# Patient Record
Sex: Male | Born: 1945 | Race: Black or African American | Hispanic: No | Marital: Married | Smoking: Former smoker
Health system: Southern US, Community
[De-identification: ages and names within clinical notes are randomized; demographics above are authoritative.]

## PROBLEM LIST (undated history)

## (undated) DIAGNOSIS — K5900 Constipation, unspecified: Secondary | ICD-10-CM

## (undated) DIAGNOSIS — I10 Essential (primary) hypertension: Secondary | ICD-10-CM

## (undated) DIAGNOSIS — M869 Osteomyelitis, unspecified: Secondary | ICD-10-CM

## (undated) DIAGNOSIS — R0602 Shortness of breath: Secondary | ICD-10-CM

## (undated) DIAGNOSIS — C801 Malignant (primary) neoplasm, unspecified: Secondary | ICD-10-CM

## (undated) DIAGNOSIS — T4145XA Adverse effect of unspecified anesthetic, initial encounter: Secondary | ICD-10-CM

## (undated) DIAGNOSIS — Z9889 Other specified postprocedural states: Secondary | ICD-10-CM

## (undated) DIAGNOSIS — M1990 Unspecified osteoarthritis, unspecified site: Secondary | ICD-10-CM

## (undated) DIAGNOSIS — N400 Enlarged prostate without lower urinary tract symptoms: Secondary | ICD-10-CM

## (undated) DIAGNOSIS — R011 Cardiac murmur, unspecified: Secondary | ICD-10-CM

## (undated) DIAGNOSIS — R112 Nausea with vomiting, unspecified: Secondary | ICD-10-CM

## (undated) DIAGNOSIS — E039 Hypothyroidism, unspecified: Secondary | ICD-10-CM

## (undated) DIAGNOSIS — E119 Type 2 diabetes mellitus without complications: Secondary | ICD-10-CM

## (undated) DIAGNOSIS — T8859XA Other complications of anesthesia, initial encounter: Secondary | ICD-10-CM

## (undated) HISTORY — PX: FRACTURE SURGERY: SHX138

## (undated) HISTORY — PX: JOINT REPLACEMENT: SHX530

## (undated) HISTORY — PX: COLONOSCOPY: SHX174

---

## 2004-09-07 ENCOUNTER — Ambulatory Visit: Payer: Self-pay | Admitting: Family Medicine

## 2004-09-08 ENCOUNTER — Ambulatory Visit: Payer: Self-pay | Admitting: Family Medicine

## 2004-09-09 ENCOUNTER — Ambulatory Visit: Payer: Self-pay | Admitting: *Deleted

## 2004-10-01 ENCOUNTER — Ambulatory Visit: Payer: Self-pay | Admitting: Family Medicine

## 2005-04-19 ENCOUNTER — Emergency Department: Payer: Self-pay | Admitting: Unknown Physician Specialty

## 2005-06-05 ENCOUNTER — Emergency Department: Payer: Self-pay | Admitting: Emergency Medicine

## 2007-05-31 ENCOUNTER — Ambulatory Visit: Payer: Self-pay | Admitting: Internal Medicine

## 2007-06-20 ENCOUNTER — Ambulatory Visit: Payer: Self-pay | Admitting: Unknown Physician Specialty

## 2007-06-30 ENCOUNTER — Ambulatory Visit: Payer: Self-pay | Admitting: Internal Medicine

## 2007-07-01 ENCOUNTER — Ambulatory Visit: Payer: Self-pay | Admitting: Internal Medicine

## 2007-07-03 ENCOUNTER — Ambulatory Visit: Payer: Self-pay | Admitting: Internal Medicine

## 2007-07-31 ENCOUNTER — Ambulatory Visit: Payer: Self-pay | Admitting: Internal Medicine

## 2007-08-31 ENCOUNTER — Ambulatory Visit: Payer: Self-pay | Admitting: Internal Medicine

## 2007-08-31 HISTORY — PX: MICROLARYNGOSCOPY WITH CO2 LASER AND EXCISION OF VOCAL CORD LESION: SHX5970

## 2007-10-01 ENCOUNTER — Ambulatory Visit: Payer: Self-pay | Admitting: Internal Medicine

## 2007-10-29 ENCOUNTER — Ambulatory Visit: Payer: Self-pay | Admitting: Radiation Oncology

## 2007-11-06 ENCOUNTER — Ambulatory Visit: Payer: Self-pay | Admitting: Radiation Oncology

## 2007-11-29 ENCOUNTER — Ambulatory Visit: Payer: Self-pay | Admitting: Radiation Oncology

## 2007-12-29 ENCOUNTER — Ambulatory Visit: Payer: Self-pay | Admitting: Radiation Oncology

## 2008-02-28 ENCOUNTER — Ambulatory Visit: Payer: Self-pay | Admitting: Radiation Oncology

## 2008-06-10 ENCOUNTER — Ambulatory Visit: Payer: Self-pay | Admitting: Radiation Oncology

## 2008-06-30 ENCOUNTER — Ambulatory Visit: Payer: Self-pay | Admitting: Radiation Oncology

## 2010-01-08 ENCOUNTER — Emergency Department: Payer: Self-pay | Admitting: Emergency Medicine

## 2010-07-08 ENCOUNTER — Ambulatory Visit: Payer: Self-pay | Admitting: Unknown Physician Specialty

## 2010-07-15 ENCOUNTER — Emergency Department: Payer: Self-pay | Admitting: Emergency Medicine

## 2010-11-08 ENCOUNTER — Observation Stay: Payer: Self-pay | Admitting: Internal Medicine

## 2011-11-08 IMAGING — CR DG ABDOMEN 3V
1 series · 4 of 4 positions shown · non-contrast
Comparison: none

REASON FOR EXAM: abdominal pain
COMMENTS:

[Series 1: view not recorded · 0.17mm/px · 4 of 4 slices shown]
[im 1/4]
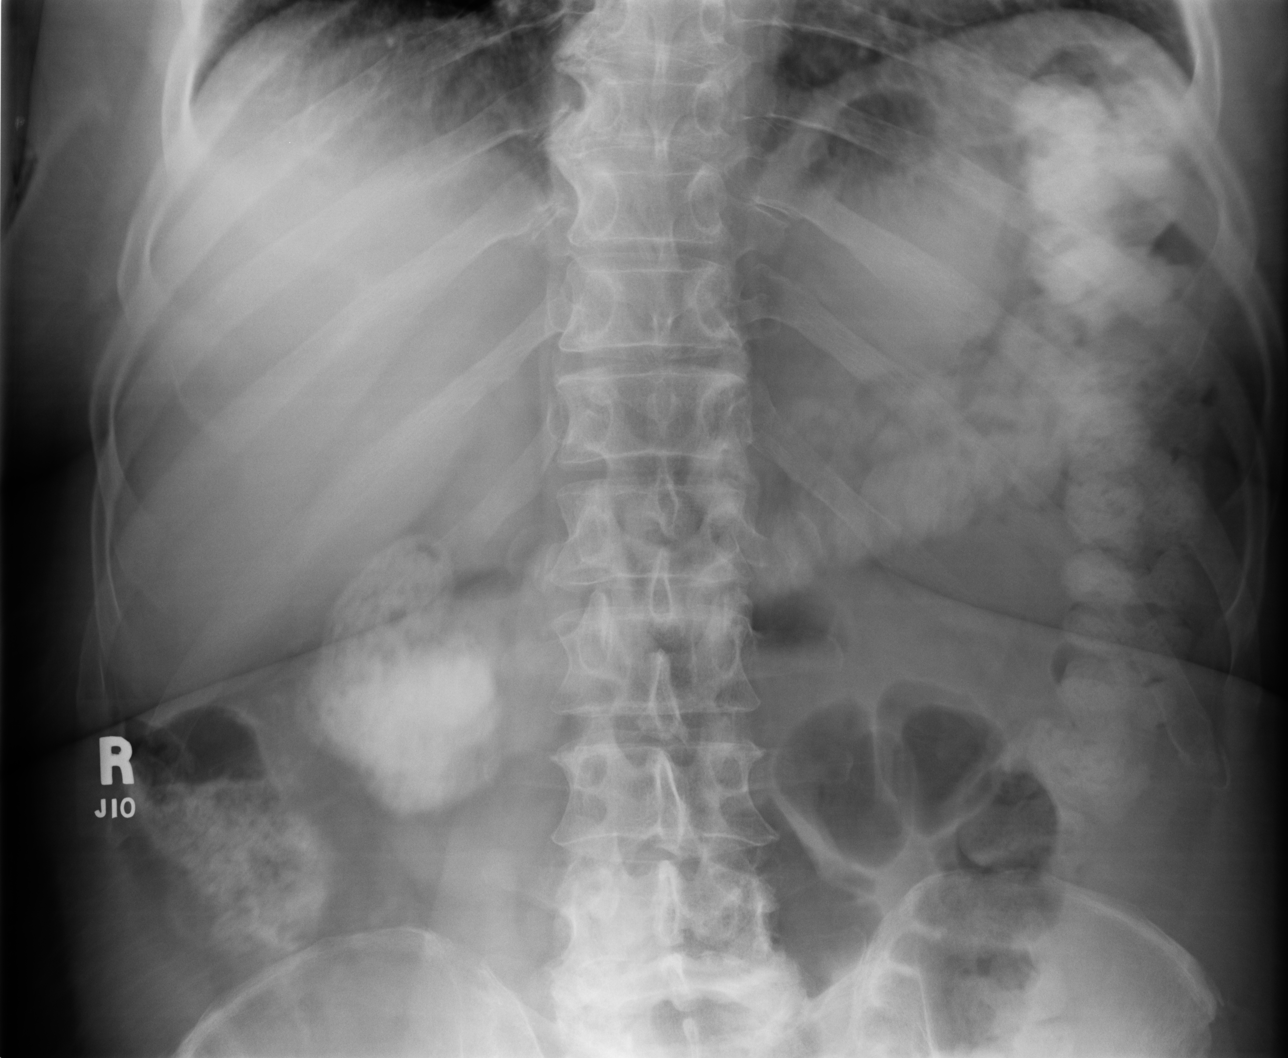
[im 2/4]
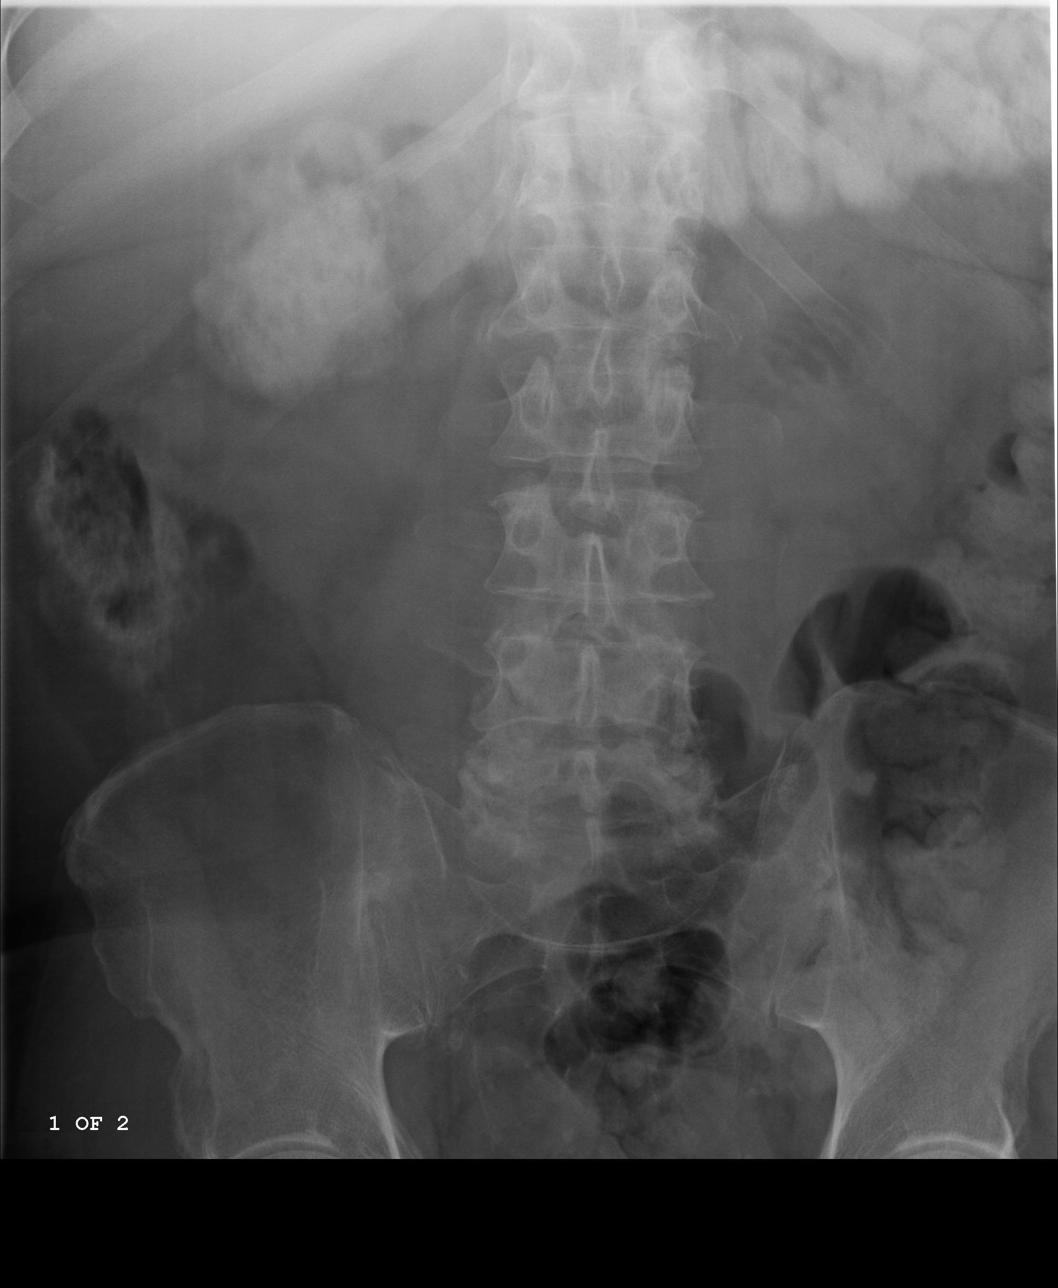
[im 3/4]
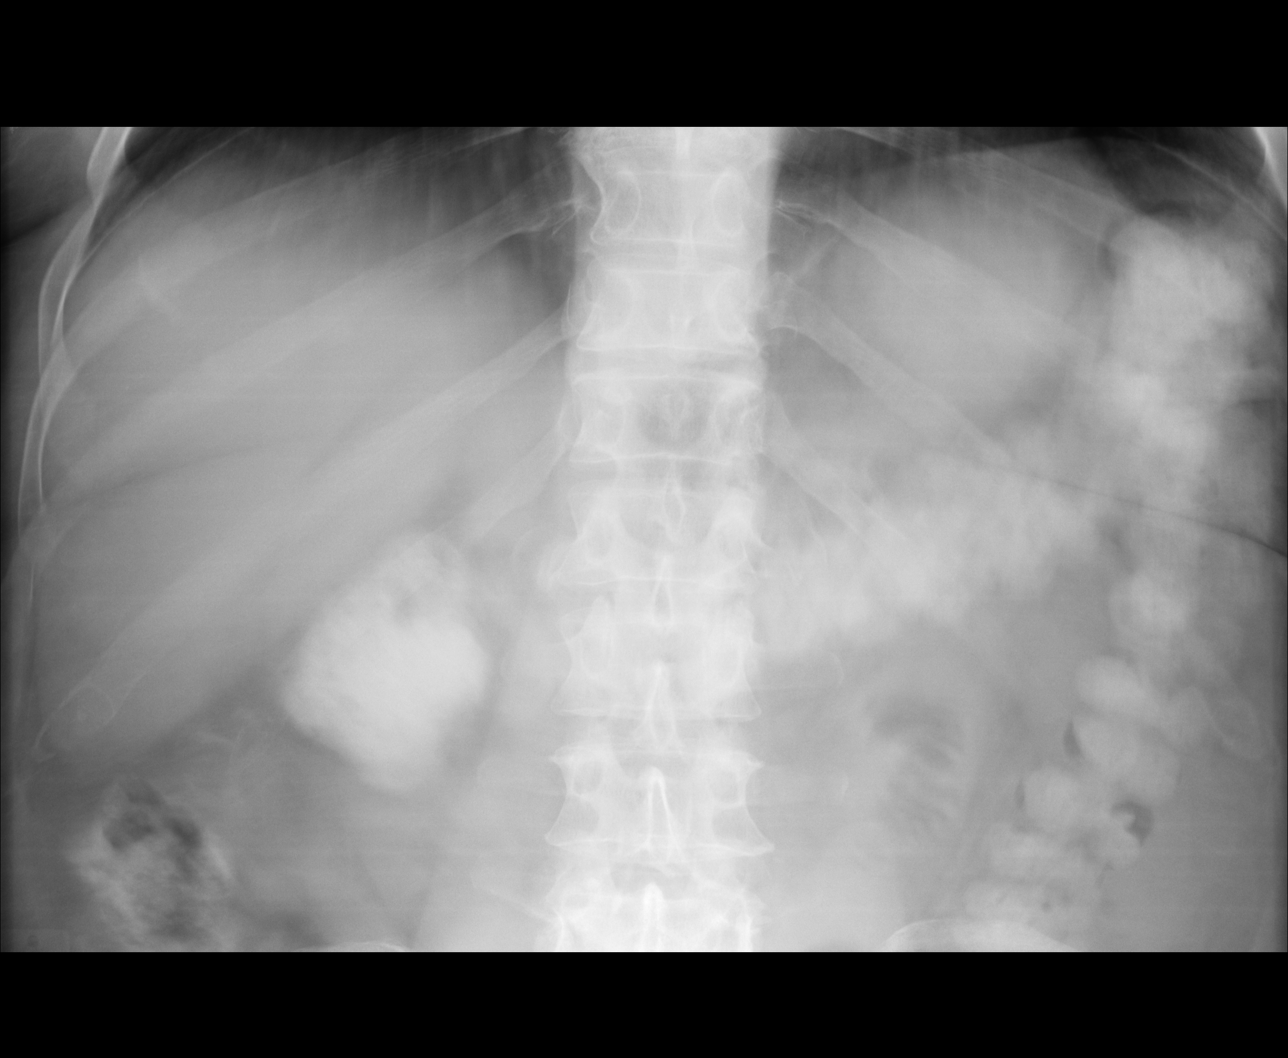
[im 4/4]
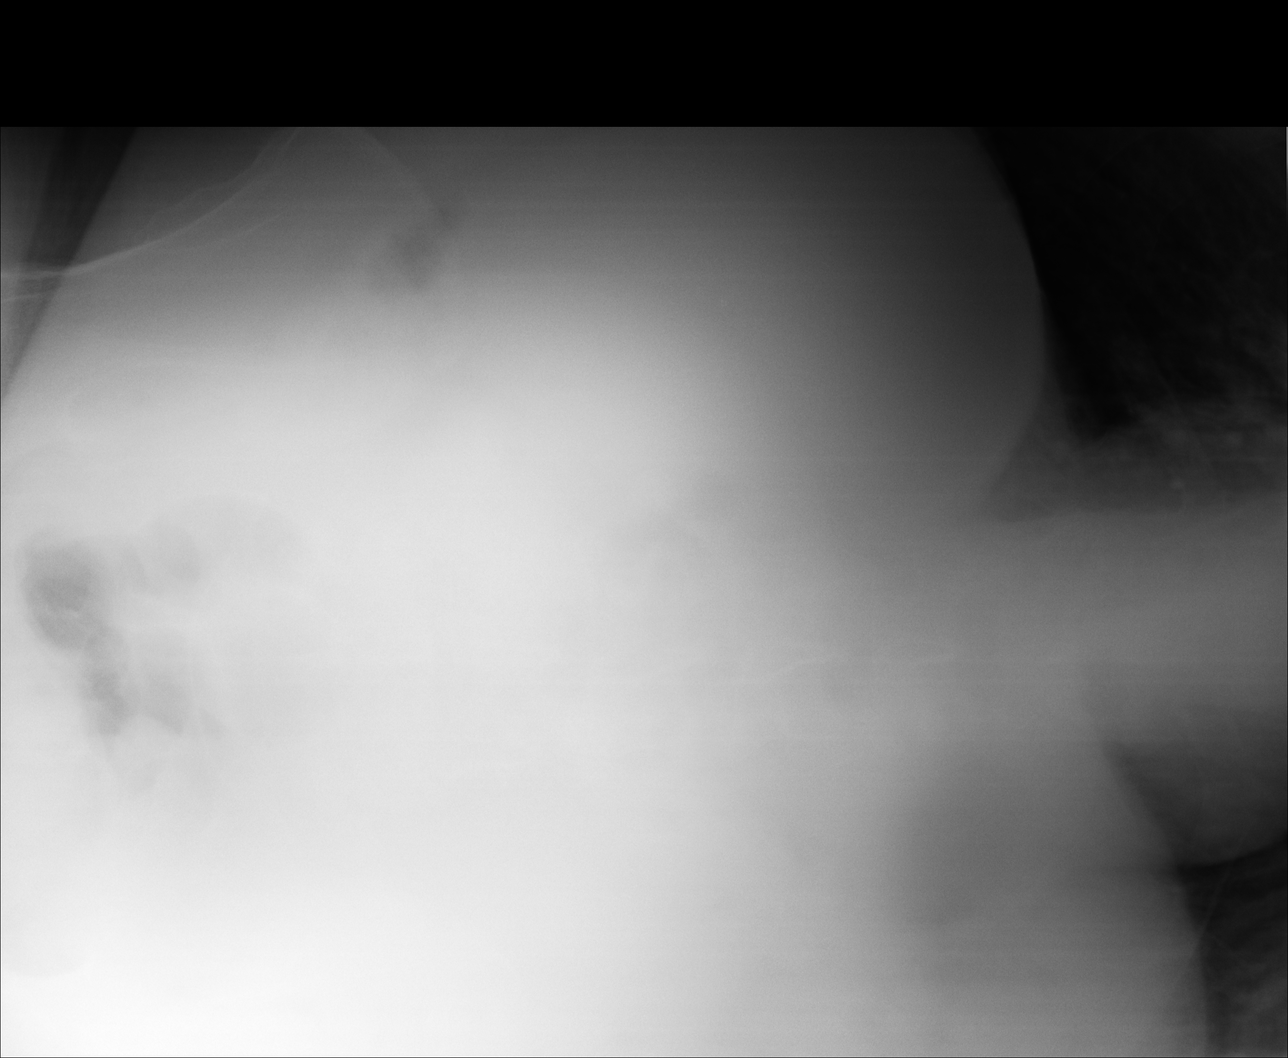

[4 of 4 positions shown; findings below may reference images not displayed]

PROCEDURE:     DXR - DXR ABDOMEN COMPLETE  - November 09, 2010  [DATE]

RESULT:     Multiple views of the abdomen were obtained. No subdiaphragmatic
free air is seen. Comparison is made to the prior exam of 11/08/2010. The
current exam shows contrast material in the colon compatible with residual
contrast from interval CT. No dilated loops of large or small bowel are
noted. The small bowel fluid levels observed previously are less prominent.
There is a moderate amount of fecal material in the descending colon. No
abnormal intraabdominal calcifications are seen.
IMPRESSION: 1. No bowel obstruction or other acute change is identified. There has been
marked improvement in the scattered small bowel fluid levels noted on the
exam of yesterday.
3. There is a small amount of contrast material in the colon compatible with
residual contrast from a interval CT.

## 2012-11-28 ENCOUNTER — Emergency Department: Payer: Self-pay | Admitting: Emergency Medicine

## 2012-11-28 LAB — URINALYSIS, COMPLETE
Bacteria: NONE SEEN
Bilirubin,UR: NEGATIVE
Nitrite: NEGATIVE
Ph: 6 (ref 4.5–8.0)
RBC,UR: 1 /HPF (ref 0–5)
Specific Gravity: 1.028 (ref 1.003–1.030)
Squamous Epithelial: <1
WBC UR: NONE SEEN /HPF (ref 0–5)

## 2012-11-28 LAB — COMPREHENSIVE METABOLIC PANEL
Albumin: 3 g/dL — ABNORMAL LOW (ref 3.4–5.0)
Anion Gap: 7 (ref 7–16)
Calcium, Total: 8.8 mg/dL (ref 8.5–10.1)
Co2: 25 mmol/L (ref 21–32)
Creatinine: 1.14 mg/dL (ref 0.60–1.30)
EGFR (Non-African Amer.): >60
Osmolality: 291 (ref 275–301)
Potassium: 4.1 mmol/L (ref 3.5–5.1)
SGOT(AST): 24 U/L (ref 15–37)
SGPT (ALT): 20 U/L (ref 12–78)
Total Protein: 7.5 g/dL (ref 6.4–8.2)

## 2012-11-28 LAB — CBC
HCT: 37.7 % — ABNORMAL LOW (ref 40.0–52.0)
MCH: 31.5 pg (ref 26.0–34.0)
MCHC: 34.2 g/dL (ref 32.0–36.0)
MCV: 92 fL (ref 80–100)
RDW: 13.3 % (ref 11.5–14.5)
WBC: 8.4 10*3/uL (ref 3.8–10.6)

## 2013-04-13 ENCOUNTER — Other Ambulatory Visit: Payer: Self-pay | Admitting: Orthopedic Surgery

## 2013-04-16 NOTE — Pre-Procedure Instructions (Signed)
DEMETRUS PAVAO  04/16/2013   Your procedure is scheduled on:  Monday, August 25th.  Report to [facility] at 10:30 AM.  Call this number if you have problems the morning of surgery: [phone]   Remember:   Do not eat food or drink liquids after midnight.   Take these medicines the morning of surgery with A SIP OF WATER: -Diltiazem, and pain med if needed   Do not wear jewelry.  Do not wear lotions, powders, or perfumes. You may wear deodorant.             Men may shave face and neck.  Do not bring valuables to the hospital.  [facility] is not responsible for any belongings or valuables.  Contacts, dentures or bridgework may not be worn into surgery.  Leave suitcase in the car. After surgery it may be brought to your room.  For patients admitted to the hospital, checkout time is 11:00 AM the day of discharge.     Special Instructions: Shower using CHG 2 nights before surgery and the night before surgery.  If you shower the day of surgery use CHG.  Use special wash - you have one bottle of CHG for all showers.  You should use approximately 1/3 of the bottle for each shower.   Please read over the following fact sheets that you were given: Pain Booklet, Coughing and Deep Breathing, Blood Transfusion Information and Surgical Site Infection Prevention

## 2013-04-17 ENCOUNTER — Ambulatory Visit (HOSPITAL_COMMUNITY)
Admission: RE | Admit: 2013-04-17 | Discharge: 2013-04-17 | Disposition: A | Discharge status: Home / Self Care | Payer: BC Managed Care – PPO | Source: Ambulatory Visit | Attending: Orthopedic Surgery | Admitting: Orthopedic Surgery

## 2013-04-17 ENCOUNTER — Encounter (HOSPITAL_COMMUNITY)
Admission: RE | Admit: 2013-04-17 | Discharge: 2013-04-17 | Disposition: A | Discharge status: Home / Self Care | Payer: BC Managed Care – PPO | Source: Ambulatory Visit | Attending: Orthopedic Surgery | Admitting: Orthopedic Surgery

## 2013-04-17 ENCOUNTER — Encounter (HOSPITAL_COMMUNITY): Payer: Self-pay

## 2013-04-17 DIAGNOSIS — Z01818 Encounter for other preprocedural examination: Secondary | ICD-10-CM | POA: Insufficient documentation

## 2013-04-17 DIAGNOSIS — Z0183 Encounter for blood typing: Secondary | ICD-10-CM | POA: Insufficient documentation

## 2013-04-17 DIAGNOSIS — M4120 Other idiopathic scoliosis, site unspecified: Secondary | ICD-10-CM | POA: Insufficient documentation

## 2013-04-17 DIAGNOSIS — E119 Type 2 diabetes mellitus without complications: Secondary | ICD-10-CM | POA: Insufficient documentation

## 2013-04-17 DIAGNOSIS — Z01811 Encounter for preprocedural respiratory examination: Secondary | ICD-10-CM | POA: Insufficient documentation

## 2013-04-17 DIAGNOSIS — Z01812 Encounter for preprocedural laboratory examination: Secondary | ICD-10-CM | POA: Insufficient documentation

## 2013-04-17 DIAGNOSIS — Z87891 Personal history of nicotine dependence: Secondary | ICD-10-CM | POA: Insufficient documentation

## 2013-04-17 DIAGNOSIS — I10 Essential (primary) hypertension: Secondary | ICD-10-CM | POA: Insufficient documentation

## 2013-04-17 HISTORY — DX: Type 2 diabetes mellitus without complications: E11.9

## 2013-04-17 HISTORY — DX: Essential (primary) hypertension: I10

## 2013-04-17 HISTORY — DX: Malignant (primary) neoplasm, unspecified: C80.1

## 2013-04-17 HISTORY — DX: Unspecified osteoarthritis, unspecified site: M19.90

## 2013-04-17 LAB — BASIC METABOLIC PANEL
BUN: 19 mg/dL (ref 6–23)
Chloride: 107 mEq/L (ref 96–112)
Creatinine, Ser: 1.05 mg/dL (ref 0.50–1.35)
GFR calc Af Amer: 84 mL/min — ABNORMAL LOW (ref >90)
GFR calc non Af Amer: 73 mL/min — ABNORMAL LOW (ref >90)
Glucose, Bld: 95 mg/dL (ref 70–99)

## 2013-04-17 LAB — ABO/RH: ABO/RH(D): B POS

## 2013-04-17 LAB — CBC WITH DIFFERENTIAL/PLATELET
Basophils Absolute: 0.1 10*3/uL (ref 0.0–0.1)
Basophils Relative: 1 % (ref 0–1)
Eosinophils Absolute: 0.2 10*3/uL (ref 0.0–0.7)
HCT: 41.7 % (ref 39.0–52.0)
Hemoglobin: 14.6 g/dL (ref 13.0–17.0)
MCH: 30.8 pg (ref 26.0–34.0)
MCHC: 35 g/dL (ref 30.0–36.0)
Monocytes Absolute: 0.8 10*3/uL (ref 0.1–1.0)
Monocytes Relative: 13 % — ABNORMAL HIGH (ref 3–12)
Neutro Abs: 2.2 10*3/uL (ref 1.7–7.7)
RDW: 13.3 % (ref 11.5–15.5)

## 2013-04-17 LAB — SURGICAL PCR SCREEN
MRSA, PCR: NEGATIVE
Staphylococcus aureus: NEGATIVE

## 2013-04-17 LAB — TYPE AND SCREEN: ABO/RH(D): B POS

## 2013-04-17 LAB — URINE MICROSCOPIC-ADD ON

## 2013-04-17 LAB — URINALYSIS, ROUTINE W REFLEX MICROSCOPIC
Glucose, UA: >1000 mg/dL — AB
Hgb urine dipstick: NEGATIVE
Leukocytes, UA: NEGATIVE
Specific Gravity, Urine: 1.025 (ref 1.005–1.030)
pH: 6 (ref 5.0–8.0)

## 2013-04-17 MED ORDER — CHLORHEXIDINE GLUCONATE 4 % EX LIQD: 60.0000 mL | Freq: Once | CUTANEOUS | Status: DC

## 2013-04-17 NOTE — Progress Notes (Signed)
Faxed the Stop-Bang Tool for obstructive sleep apnea to Dr. Sunday Corn  office. Also patient EKG is abnormal, requested from Dr Madaline Guthrie a comparison EKG, or any cardiac studies.

## 2013-04-18 NOTE — Progress Notes (Signed)
Anesthesia Chart Review:  Patient is a 67 year old male scheduled for left TKA on 04/23/13 by Dr. Turner Daniels.  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  OSA screening score was 4.  PCP is Dr. Toy Cookey.  EKG on 04/17/13 showed NSR, possible inferior infarct (age undetermined).  No prior cardiac studies at his PCP office, but I did receive a comparison EKG from [facility] done on 11/28/12 that showed NSR, but with low r waves in inferior leads III and aVF.  I think this EKG appears similar to his EKG from 04/17/13.  Also the notes from [facility] indicate that this EKG was not significantly changed from a prior EKG on 11/08/10.  CXR on 04/17/13 showed mild bronchitic changes.   Preoperative labs noted.  Based on noted from [facility], his EKG has been stable sine 2012.  No CV symptoms were documented at his PAT visit.  If no new symptoms then anticipate that he can proceed as planned.  Anesthesiologist Dr.Joslin agrees with this plan.  Velna Ochs [facility]/Anesthesiology Phone [phone] 04/18/2013 6:28 PM

## 2013-04-20 NOTE — Progress Notes (Signed)
Message left on answering machine concerning time change, pt instructed to be here at 0800 Monday.  Pt. Ask to call back and let us know that he received message.
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
## 2013-04-22 MED ORDER — DEXTROSE 5 % IV SOLN
3.0000 g | INTRAVENOUS | Status: AC
  Administered 2013-04-23: 3 g via INTRAVENOUS
  Filled 2013-04-22: qty 3000

## 2013-04-22 MED ORDER — DEXTROSE-NACL 5-0.45 % IV SOLN: INTRAVENOUS | Status: DC

## 2013-04-23 ENCOUNTER — Encounter (HOSPITAL_COMMUNITY)
Admission: RE | Disposition: A | Discharge status: Skilled Nursing Facility | Payer: Self-pay | Source: Ambulatory Visit | Attending: Orthopedic Surgery

## 2013-04-23 ENCOUNTER — Inpatient Hospital Stay (HOSPITAL_COMMUNITY): Payer: BC Managed Care – PPO | Admitting: Registered Nurse

## 2013-04-23 ENCOUNTER — Encounter (HOSPITAL_COMMUNITY): Payer: Self-pay | Admitting: Vascular Surgery

## 2013-04-23 ENCOUNTER — Encounter (HOSPITAL_COMMUNITY): Payer: Self-pay | Admitting: Anesthesiology

## 2013-04-23 ENCOUNTER — Inpatient Hospital Stay (HOSPITAL_COMMUNITY)
Admission: RE | Admit: 2013-04-23 | Discharge: 2013-04-25 | DRG: 209 | Disposition: A | Discharge status: Skilled Nursing Facility | Payer: BC Managed Care – PPO | Source: Ambulatory Visit | Attending: Orthopedic Surgery | Admitting: Orthopedic Surgery

## 2013-04-23 DIAGNOSIS — N400 Enlarged prostate without lower urinary tract symptoms: Secondary | ICD-10-CM | POA: Diagnosis present

## 2013-04-23 DIAGNOSIS — Z7982 Long term (current) use of aspirin: Secondary | ICD-10-CM

## 2013-04-23 DIAGNOSIS — Z87891 Personal history of nicotine dependence: Secondary | ICD-10-CM

## 2013-04-23 DIAGNOSIS — Z79899 Other long term (current) drug therapy: Secondary | ICD-10-CM

## 2013-04-23 DIAGNOSIS — M1710 Unilateral primary osteoarthritis, unspecified knee: Principal | ICD-10-CM | POA: Diagnosis present

## 2013-04-23 DIAGNOSIS — E119 Type 2 diabetes mellitus without complications: Secondary | ICD-10-CM | POA: Diagnosis present

## 2013-04-23 DIAGNOSIS — M1712 Unilateral primary osteoarthritis, left knee: Secondary | ICD-10-CM | POA: Diagnosis present

## 2013-04-23 DIAGNOSIS — I10 Essential (primary) hypertension: Secondary | ICD-10-CM | POA: Diagnosis present

## 2013-04-23 DIAGNOSIS — Z794 Long term (current) use of insulin: Secondary | ICD-10-CM

## 2013-04-23 HISTORY — PX: TOTAL KNEE ARTHROPLASTY: SHX125

## 2013-04-23 LAB — GLUCOSE, CAPILLARY
Glucose-Capillary: 116 mg/dL — ABNORMAL HIGH (ref 70–99)
Glucose-Capillary: 106 mg/dL — ABNORMAL HIGH (ref 70–99)

## 2013-04-23 SURGERY — ARTHROPLASTY, KNEE, TOTAL
Anesthesia: Choice | Site: Knee | Laterality: Left | Wound class: Clean

## 2013-04-23 MED ORDER — DIPHENHYDRAMINE HCL 12.5 MG/5ML PO ELIX: 12.5000 mg | ORAL_SOLUTION | ORAL | Status: DC | PRN

## 2013-04-23 MED ORDER — ACETAMINOPHEN 325 MG PO TABS
650.0000 mg | ORAL_TABLET | Freq: Four times a day (QID) | ORAL | Status: DC | PRN
  Administered 2013-04-24 – 2013-04-25 (×2): 650 mg via ORAL
  Filled 2013-04-23 (×2): qty 2

## 2013-04-23 MED ORDER — ARTIFICIAL TEARS OP OINT
TOPICAL_OINTMENT | OPHTHALMIC | Status: DC | PRN
  Administered 2013-04-23: 1 via OPHTHALMIC

## 2013-04-23 MED ORDER — FENTANYL CITRATE 0.05 MG/ML IJ SOLN
INTRAMUSCULAR | Status: AC
  Administered 2013-04-23: 50 ug via INTRAVENOUS
  Filled 2013-04-23: qty 2

## 2013-04-23 MED ORDER — MIDAZOLAM HCL 2 MG/2ML IJ SOLN
INTRAMUSCULAR | Status: AC
  Administered 2013-04-23: 2 mg
  Filled 2013-04-23: qty 2

## 2013-04-23 MED ORDER — BISACODYL 5 MG PO TBEC: 5.0000 mg | DELAYED_RELEASE_TABLET | Freq: Every day | ORAL | Status: DC | PRN

## 2013-04-23 MED ORDER — TAMSULOSIN HCL 0.4 MG PO CAPS
0.4000 mg | ORAL_CAPSULE | Freq: Every day | ORAL | Status: DC
  Administered 2013-04-23 – 2013-04-25 (×3): 0.4 mg via ORAL
  Filled 2013-04-23 (×3): qty 1

## 2013-04-23 MED ORDER — BUPIVACAINE-EPINEPHRINE PF 0.5-1:200000 % IJ SOLN
INTRAMUSCULAR | Status: DC | PRN
  Administered 2013-04-23: 20 mL

## 2013-04-23 MED ORDER — PROPOFOL 10 MG/ML IV BOLUS
INTRAVENOUS | Status: DC | PRN
  Administered 2013-04-23: 50 mg via INTRAVENOUS

## 2013-04-23 MED ORDER — LACTATED RINGERS IV SOLN
INTRAVENOUS | Status: DC
  Administered 2013-04-23: 11:00:00 via INTRAVENOUS

## 2013-04-23 MED ORDER — SENNOSIDES-DOCUSATE SODIUM 8.6-50 MG PO TABS: 1.0000 | ORAL_TABLET | Freq: Every evening | ORAL | Status: DC | PRN

## 2013-04-23 MED ORDER — FENTANYL CITRATE 0.05 MG/ML IJ SOLN
INTRAMUSCULAR | Status: DC | PRN
  Administered 2013-04-23: 100 ug via INTRAVENOUS
  Administered 2013-04-23 (×3): 50 ug via INTRAVENOUS

## 2013-04-23 MED ORDER — HYDROMORPHONE HCL PF 1 MG/ML IJ SOLN
0.2500 mg | INTRAMUSCULAR | Status: DC | PRN
  Administered 2013-04-23: 0.5 mg via INTRAVENOUS

## 2013-04-23 MED ORDER — HYDROMORPHONE HCL PF 1 MG/ML IJ SOLN
1.0000 mg | INTRAMUSCULAR | Status: DC | PRN
  Administered 2013-04-23 – 2013-04-24 (×2): 1 mg via INTRAVENOUS
  Filled 2013-04-23 (×2): qty 1

## 2013-04-23 MED ORDER — KCL IN DEXTROSE-NACL 20-5-0.45 MEQ/L-%-% IV SOLN
INTRAVENOUS | Status: DC
  Administered 2013-04-23 – 2013-04-25 (×2): via INTRAVENOUS
  Filled 2013-04-23 (×9): qty 1000

## 2013-04-23 MED ORDER — ONDANSETRON HCL 4 MG PO TABS: 4.0000 mg | ORAL_TABLET | Freq: Four times a day (QID) | ORAL | Status: DC | PRN

## 2013-04-23 MED ORDER — INSULIN ASPART 100 UNIT/ML ~~LOC~~ SOLN
0.0000 [IU] | Freq: Three times a day (TID) | SUBCUTANEOUS | Status: DC
  Administered 2013-04-24: 5 [IU] via SUBCUTANEOUS
  Administered 2013-04-24 – 2013-04-25 (×3): 3 [IU] via SUBCUTANEOUS

## 2013-04-23 MED ORDER — 0.9 % SODIUM CHLORIDE (POUR BTL) OPTIME
TOPICAL | Status: DC | PRN
  Administered 2013-04-23: 1000 mL

## 2013-04-23 MED ORDER — METHOCARBAMOL 500 MG PO TABS
500.0000 mg | ORAL_TABLET | Freq: Four times a day (QID) | ORAL | Status: DC | PRN
  Administered 2013-04-23 – 2013-04-25 (×4): 500 mg via ORAL
  Filled 2013-04-23 (×4): qty 1

## 2013-04-23 MED ORDER — ONDANSETRON HCL 4 MG/2ML IJ SOLN
INTRAMUSCULAR | Status: DC | PRN
  Administered 2013-04-23: 4 mg via INTRAVENOUS

## 2013-04-23 MED ORDER — SODIUM CHLORIDE 0.9 % IJ SOLN
INTRAMUSCULAR | Status: DC | PRN
  Administered 2013-04-23: 13:00:00

## 2013-04-23 MED ORDER — PHENOL 1.4 % MT LIQD: 1.0000 | OROMUCOSAL | Status: DC | PRN

## 2013-04-23 MED ORDER — ONDANSETRON HCL 4 MG/2ML IJ SOLN
4.0000 mg | Freq: Four times a day (QID) | INTRAMUSCULAR | Status: DC | PRN
  Administered 2013-04-24: 4 mg via INTRAVENOUS
  Filled 2013-04-23: qty 2

## 2013-04-23 MED ORDER — ONDANSETRON HCL 4 MG/2ML IJ SOLN: 4.0000 mg | Freq: Once | INTRAMUSCULAR | Status: DC | PRN

## 2013-04-23 MED ORDER — MENTHOL 3 MG MT LOZG: 1.0000 | LOZENGE | OROMUCOSAL | Status: DC | PRN

## 2013-04-23 MED ORDER — METOCLOPRAMIDE HCL 10 MG PO TABS: 5.0000 mg | ORAL_TABLET | Freq: Three times a day (TID) | ORAL | Status: DC | PRN

## 2013-04-23 MED ORDER — ROCURONIUM BROMIDE 100 MG/10ML IV SOLN
INTRAVENOUS | Status: DC | PRN
  Administered 2013-04-23: 50 mg via INTRAVENOUS

## 2013-04-23 MED ORDER — DILTIAZEM HCL ER BEADS 300 MG PO CP24
300.0000 mg | ORAL_CAPSULE | Freq: Every day | ORAL | Status: DC
  Administered 2013-04-23 – 2013-04-25 (×3): 300 mg via ORAL
  Filled 2013-04-23 (×3): qty 1

## 2013-04-23 MED ORDER — ASPIRIN EC 325 MG PO TBEC
325.0000 mg | DELAYED_RELEASE_TABLET | Freq: Every day | ORAL | Status: DC
  Administered 2013-04-24 – 2013-04-25 (×2): 325 mg via ORAL
  Filled 2013-04-23 (×3): qty 1

## 2013-04-23 MED ORDER — MIDAZOLAM HCL 5 MG/5ML IJ SOLN
INTRAMUSCULAR | Status: DC | PRN
  Administered 2013-04-23: 1 mg via INTRAVENOUS

## 2013-04-23 MED ORDER — METOCLOPRAMIDE HCL 5 MG/ML IJ SOLN: 5.0000 mg | Freq: Three times a day (TID) | INTRAMUSCULAR | Status: DC | PRN

## 2013-04-23 MED ORDER — ALUM & MAG HYDROXIDE-SIMETH 200-200-20 MG/5ML PO SUSP
30.0000 mL | ORAL | Status: DC | PRN
  Administered 2013-04-24 (×2): 30 mL via ORAL
  Filled 2013-04-23 (×2): qty 30

## 2013-04-23 MED ORDER — LISINOPRIL 20 MG PO TABS
20.0000 mg | ORAL_TABLET | Freq: Every day | ORAL | Status: DC
  Administered 2013-04-23 – 2013-04-25 (×3): 20 mg via ORAL
  Filled 2013-04-23 (×3): qty 1

## 2013-04-23 MED ORDER — MIDAZOLAM HCL 5 MG/ML IJ SOLN: 2.0000 mg | Freq: Once | INTRAMUSCULAR | Status: DC

## 2013-04-23 MED ORDER — OXYCODONE HCL 5 MG PO TABS: 5.0000 mg | ORAL_TABLET | Freq: Once | ORAL | Status: DC | PRN

## 2013-04-23 MED ORDER — MEPERIDINE HCL 25 MG/ML IJ SOLN: 6.2500 mg | INTRAMUSCULAR | Status: DC | PRN

## 2013-04-23 MED ORDER — HYDROMORPHONE HCL PF 1 MG/ML IJ SOLN
INTRAMUSCULAR | Status: AC
  Administered 2013-04-23: 0.5 mg via INTRAVENOUS
  Filled 2013-04-23: qty 1

## 2013-04-23 MED ORDER — OXYCODONE HCL 5 MG/5ML PO SOLN: 5.0000 mg | Freq: Once | ORAL | Status: DC | PRN

## 2013-04-23 MED ORDER — CEFUROXIME SODIUM 1.5 G IJ SOLR
INTRAMUSCULAR | Status: DC | PRN
  Administered 2013-04-23: 1.5 g

## 2013-04-23 MED ORDER — KCL IN DEXTROSE-NACL 20-5-0.45 MEQ/L-%-% IV SOLN
INTRAVENOUS | Status: AC
  Filled 2013-04-23: qty 1000

## 2013-04-23 MED ORDER — ACETAMINOPHEN 650 MG RE SUPP: 650.0000 mg | Freq: Four times a day (QID) | RECTAL | Status: DC | PRN

## 2013-04-23 MED ORDER — BUPIVACAINE LIPOSOME 1.3 % IJ SUSP
20.0000 mL | INTRAMUSCULAR | Status: DC
  Filled 2013-04-23: qty 20

## 2013-04-23 MED ORDER — METHOCARBAMOL 100 MG/ML IJ SOLN
500.0000 mg | Freq: Four times a day (QID) | INTRAMUSCULAR | Status: DC | PRN
  Filled 2013-04-23: qty 5

## 2013-04-23 MED ORDER — SODIUM CHLORIDE 0.9 % IR SOLN
Status: DC | PRN
  Administered 2013-04-23: 3000 mL

## 2013-04-23 MED ORDER — DAPAGLIFLOZIN PROPANEDIOL 5 MG PO TABS: 5.0000 mg | ORAL_TABLET | Freq: Every day | ORAL | Status: DC

## 2013-04-23 MED ORDER — MAGNESIUM CITRATE PO SOLN: 1.0000 | Freq: Once | ORAL | Status: AC | PRN

## 2013-04-23 MED ORDER — TRANEXAMIC ACID 100 MG/ML IV SOLN
1000.0000 mg | INTRAVENOUS | Status: AC
  Administered 2013-04-23: 1000 mg via INTRAVENOUS
  Filled 2013-04-23: qty 10

## 2013-04-23 MED ORDER — OXYCODONE HCL 5 MG PO TABS
5.0000 mg | ORAL_TABLET | ORAL | Status: DC | PRN
  Administered 2013-04-23 – 2013-04-25 (×6): 10 mg via ORAL
  Filled 2013-04-23 (×4): qty 2
  Filled 2013-04-23: qty 1
  Filled 2013-04-23 (×2): qty 2

## 2013-04-23 MED ORDER — CEFUROXIME SODIUM 1.5 G IJ SOLR
INTRAMUSCULAR | Status: AC
  Filled 2013-04-23: qty 1.5

## 2013-04-23 MED ORDER — DOCUSATE SODIUM 100 MG PO CAPS
100.0000 mg | ORAL_CAPSULE | Freq: Two times a day (BID) | ORAL | Status: DC
  Administered 2013-04-23 – 2013-04-25 (×4): 100 mg via ORAL
  Filled 2013-04-23 (×4): qty 1

## 2013-04-23 MED ORDER — LACTATED RINGERS IV SOLN
INTRAVENOUS | Status: DC | PRN
  Administered 2013-04-23 (×3): via INTRAVENOUS

## 2013-04-23 MED ORDER — FENTANYL CITRATE 0.05 MG/ML IJ SOLN
50.0000 ug | Freq: Once | INTRAMUSCULAR | Status: AC
Start: 2013-04-23 — End: 2013-04-23

## 2013-04-23 MED ORDER — LIDOCAINE HCL (CARDIAC) 20 MG/ML IV SOLN
INTRAVENOUS | Status: DC | PRN
  Administered 2013-04-23: 100 mg via INTRAVENOUS

## 2013-04-23 SURGICAL SUPPLY — 61 items
BANDAGE ELASTIC 6 VELCRO ST LF (GAUZE/BANDAGES/DRESSINGS) ×1 IMPLANT
BANDAGE ESMARK 6X9 LF (GAUZE/BANDAGES/DRESSINGS) ×1 IMPLANT
BLADE SAG 18X100X1.27 (BLADE) ×2 IMPLANT
BLADE SAW SGTL 13X75X1.27 (BLADE) ×2 IMPLANT
BLADE SURG ROTATE 9660 (MISCELLANEOUS) IMPLANT
BNDG CMPR 9X6 STRL LF SNTH (GAUZE/BANDAGES/DRESSINGS) ×1
BNDG CMPR MED 10X6 ELC LF (GAUZE/BANDAGES/DRESSINGS) ×1
BNDG ELASTIC 6X10 VLCR STRL LF (GAUZE/BANDAGES/DRESSINGS) ×2 IMPLANT
BNDG ESMARK 6X9 LF (GAUZE/BANDAGES/DRESSINGS) ×2
BOWL SMART MIX CTS (DISPOSABLE) ×2 IMPLANT
CAPT RP KNEE ×1 IMPLANT
CEMENT HV SMART SET (Cement) ×4 IMPLANT
CLOTH BEACON ORANGE TIMEOUT ST (SAFETY) ×2 IMPLANT
COVER SURGICAL LIGHT HANDLE (MISCELLANEOUS) ×2 IMPLANT
CUFF TOURNIQUET SINGLE 34IN LL (TOURNIQUET CUFF) IMPLANT
CUFF TOURNIQUET SINGLE 44IN (TOURNIQUET CUFF) IMPLANT
DRAPE EXTREMITY T 121X128X90 (DRAPE) ×2 IMPLANT
DRAPE U-SHAPE 47X51 STRL (DRAPES) ×2 IMPLANT
DURAPREP 26ML APPLICATOR (WOUND CARE) ×2 IMPLANT
ELECT REM PT RETURN 9FT ADLT (ELECTROSURGICAL) ×2
ELECTRODE REM PT RTRN 9FT ADLT (ELECTROSURGICAL) ×1 IMPLANT
EVACUATOR 1/8 PVC DRAIN (DRAIN) ×2 IMPLANT
GAUZE XEROFORM 1X8 LF (GAUZE/BANDAGES/DRESSINGS) ×2 IMPLANT
GLOVE BIO SURGEON STRL SZ 6.5 (GLOVE) ×2 IMPLANT
GLOVE BIO SURGEON STRL SZ7.5 (GLOVE) ×2 IMPLANT
GLOVE BIO SURGEON STRL SZ8.5 (GLOVE) ×4 IMPLANT
GLOVE BIOGEL M 6.5 STRL (GLOVE) ×2 IMPLANT
GLOVE BIOGEL PI IND STRL 8 (GLOVE) ×2 IMPLANT
GLOVE BIOGEL PI IND STRL 9 (GLOVE) ×1 IMPLANT
GLOVE BIOGEL PI INDICATOR 8 (GLOVE) ×2
GLOVE BIOGEL PI INDICATOR 9 (GLOVE) ×1
GOWN PREVENTION PLUS XLARGE (GOWN DISPOSABLE) ×2 IMPLANT
GOWN STRL NON-REIN LRG LVL3 (GOWN DISPOSABLE) ×2 IMPLANT
GOWN STRL REIN XL XLG (GOWN DISPOSABLE) ×4 IMPLANT
HANDPIECE INTERPULSE COAX TIP (DISPOSABLE) ×2
HOOD PEEL AWAY FACE SHEILD DIS (HOOD) ×6 IMPLANT
IMMOBILIZER KNEE 24 THIGH 36 (MISCELLANEOUS) IMPLANT
IMMOBILIZER KNEE 24 UNIV (MISCELLANEOUS) ×2
KIT BASIN OR (CUSTOM PROCEDURE TRAY) ×2 IMPLANT
KIT ROOM TURNOVER OR (KITS) ×2 IMPLANT
MANIFOLD NEPTUNE II (INSTRUMENTS) ×2 IMPLANT
NS IRRIG 1000ML POUR BTL (IV SOLUTION) ×2 IMPLANT
PACK TOTAL JOINT (CUSTOM PROCEDURE TRAY) ×2 IMPLANT
PAD ARMBOARD 7.5X6 YLW CONV (MISCELLANEOUS) ×4 IMPLANT
PADDING CAST COTTON 6X4 STRL (CAST SUPPLIES) ×2 IMPLANT
SET HNDPC FAN SPRY TIP SCT (DISPOSABLE) ×1 IMPLANT
SPONGE GAUZE 4X4 12PLY (GAUZE/BANDAGES/DRESSINGS) ×3 IMPLANT
STAPLER VISISTAT 35W (STAPLE) ×2 IMPLANT
SUCTION FRAZIER TIP 10 FR DISP (SUCTIONS) ×2 IMPLANT
SUT VIC AB 0 CTX 36 (SUTURE) ×2
SUT VIC AB 0 CTX36XBRD ANTBCTR (SUTURE) ×1 IMPLANT
SUT VIC AB 1 CTX 36 (SUTURE) ×2
SUT VIC AB 1 CTX36XBRD ANBCTR (SUTURE) ×1 IMPLANT
SUT VIC AB 2-0 CT1 27 (SUTURE) ×4
SUT VIC AB 2-0 CT1 TAPERPNT 27 (SUTURE) ×1 IMPLANT
SWAB CULTURE LIQUID MINI MALE (MISCELLANEOUS) ×1 IMPLANT
TOWEL OR 17X24 6PK STRL BLUE (TOWEL DISPOSABLE) ×2 IMPLANT
TOWEL OR 17X26 10 PK STRL BLUE (TOWEL DISPOSABLE) ×2 IMPLANT
TRAY FOLEY CATH 16FRSI W/METER (SET/KITS/TRAYS/PACK) IMPLANT
TUBE ANAEROBIC SPECIMEN COL (MISCELLANEOUS) ×1 IMPLANT
WATER STERILE IRR 1000ML POUR (IV SOLUTION) ×4 IMPLANT

## 2013-04-23 NOTE — Transfer of Care (Signed)
Immediate Anesthesia Transfer of Care Note  Patient: Edwin Shaffer  Procedure(s) Performed: Procedure(s): TOTAL KNEE ARTHROPLASTY (Left)  Patient Location: PACU  Anesthesia Type:General  Level of Consciousness: sedated, patient cooperative and responds to stimulation  Airway & Oxygen Therapy: Patient Spontanous Breathing and Patient connected to face mask oxygen  Post-op Assessment: Report given to PACU RN and Post -op Vital signs reviewed and stable  Post vital signs: Reviewed and stable  Complications: No apparent anesthesia complications

## 2013-04-23 NOTE — Anesthesia Preprocedure Evaluation (Addendum)
Anesthesia Evaluation  Patient identified by MRN, date of birth, ID band Patient awake    Reviewed: Allergy & Precautions, H&P , Patient's Chart, lab work & pertinent test results  Airway Mallampati: I TM Distance: >3 FB Neck ROM: Full    Dental   Pulmonary          Cardiovascular hypertension, Pt. on medications     Neuro/Psych    GI/Hepatic   Endo/Other  diabetes, Well Controlled, Type 2, Insulin Dependent  Renal/GU      Musculoskeletal   Abdominal   Peds  Hematology   Anesthesia Other Findings   Reproductive/Obstetrics                           Anesthesia Physical Anesthesia Plan  ASA: II  Anesthesia Plan: General   Post-op Pain Management:    Induction: Intravenous  Airway Management Planned: LMA  Additional Equipment:   Intra-op Plan:   Post-operative Plan: Extubation in OR  Informed Consent: I have reviewed the patients History and Physical, chart, labs and discussed the procedure including the risks, benefits and alternatives for the proposed anesthesia with the patient or authorized representative who has indicated his/her understanding and acceptance.   Dental advisory given  Plan Discussed with: CRNA and Surgeon  Anesthesia Plan Comments:        Anesthesia Quick Evaluation

## 2013-04-23 NOTE — Anesthesia Procedure Notes (Addendum)
Performed by: Cathie Olden B   Procedure Name: Intubation Date/Time: 04/23/2013 11:56 AM Performed by: Sherie Don Pre-anesthesia Checklist: Patient identified, Emergency Drugs available, Suction available, Patient being monitored and Timeout performed Patient Re-evaluated:Patient Re-evaluated prior to inductionOxygen Delivery Method: Circle system utilized Preoxygenation: Pre-oxygenation with 100% oxygen Intubation Type: IV induction Ventilation: Mask ventilation without difficulty Laryngoscope Size: Mac and 4 Grade View: Grade I Tube type: Oral Tube size: 7.5 mm Number of attempts: 1 Airway Equipment and Method: Stylet Placement Confirmation: ETT inserted through vocal cords under direct vision,  positive ETCO2 and breath sounds checked- equal and bilateral Secured at: 23 cm Tube secured with: Tape Dental Injury: Teeth and Oropharynx as per pre-operative assessment     Left Adductor Canal Block (1100am-1110am)  After informed consent obtained and timeout performed, inner thigh prepped. Ultrasound imaging of the Femoral Artery was performed and nerve in the Adductor Canal was identified. 20cc of local anesthetic was injected and observed bathing the nerve. No problems encountered. Image placed on the chart.  Arta Bruce MD

## 2013-04-23 NOTE — Interval H&P Note (Signed)
History and Physical Interval Note:  04/23/2013 11:29 AM  Edwin Shaffer  has presented today for surgery, with the diagnosis of LEFT KNEE SEVERE DEGENERATIVE JOINT DISEASE  The various methods of treatment have been discussed with the patient and family. After consideration of risks, benefits and other options for treatment, the patient has consented to  Procedure(s): TOTAL KNEE ARTHROPLASTY (Left) as a surgical intervention .  The patient's history has been reviewed, patient examined, no change in status, stable for surgery.  I have reviewed the patient's chart and labs.  Questions were answered to the patient's satisfaction.     [name]

## 2013-04-23 NOTE — Op Note (Signed)
PATIENT ID:      JAIDAN PREVETTE  MRN:     [id number] DOB/AGE:    Jun 05, 1946 / 67 y.o.       OPERATIVE REPORT    DATE OF PROCEDURE:  04/23/2013       PREOPERATIVE DIAGNOSIS:   LEFT KNEE SEVERE DEGENERATIVE JOINT DISEASE      There is no weight on file to calculate BMI.                                                        POSTOPERATIVE DIAGNOSIS:   LEFT KNEE SEVERE DEGENERATIVE JOINT                                                                       PROCEDURE:  Procedure(s): TOTAL KNEE ARTHROPLASTY Using Depuy Sigma RP implants #5L Femur, #5Tibia, 10mm sigma RP bearing, 41 Patella     SURGEON: Shoni Quijas J    ASSISTANT:   Eric K. [employer]   (Present and scrubbed throughout the case, critical for assistance with exposure, retraction, instrumentation, and closure.)         ANESTHESIA: GET Exparel  DRAINS: foley, 2 medium hemovac in knee   TOURNIQUET TIME:   COMPLICATIONS:  None     SPECIMENS: None   INDICATIONS FOR PROCEDURE: The patient has  LEFT KNEE SEVERE DEGENERATIVE JOINT DISEASE, varus deformities, XR shows bone on bone arthritis. Patient has failed all conservative measures including anti-inflammatory medicines, narcotics, attempts at  exercise and weight loss, cortisone injections and viscosupplementation.  Risks and benefits of surgery have been discussed, questions answered.   DESCRIPTION OF PROCEDURE: The patient identified by armband, received  IV antibiotics, in the holding area at [facility]. Patient taken to the operating room, appropriate anesthetic  monitors were attached, and general endotracheal anesthesia induced with  the patient in supine position, Foley catheter was inserted. Tourniquet  applied high to the operative thigh. Lateral post and foot positioner  applied to the table, the lower extremity was then prepped and draped  in usual sterile fashion from the ankle to the tourniquet. Time-out procedure was performed. The limb was  wrapped with an Esmarch bandage and the tourniquet inflated to 350 mmHg. We began the operation by making the anterior midline incision starting at handbreadth above the patella going over the patella 1 cm medial to and  4 cm distal to the tibial tubercle. Small bleeders in the skin and the  subcutaneous tissue identified and cauterized. Transverse retinaculum was incised and reflected medially and a medial parapatellar arthrotomy was accomplished. the patella was everted and theprepatellar fat pad resected. The superficial medial collateral  ligament was then elevated from anterior to posterior along the proximal  flare of the tibia and anterior half of the menisci resected. The knee was hyperflexed exposing bone on bone arthritis. Peripheral and notch osteophytes as well as the cruciate ligaments were then resected. We continued to  work our way around posteriorly along the proximal tibia, and externally  rotated the tibia subluxing it out from underneath the  femur. A McHale  retractor was placed through the notch and a lateral Hohmann retractor  placed, and we then drilled through the proximal tibia in line with the  axis of the tibia followed by an intramedullary guide rod and 2-degree  posterior slope cutting guide. The tibial cutting guide was pinned into place  allowing resection of 2 mm of bone medially and about 11 mm of bone  laterally because of her varus deformity. Satisfied with the tibial resection, we then  entered the distal femur 2 mm anterior to the PCL origin with the  intramedullary guide rod and applied the distal femoral cutting guide  set at 11mm, with 5 degrees of valgus. This was pinned along the  epicondylar axis. At this point, the distal femoral cut was accomplished without difficulty. We then sized for a #5L femoral component and pinned the guide in 3 degrees of external rotation.The chamfer cutting guide was pinned into place. The anterior, posterior, and chamfer cuts  were accomplished without difficulty followed by  the Sigma RP box cutting guide and the box cut. We also removed posterior osteophytes from the posterior femoral condyles. At this  time, the knee was brought into full extension. We checked our  extension and flexion gaps and found them symmetric at 10mm.  The patella thickness measured at 28 mm. We set the cutting guide at 18 and removed the posterior 10 mm  of the patella, sized for a 41 button and drilled the lollipop. The knee  was then once again hyperflexed exposing the proximal tibia. We sized for a #5 tibial base plate, applied the smokestack and the conical reamer followed by the the Delta fin keel punch. We then hammered into place the Sigma RP trial femoral component, inserted a 10-mm trial bearing, trial patellar button, and took the knee through range of motion from 0-130 degrees. No thumb pressure was required for patellar  tracking. At this point, all trial components were removed, a double batch of DePuy HV cement with 1500 mg of Zinacef was mixed and applied to all bony metallic mating surfaces except for the posterior condyles of the femur itself. In order, we  hammered into place the tibial tray and removed excess cement, the femoral component and removed excess cement, a 10-mm Sigma RP bearing  was inserted, and the knee brought to full extension with compression.  The patellar button was clamped into place, and excess cement  removed. While the cement cured the wound was irrigated out with normal saline solution pulse lavage, and medium Hemovac drains were placed from an anterolateral  approach. Ligament stability and patellar tracking were checked and found to be excellent. The parapatellar arthrotomy was closed with  running #1 Vicryl suture. The subcutaneous tissue with 0 and 2-0 undyed  Vicryl suture, and the skin with skin staples. A dressing of Xeroform,  4 x 4, dressing sponges, Webril, and Ace wrap applied. The patient   awakened, extubated, and taken to recovery room without difficulty.   Kyri Shader J 04/23/2013, 1:35 PM

## 2013-04-23 NOTE — Preoperative (Signed)
Beta Blockers   Reason not to administer Beta Blockers:Not Applicable 

## 2013-04-23 NOTE — Progress Notes (Signed)
Orthopedic Tech Progress Note Patient Details:  Edwin Shaffer 21-Jan-1946 [id number] Applied CPM to LLE.  Applied OHF with trapeze to pt.'s bed. CPM Left Knee CPM Left Knee: On Left Knee Flexion (Degrees): 60 Left Knee Extension (Degrees): 0   Lesle Chris 04/23/2013, 4:20 PM

## 2013-04-24 ENCOUNTER — Encounter (HOSPITAL_COMMUNITY): Payer: Self-pay | Admitting: Orthopedic Surgery

## 2013-04-24 LAB — GLUCOSE, CAPILLARY
Glucose-Capillary: 213 mg/dL — ABNORMAL HIGH (ref 70–99)
Glucose-Capillary: 221 mg/dL — ABNORMAL HIGH (ref 70–99)

## 2013-04-24 LAB — CBC
MCH: 31.1 pg (ref 26.0–34.0)
MCHC: 35.5 g/dL (ref 30.0–36.0)
MCV: 87.6 fL (ref 78.0–100.0)
Platelets: 249 10*3/uL (ref 150–400)
RDW: 13.2 % (ref 11.5–15.5)
WBC: 10.3 10*3/uL (ref 4.0–10.5)

## 2013-04-24 LAB — HEMOGLOBIN A1C
Hgb A1c MFr Bld: 7.2 % — ABNORMAL HIGH (ref <5.7)
Mean Plasma Glucose: 160 mg/dL — ABNORMAL HIGH (ref <117)

## 2013-04-24 NOTE — Clinical Social Work Note (Signed)
Clinical Social Work Department BRIEF PSYCHOSOCIAL ASSESSMENT 04/24/2013  Patient:  JEREMY, DITULLIO     Account Number:  [id number]     Admit date:  04/23/2013  Clinical Social Worker:  Hulan Fray  Date/Time:  04/24/2013 11:03 AM  Referred by:  Physician  Date Referred:  04/24/2013 Referred for  SNF Placement   Other Referral:   Interview type:  Patient Other interview type:   Spouse at bedside    PSYCHOSOCIAL DATA Living Status:  WIFE Admitted from facility:   Level of care:   Primary support name:  [facility] Primary support relationship to patient:  SPOUSE Degree of support available:   supportive    CURRENT CONCERNS Current Concerns  Post-Acute Placement   Other Concerns:    SOCIAL WORK ASSESSMENT / PLAN Clinical Social Worker received referral for SNF placement and in particular [facility]. CSW introduced self and explained reason for visit. Wife was at bedside. CSW provided SNF packet to wife. Per patient and wife, the plan is for [facility]. CSW completed FL2 for MD's signature and will send clinicals to [facility] for review.   Assessment/plan status:  Psychosocial Support/Ongoing Assessment of Needs Other assessment/ plan:   Information/referral to community resources:   SNF packet    PATIENT'S/FAMILY'S RESPONSE TO PLAN OF CARE: Per patient and spouse, plan is for [facility] SNF at discharge. Both were appreciative of CSW's visit and assistance.

## 2013-04-24 NOTE — Clinical Social Work Placement (Addendum)
Clinical Social Work Department CLINICAL SOCIAL WORK PLACEMENT NOTE 04/24/2013  Patient:  Edwin Shaffer, Edwin Shaffer  Account Number:  [id number] Admit date:  04/23/2013  Clinical Social Worker:  Hulan Fray  Date/time:  04/24/2013 11:16 AM  Clinical Social Work is seeking post-discharge placement for this patient at the following level of care:   SKILLED NURSING   (*CSW will update this form in Epic as items are completed)   04/24/2013  Patient/family provided with [facility] Department of Clinical Social Work's list of facilities offering this level of care within the geographic area requested by the patient (or if unable, by the patient's family).  04/24/2013  Patient/family informed of their freedom to choose among providers that offer the needed level of care, that participate in Medicare, Medicaid or managed care program needed by the patient, have an available bed and are willing to accept the patient.  04/24/2013  Patient/family informed of [facility]' ownership interest in [facility], as well as of the fact that they are under no obligation to receive care at this facility.  PASARR submitted to EDS on 04/24/2013 PASARR number received from EDS on 04/24/2013  FL2 transmitted to all facilities in geographic area requested by pt/family on  04/24/2013 FL2 transmitted to all facilities within larger geographic area on   Patient informed that his/her managed care company has contracts with or will negotiate with  certain facilities, including the following:     Patient/family informed of bed offers received:   Patient chooses bed at [facility] Physician recommends and patient chooses bed at    Patient to be transferred to  on  04/25/2013 Patient to be transferred to facility by EMS/PTAR  The following physician request were entered in Epic:   Additional Comments:  (coverage for Amy Stuckey)

## 2013-04-24 NOTE — Progress Notes (Signed)
Patient ID: Edwin Shaffer, male   DOB: May 17, 1946, 67 y.o.   MRN: [id number] PATIENT ID: Edwin Shaffer  MRN: [id number]  DOB/AGE:  Dec 21, 1945 / 67 y.o.  1 Day Post-Op Procedure(s) (LRB): TOTAL KNEE ARTHROPLASTY (Left)    PROGRESS NOTE Subjective: Patient is alert, oriented, no Nausea, no Vomiting, yes passing gas, no Bowel Movement. Taking PO well. Denies SOB, Chest or Calf Pain. Using Incentive Spirometer, PAS in place. Ambulate WBAT, CPM 0-60 Patient reports pain as 3 on 0-10 scale  .    Objective: Vital signs in last 24 hours: Filed Vitals:   04/23/13 1806 04/23/13 2051 04/24/13 0202 04/24/13 0603  BP: 156/95 189/99 160/90 158/96  Pulse: 88 99 88   Temp: 97.8 F (36.6 C) 99.1 F (37.3 C)    TempSrc:  Oral    Resp: 14 18    SpO2: 96% 97%        Intake/Output from previous day: I/O last 3 completed shifts: In: 2620 [P.O.:120; I.V.:2500] Out: 2635 [Urine:2400; Drains:235]   Intake/Output this shift:     LABORATORY DATA:  Recent Labs  04/23/13 1819 04/23/13 2117 04/24/13 0605 04/24/13 0650  WBC  --   --  10.3  --   HGB  --   --  12.5*  --   HCT  --   --  35.2*  --   PLT  --   --  249  --   GLUCAP 136* 189*  --  189*    Examination: Neurologically intact ABD soft Neurovascular intact Sensation intact distally Intact pulses distally Dorsiflexion/Plantar flexion intact Incision: no drainage No cellulitis present Compartment soft} Blood and plasma separated in drain indicating minimal recent drainage, drain pulled without difficulty.  Assessment:   1 Day Post-Op Procedure(s) (LRB): TOTAL KNEE ARTHROPLASTY (Left) ADDITIONAL DIAGNOSIS:  Diabetes and Hypertension  Plan: PT/OT WBAT, CPM 5/hrs day until ROM 0-90 degrees, then D/C CPM DVT Prophylaxis:  SCDx72hrs, ASA 325 mg BID x 2 weeks DISCHARGE PLAN: Skilled Nursing Facility/Rehab, plan is for [name]  place either tomorrow or the next day DISCHARGE NEEDS: HHPT, HHRN, CPM, Walker and 3-in-1 comode  seat     Damein Gaunce J 04/24/2013, 7:34 AM

## 2013-04-24 NOTE — Progress Notes (Addendum)
Patient voided 100cc this morning at 1000. Patient voided only 100cc at 1700 and another 100cc again at 1715. Patient was bladder scanned and 400cc was still left in bladder. Patient was unable to empty bladder and was in&out cathed and 600cc was recorded at 1800.

## 2013-04-24 NOTE — Progress Notes (Signed)
Orthopedic Tech Progress Note Patient Details:  Edwin Shaffer 1945-11-02 [id number]  Patient ID: [name], male   DOB: 1945-11-11, 67 y.o.   MRN: [id number] Placed pt's lle in cpm @ 0- 30 degrees as tolerated by pt; rn notified  Lillyn Wieczorek 04/24/2013, 2:52 PM

## 2013-04-24 NOTE — Evaluation (Signed)
Physical Therapy Evaluation Patient Details Name: RYSHAWN SANZONE MRN: [id number] DOB: Mar 23, 1946 Today's Date: 04/24/2013 Time: 4098-1191 PT Time Calculation (min): 33 min  PT Assessment / Plan / Recommendation History of Present Illness  Pt is a 67 y/o male admitted s/p L TKA on 04/23/13  Clinical Impression  Pt currently living with wife; she was present in room and assisted with some history details. At the time of PT evaluation, pt was able to perform functional mobility with min guard and RW. Tolerance for functional activity notably  decreased. Pt required elevated surface for sit<>stand due to 6'2 height, but still negotiated well. This patient is appropriate for skilled PT interventions to address functional limitations, improve safety and independence with functional mobility, and return to PLOF.    PT Assessment  Patient needs continued PT services    Follow Up Recommendations  SNF    Does the patient have the potential to tolerate intense rehabilitation      Barriers to Discharge        Equipment Recommendations       Recommendations for Other Services     Frequency 7X/week    Precautions / Restrictions Precautions Precautions: Knee;Fall Required Braces or Orthoses: Knee Immobilizer - Left Knee Immobilizer - Left: Other (comment) (On in bed when not in CPM) Restrictions Weight Bearing Restrictions: Yes LLE Weight Bearing: Weight bearing as tolerated   Pertinent Vitals/Pain Pt reports severe nausea after ambulation, and states he was not able to eat much of his breakfast due to nausea as well.       Mobility  Bed Mobility Bed Mobility: Supine to Sit;Sitting - Scoot to Edge of Bed Supine to Sit: 4: Min guard;With rails Sitting - Scoot to Edge of Bed: 5: Supervision Details for Bed Mobility Assistance: Pt was able to perform transfer to EOB with min guard for supine>sit. Pt demonstrated ability to use bed rails for appropriate support. Transfers Transfers:  Sit to Stand;Stand to Sit Sit to Stand: 4: Min guard;From bed (pt is tall - required elevated surface) Stand to Sit: 4: Min guard;With armrests;To chair/3-in-1;To elevated surface (as pt is tall) Details for Transfer Assistance: Pt requires min guard and slight min assist for stand to sit for controlled descent. Pt is 6'2 and requires pillows to sit on in chair. Ambulation/Gait Ambulation/Gait Assistance: 4: Min guard Ambulation Distance (Feet): 40 Feet Assistive device: Rolling walker Ambulation/Gait Assistance Details: Pt cued for sequencing and safety awareness with the RW. Pt also cued to keep heel contact on floor during swing through of opposite leg, as pt was WB through ball of foot. Gait Pattern: Step-to pattern;Decreased stride length Gait velocity: decreased    Exercises Total Joint Exercises Quad Sets: 10 reps;Left Heel Slides: 10 reps;Left;Seated Long Arc Quad: 10 reps;Left;Seated Goniometric ROM: 5-78 degrees   PT Diagnosis: Difficulty walking;Acute pain  PT Problem List: Decreased strength;Decreased range of motion;Decreased activity tolerance;Decreased balance;Decreased mobility;Decreased knowledge of use of DME;Decreased safety awareness;Pain PT Treatment Interventions: DME instruction;Gait training;Functional mobility training;Therapeutic activities;Therapeutic exercise;Neuromuscular re-education;Patient/family education     PT Goals(Current goals can be found in the care plan section) Acute Rehab PT Goals Patient Stated Goal: to return to PLOF PT Goal Formulation: With patient/family Time For Goal Achievement: 05/01/13 Potential to Achieve Goals: Good  Visit Information  Last PT Received On: 04/24/13 Assistance Needed: +1 History of Present Illness: Pt is a 67 y/o male admitted s/p L TKA on 04/23/13       Prior Functioning  Home Living Family/patient expects  to be discharged to:: Skilled nursing facility Living Arrangements: Spouse/significant other Prior  Function Level of Independence: Independent with assistive device(s) Communication Communication: No difficulties Dominant Hand: Left    Cognition  Cognition Arousal/Alertness: Awake/alert Behavior During Therapy: WFL for tasks assessed/performed Overall Cognitive Status: Within Functional Limits for tasks assessed    Extremity/Trunk Assessment Upper Extremity Assessment Upper Extremity Assessment: Defer to OT evaluation Lower Extremity Assessment Lower Extremity Assessment: LLE deficits/detail LLE Deficits / Details: Decreased strength and AROM consistent with L TKA Cervical / Trunk Assessment Cervical / Trunk Assessment: Normal   Balance Balance Balance Assessed: Yes Static Sitting Balance Static Sitting - Balance Support: No upper extremity supported;Feet supported Static Sitting - Level of Assistance: 5: Stand by assistance Static Sitting - Comment/# of Minutes: 3 Static Standing Balance Static Standing - Balance Support: Bilateral upper extremity supported Static Standing - Level of Assistance: 5: Stand by assistance Static Standing - Comment/# of Minutes: 1  End of Session PT - End of Session Equipment Utilized During Treatment: Gait belt Activity Tolerance: Patient tolerated treatment well Patient left: in chair;with call bell/phone within reach;with family/visitor present CPM Left Knee CPM Left Knee: Off Left Knee Flexion (Degrees): 60 Left Knee Extension (Degrees): 0  GP     Ruthann Cancer 04/24/2013, 11:07 AM  Ruthann Cancer, PT Acute Rehabilitation Services

## 2013-04-24 NOTE — Progress Notes (Signed)
UR COMPLETED  

## 2013-04-24 NOTE — Anesthesia Postprocedure Evaluation (Signed)
  Anesthesia Post-op Note  Patient: Edwin Shaffer  Procedure(s) Performed: Procedure(s): TOTAL KNEE ARTHROPLASTY (Left)  Patient Location: PACU  Anesthesia Type:General  Level of Consciousness: awake  Airway and Oxygen Therapy: Patient Spontanous Breathing  Post-op Pain: mild  Post-op Assessment: Post-op Vital signs reviewed  Post-op Vital Signs: stable  Complications: No apparent anesthesia complications

## 2013-04-24 NOTE — Progress Notes (Signed)
Physical Therapy Treatment Patient Details Name: Edwin Shaffer MRN: [id number] DOB: August 20, 1946 Today's Date: 04/24/2013 Time: 4098-1191 PT Time Calculation (min): 36 min  PT Assessment / Plan / Recommendation  History of Present Illness Pt is a 67 y/o male admitted s/p L TKA on 04/23/13   PT Comments   Pt in CPM when PT enters. He is progressing towards physical therapy goals. Pt was limited in afternoon session by nausea. A good amount of time was spend with pt sitting EOB trying to get nausea to subside after ambulation. He was able to increase distance ambulated, however continues to require cueing to keep feet inside the walker. A bariatric walker may be more comfortable due to pt's size. Exercise deferred this session due to pt's tolerance for activity with his nausea, however therapist went over HEP handout and discussed performing some exercise on his own later this evening if he was feeling better.   Follow Up Recommendations  SNF     Does the patient have the potential to tolerate intense rehabilitation     Barriers to Discharge        Equipment Recommendations       Recommendations for Other Services    Frequency 7X/week   Progress towards PT Goals Progress towards PT goals: Progressing toward goals  Plan Current plan remains appropriate    Precautions / Restrictions Precautions Precautions: Knee;Fall Required Braces or Orthoses: Knee Immobilizer - Left Knee Immobilizer - Left: Other (comment) (On in bed when not in CPM) Restrictions Weight Bearing Restrictions: Yes LLE Weight Bearing: Weight bearing as tolerated   Pertinent Vitals/Pain Pt reports pain only with transfers, and does not rate this pain on 0-10 scale.    Mobility  Bed Mobility Bed Mobility: Supine to Sit;Sitting - Scoot to Edge of Bed Supine to Sit: 4: Min assist;With rails;HOB flat Sitting - Scoot to [place] of Bed: 4: Min guard Details for Bed Mobility Assistance: Pt required increased assist to  perform transfer to EOB. Pt was cued for sequencing and tactile cues were given for hand placement on seated surface and rails for support Transfers Transfers: Sit to Stand;Stand to Sit Sit to Stand: 4: Min guard;From bed Stand to Sit: 4: Min guard;With armrests;To chair/3-in-1;To elevated surface Details for Transfer Assistance: Pt was able to perform sit>stand from bed in lowest position, and was able to perform transfers with only min guard. Ambulation/Gait Ambulation/Gait Assistance: 4: Min guard Ambulation Distance (Feet): 75 Feet Assistive device: Rolling walker Ambulation/Gait Assistance Details: Pt cued for sequencing and safety awareness with walker. Pt often stepped outside of walker and required cueing for proper walker positioning, especially during turns. Gait Pattern: Step-to pattern;Decreased stride length Gait velocity: decreased    Exercises   Exercises deferred this session because of decreased activity tolerance after ambulation due to nausea.   PT Diagnosis:    PT Problem List:   PT Treatment Interventions:     PT Goals (current goals can now be found in the care plan section) Acute Rehab PT Goals Patient Stated Goal: to return to PLOF PT Goal Formulation: With patient/family Time For Goal Achievement: 05/01/13 Potential to Achieve Goals: Good  Visit Information  Last PT Received On: 04/24/13 Assistance Needed: +1 History of Present Illness: Pt is a 67 y/o male admitted s/p L TKA on 04/23/13    Subjective Data  Subjective: "I'm hurting a little bit." Patient Stated Goal: to return to PLOF   Cognition  Cognition Arousal/Alertness: Awake/alert Behavior During Therapy: [facility] for tasks  assessed/performed Overall Cognitive Status: Within Functional Limits for tasks assessed    Balance  Static Sitting Balance Static Sitting - Balance Support: No upper extremity supported;Feet supported Static Sitting - Level of Assistance: 6: Modified independent  (Device/Increase time) Static Sitting - Comment/# of Minutes: 5  End of Session PT - End of Session Equipment Utilized During Treatment: Gait belt Activity Tolerance: Patient tolerated treatment well Patient left: in chair;with call bell/phone within reach;with family/visitor present   GP     Ruthann Cancer 04/24/2013, 4:26 PM  Ruthann Cancer, PT Acute Rehabilitation Services

## 2013-04-25 LAB — CBC
MCHC: 34.5 g/dL (ref 30.0–36.0)
RDW: 13.4 % (ref 11.5–15.5)

## 2013-04-25 LAB — GLUCOSE, CAPILLARY: Glucose-Capillary: 181 mg/dL — ABNORMAL HIGH (ref 70–99)

## 2013-04-25 MED ORDER — OXYCODONE-ACETAMINOPHEN 5-325 MG PO TABS: 1.0000 | ORAL_TABLET | ORAL | Status: DC | PRN

## 2013-04-25 MED ORDER — METHOCARBAMOL 500 MG PO TABS: 500.0000 mg | ORAL_TABLET | Freq: Two times a day (BID) | ORAL | Status: DC

## 2013-04-25 MED ORDER — ASPIRIN EC 325 MG PO TBEC: 325.0000 mg | DELAYED_RELEASE_TABLET | Freq: Two times a day (BID) | ORAL | Status: DC

## 2013-04-25 NOTE — Progress Notes (Signed)
Inpatient Diabetes Program Recommendations  AACE/ADA: New Consensus Statement on Inpatient Glycemic Control (2013)  Target Ranges:  Prepandial:   less than 140 mg/dL      Peak postprandial:   less than 180 mg/dL (1-2 hours)      Critically ill patients:  140 - 180 mg/dL   Results for WILMONT, OLUND (MRN [id number]) as of 04/25/2013 13:18  Ref. Range 04/24/2013 06:50 04/24/2013 11:27 04/24/2013 16:44 04/24/2013 21:41 04/25/2013 06:33 04/25/2013 11:12  Glucose-Capillary Latest Range: 70-99 mg/dL 409 (H) 811 (H) 914 (H) 221 (H) 172 (H) 181 (H)    Inpatient Diabetes Program Recommendations  IV fluids:  Noted patient is ordered D5 0.45% NS with 20 mEq KCL @ 125 ml/hr.  Please re-evaluate need for dextrose in IVF. Correction (SSI): Please consider adding Novolog bedtime correction scale.   Thanks, Orlando Penner, RN, MSN, CCRN Diabetes Coordinator Inpatient Diabetes Program [phone]

## 2013-04-25 NOTE — Evaluation (Signed)
Occupational Therapy Evaluation Patient Details Name: Edwin Shaffer MRN: [id number] DOB: 11-14-1945 Today's Date: 04/25/2013 Time: 4098-1191 OT Time Calculation (min): 28 min  OT Assessment / Plan / Recommendation History of present illness Pt is a 67 y/o male admitted s/p L TKA on 04/23/13   Clinical Impression   Pt demos decline in function with ADLs and ADL mobility safety following L TKA. Pt would benefit from acute OT services to address impairments to increase level of function and safety    OT Assessment  Patient needs continued OT Services    Follow Up Recommendations  SNF    Barriers to Discharge Decreased caregiver support Pt's wife works and would nt be able to provde adequate assist and sup, planning to d/c to SNF for short term rehab  Equipment Recommendations  None recommended by OT;Other (comment) (TBD at SNF level of care)    Recommendations for Other Services    Frequency  Min 2X/week    Precautions / Restrictions Precautions Precautions: Knee;Fall Required Braces or Orthoses: Knee Immobilizer - Left Restrictions Weight Bearing Restrictions: Yes LLE Weight Bearing: Weight bearing as tolerated   Pertinent Vitals/Pain 7/10    ADL  Grooming: Performed;Wash/dry hands;Wash/dry face;Min guard Where Assessed - Grooming: Supported standing Upper Body Bathing: Simulated;Set up;Supervision/safety Lower Body Bathing: Simulated;Moderate assistance Upper Body Dressing: Performed;Set up;Supervision/safety Lower Body Dressing: Performed;Maximal assistance;Moderate assistance Toilet Transfer: Performed;Minimal assistance Toilet Transfer Method: Sit to stand Toilet Transfer Equipment: Raised toilet seat with arms (or 3-in-1 over toilet);Grab bars Toileting - Clothing Manipulation and Hygiene: Performed;Minimal assistance Where Assessed - Glass blower/designer Manipulation and Hygiene: Standing Tub/Shower Transfer Method: Not assessed Equipment Used: Rolling walker;Sock  aid;Long-handled shoe horn;Long-handled sponge;Reacher Transfers/Ambulation Related to ADLs: cues for safety, correct hand placement ADL Comments: Pt and wife provided with education and demo of ADL A/E    OT Diagnosis: Generalized weakness;Acute pain  OT Problem List: Decreased activity tolerance;Decreased safety awareness;Impaired balance (sitting and/or standing);Decreased knowledge of use of DME or AE;Pain OT Treatment Interventions: Self-care/ADL training;Therapeutic activities;Therapeutic exercise;Neuromuscular education;Patient/family education;DME and/or AE instruction;Balance training   OT Goals(Current goals can be found in the care plan section) Acute Rehab OT Goals Patient Stated Goal: to return to PLOF OT Goal Formulation: With patient/family Time For Goal Achievement: 05/02/13 Potential to Achieve Goals: Good ADL Goals Pt Will Perform Grooming: with set-up;with supervision;standing Pt Will Perform Lower Body Bathing: with min assist Pt Will Perform Lower Body Dressing: with min assist Pt Will Transfer to Toilet: with min guard assist;with supervision Pt Will Perform Toileting - Clothing Manipulation and hygiene: with min guard assist;with supervision;sit to/from stand  Visit Information  Last OT Received On: 04/25/13 Assistance Needed: +1 History of Present Illness: Pt is a 67 y/o male admitted s/p L TKA on 04/23/13       Prior Functioning     Home Living Family/patient expects to be discharged to:: Skilled nursing facility Living Arrangements: Spouse/significant other Prior Function Level of Independence: Independent with assistive device(s) Communication Communication: No difficulties Dominant Hand: Left         Vision/Perception Vision - History Baseline Vision: Wears glasses only for reading Patient Visual Report: No change from baseline Perception Perception: Within Functional Limits   Cognition  Cognition Arousal/Alertness: Awake/alert Behavior  During Therapy: WFL for tasks assessed/performed Overall Cognitive Status: Within Functional Limits for tasks assessed    Extremity/Trunk Assessment Upper Extremity Assessment Upper Extremity Assessment: Overall WFL for tasks assessed     Mobility Bed Mobility Bed Mobility: Not assessed Details for  Bed Mobility Assistance: Pt up in recliner upon entering room Transfers Sit to Stand: 4: Min assist;From chair/3-in-1;With armrests;With upper extremity assist Stand to Sit: 4: Min assist;With armrests;With upper extremity assist;To chair/3-in-1 Details for Transfer Assistance: A to stand from lower surface and cues for correct technique        Balance Balance Balance Assessed: Yes Dynamic Standing Balance Dynamic Standing - Balance Support: During functional activity Dynamic Standing - Level of Assistance: 4: Min assist   End of Session OT - End of Session Equipment Utilized During Treatment: Rolling walker Activity Tolerance: Patient tolerated treatment well Patient left: in chair;with call bell/phone within reach;with nursing/sitter in room;with family/visitor present CPM Left Knee CPM Left Knee: Off  GO     Margaretmary Eddy [facility] 04/25/2013, 12:55 PM

## 2013-04-25 NOTE — Discharge Summary (Signed)
Patient ID: Edwin Shaffer MRN: [id number] DOB/AGE: 03/03/1946 67 y.o.  Admit date: 04/23/2013 Discharge date: 04/25/2013  Admission Diagnoses:  Principal Problem:   Osteoarthritis of left knee   Discharge Diagnoses:  Same  Past Medical History  Diagnosis Date  . Hypertension   . Diabetes mellitus without complication   . Cancer     vocal cord  . Arthritis     Surgeries: Procedure(s): TOTAL KNEE ARTHROPLASTY on 04/23/2013   Consultants:    Discharged Condition: Improved  Hospital Course: Edwin Shaffer is an 67 y.o. male who was admitted 04/23/2013 for operative treatment ofOsteoarthritis of left knee. Patient has severe unremitting pain that affects sleep, daily activities, and work/hobbies. After pre-op clearance the patient was taken to the operating room on 04/23/2013 and underwent  Procedure(s): TOTAL KNEE ARTHROPLASTY.    Patient was given perioperative antibiotics: Anti-infectives   Start     Dose/Rate Route Frequency Ordered Stop   04/23/13 1227  cefUROXime (ZINACEF) injection  Status:  Discontinued       As needed 04/23/13 1227 04/23/13 1414   04/23/13 0600  ceFAZolin (ANCEF) 3 g in dextrose 5 % 50 mL IVPB     3 g 160 mL/hr over 30 Minutes Intravenous On call to O.R. 04/22/13 1432 04/23/13 1151       Patient was given sequential compression devices, early ambulation, and chemoprophylaxis to prevent DVT.  Patient benefited maximally from hospital stay and there were no complications.    Recent vital signs: Patient Vitals for the past 24 hrs:  BP Temp Temp src Pulse Resp SpO2 Height Weight  04/25/13 0642 149/80 mmHg 100 F (37.8 C) Oral 101 18 98 % - -  04/24/13 2100 - 98.5 F (36.9 C) Oral - - - - -  04/24/13 1400 113/56 mmHg 100.7 F (38.2 C) - 100 18 97 % - -  04/24/13 1300 - - - - - - 6' 2.5" (1.892 m) 126.2 kg (278 lb 3.5 oz)     Recent laboratory studies:  Recent Labs  04/24/13 0605 04/25/13 0540  WBC 10.3 13.9*  HGB 12.5* 10.5*  HCT  35.2* 30.4*  PLT 249 209     Discharge Medications:     Medication List         aspirin EC 325 MG tablet  Take 1 tablet (325 mg total) by mouth 2 (two) times daily.     diltiazem 300 MG 24 hr capsule  Commonly known as:  TIAZAC  Take 300 mg by mouth daily.     FARXIGA 5 MG Tabs  Generic drug:  Dapagliflozin Propanediol  Take 5 mg by mouth daily.     HYDROcodone-acetaminophen 5-325 MG per tablet  Commonly known as:  NORCO/VICODIN  Take 1 tablet by mouth 2 (two) times daily as needed for pain.     insulin glargine 100 UNIT/ML injection  Commonly known as:  LANTUS  Inject 20 Units into the skin at bedtime.     insulin lispro protamine-lispro (75-25) 100 UNIT/ML Susp injection  Commonly known as:  HUMALOG 75/25  Inject 45 Units into the skin daily with breakfast.     lisinopril 20 MG tablet  Commonly known as:  PRINIVIL,ZESTRIL  Take 20 mg by mouth daily.     methocarbamol 500 MG tablet  Commonly known as:  ROBAXIN  Take 1 tablet (500 mg total) by mouth 2 (two) times daily with a meal.     oxyCODONE-acetaminophen 5-325 MG per tablet  Commonly known as:  ROXICET  Take 1 tablet by mouth every 4 (four) hours as needed for pain.     tamsulosin 0.4 MG Caps capsule  Commonly known as:  FLOMAX  Take by mouth daily.        Diagnostic Studies: Dg Chest 2 View  04/17/2013   *RADIOLOGY REPORT*  Clinical Data: Preoperative evaluation for left knee replacement, history hypertension, diabetes, former smoker  CHEST - 2 VIEW  Comparison: None  Findings: Normal heart size, mediastinal contours, and pulmonary vascularity. Minimal peribronchial thickening. No acute infiltrate, pleural effusion or pneumothorax. Broad-based dextroconvex thoracic scoliosis with scattered endplate spur formation thoracic spine. No acute osseous findings.  IMPRESSION: Mild bronchitic changes.   Original Report Authenticated By: Ulyses Southward, M.D.    Disposition: Final discharge disposition not confirmed       Discharge Orders   Future Orders Complete By Expires   Call MD / Call 911  As directed    Comments:     If you experience chest pain or shortness of breath, CALL 911 and be transported to the hospital emergency room.  If you develope a fever above 101 F, pus (white drainage) or increased drainage or redness at the wound, or calf pain, call your surgeon's office.   Change dressing  As directed    Comments:     Change dressing on 5, then change the dressing daily with sterile 4 x 4 inch gauze dressing and apply TED hose.  You may clean the incision with alcohol prior to redressing.   Constipation Prevention  As directed    Comments:     Drink plenty of fluids.  Prune juice may be helpful.  You may use a stool softener, such as Colace (over the counter) 100 mg twice a day.  Use MiraLax (over the counter) for constipation as needed.   CPM  As directed    Comments:     Continuous passive motion machine (CPM):      Use the CPM from 0 to 60  for 5 hours per day.      You may increase by 10 degrees per day.  You may break it up into 2 or 3 sessions per day.      Use CPM for 2 weeks or until you are told to stop.   Diet - low sodium heart healthy  As directed    Discharge instructions  As directed    Comments:     Follow up with Dr. Turner Daniels in office in 2 weeks.   Driving restrictions  As directed    Comments:     No driving for 2 weeks   Increase activity slowly as tolerated  As directed    Patient may shower  As directed    Comments:     You may shower without a dressing once there is no drainage.  Do not wash over the wound.  If drainage remains, cover wound with plastic wrap and then shower.      Follow-up Information   Follow up with [name], MD In 2 weeks.   Specialty:  Orthopedic Surgery   Contact information:   [address] [place] [address] [phone]        Signed: Henry Russel 04/25/2013, 7:44 AM

## 2013-04-25 NOTE — Progress Notes (Signed)
Physical Therapy Treatment Patient Details Name: Edwin Shaffer MRN: [id number] DOB: 12-17-1945 Today's Date: 04/25/2013 Time: 0752-0816 PT Time Calculation (min): 24 min  PT Assessment / Plan / Recommendation  History of Present Illness Pt is a 67 y/o male admitted s/p L TKA on 04/23/13   PT Comments   Patient progressing with ambulation. Due to height patient struggling some to stand from lower surface. Continue with current POC  Follow Up Recommendations  SNF     Does the patient have the potential to tolerate intense rehabilitation     Barriers to Discharge        Equipment Recommendations       Recommendations for Other Services    Frequency 7X/week   Progress towards PT Goals Progress towards PT goals: Progressing toward goals  Plan Current plan remains appropriate    Precautions / Restrictions Precautions Precautions: Knee;Fall Restrictions LLE Weight Bearing: Weight bearing as tolerated   Pertinent Vitals/Pain 8/10 L knee pain. patient repositioned for comfort     Mobility  Bed Mobility Bed Mobility: Not assessed Transfers Sit to Stand: 4: Min assist;From chair/3-in-1;With armrests;With upper extremity assist Stand to Sit: 4: Min assist;With armrests;With upper extremity assist;To chair/3-in-1 Details for Transfer Assistance: A to stand from lower surface and cues for correct technique Ambulation/Gait Ambulation/Gait Assistance: 4: Min guard Ambulation Distance (Feet): 80 Feet Assistive device: Rolling walker Ambulation/Gait Assistance Details: Cues for upright posture and safety/positioning of RW Gait Pattern: Step-to pattern;Wide base of support Gait velocity: decreased    Exercises Total Joint Exercises Quad Sets: 10 reps;Left Heel Slides: AAROM;Right;10 reps Hip ABduction/ADduction: AAROM;Right;10 reps Straight Leg Raises: AAROM;Right;10 reps Long Arc Quad: AAROM;Right;10 reps   PT Diagnosis:    PT Problem List:   PT Treatment Interventions:      PT Goals (current goals can now be found in the care plan section)    Visit Information  Last PT Received On: 04/25/13 Assistance Needed: +1 History of Present Illness: Pt is a 67 y/o male admitted s/p L TKA on 04/23/13    Subjective Data      Cognition  Cognition Arousal/Alertness: Awake/alert Behavior During Therapy: WFL for tasks assessed/performed Overall Cognitive Status: Within Functional Limits for tasks assessed    Balance     End of Session PT - End of Session Equipment Utilized During Treatment: Gait belt Activity Tolerance: Patient tolerated treatment well Patient left: in chair;with call bell/phone within reach;with family/visitor present   GP     Fredrich Birks 04/25/2013, 9:36 AM 04/25/2013 Fredrich Birks PTA [phone] pager [phone] office

## 2013-04-25 NOTE — Progress Notes (Signed)
Pt voided 75cc at 0130 after in and out cathed done at 1800. Bladder scanned recorded 430 left in bladder. Pt was in and out cathed and 475cc was recorded at 0210.

## 2013-04-25 NOTE — Plan of Care (Signed)
Problem: Phase II Progression Outcomes Goal: Discharge plan established Pt/wife  planning for him to d/c to SNF for short term rehab after acute care stay

## 2013-04-25 NOTE — Progress Notes (Signed)
PATIENT ID: LAVARR PRESIDENT  MRN: [id number]  DOB/AGE:  [birth year] / 67 y.o.  2 Days Post-Op Procedure(s) (LRB): TOTAL KNEE ARTHROPLASTY (Left)    PROGRESS NOTE Subjective: Patient is alert, oriented, no Nausea, no Vomiting, yes passing gas, no Bowel Movement. Taking PO well. Denies SOB, Chest or Calf Pain. Using Incentive Spirometer, PAS in place. Ambulate wbat, CPM 0-30 Patient reports pain as mild  .    Objective: Vital signs in last 24 hours: Filed Vitals:   04/24/13 1300 04/24/13 1400 04/24/13 2100 04/25/13 0642  BP:  113/56  149/80  Pulse:  100  101  Temp:  100.7 F (38.2 C) 98.5 F (36.9 C) 100 F (37.8 C)  TempSrc:   Oral Oral  Resp:  18  18  Height: 6' 2.5" (1.892 m)     Weight: 126.2 kg (278 lb 3.5 oz)     SpO2:  97%  98%      Intake/Output from previous day: I/O last 3 completed shifts: In: 1320 [P.O.:1320] Out: 2940 [Urine:2880; Drains:60]   Intake/Output this shift:    Pt voided without difficulty during visit.  LABORATORY DATA:  Recent Labs  04/24/13 0605  04/24/13 1644 04/24/13 2141 04/25/13 0540 04/25/13 0633  WBC 10.3  --   --   --  13.9*  --   HGB 12.5*  --   --   --  10.5*  --   HCT 35.2*  --   --   --  30.4*  --   PLT 249  --   --   --  209  --   GLUCAP  --   < > 213* 221*  --  172*  < > = values in this interval not displayed.  Examination: Neurologically intact Neurovascular intact Sensation intact distally Intact pulses distally Dorsiflexion/Plantar flexion intact Incision: no drainage Compartment soft}  Assessment:   2 Days Post-Op Procedure(s) (LRB): TOTAL KNEE ARTHROPLASTY (Left) ADDITIONAL DIAGNOSIS:  Diabetes and Hypertension  Plan: PT/OT WBAT, CPM 5/hrs day until ROM 0-90 degrees, then D/C CPM DVT Prophylaxis:  SCDx72hrs, ASA 325 mg BID x 2 weeks DISCHARGE PLAN: Skilled Nursing Facility/Rehab to [facility] when pt passes PT. DISCHARGE NEEDS: HHPT, HHRN, CPM, Walker and 3-in-1 comode seat     Cataleia Gade  R 04/25/2013, 7:36 AM

## 2013-04-26 ENCOUNTER — Non-Acute Institutional Stay (SKILLED_NURSING_FACILITY): Payer: Medicare Other | Admitting: Adult Health

## 2013-04-26 ENCOUNTER — Other Ambulatory Visit: Payer: Self-pay | Admitting: *Deleted

## 2013-04-26 DIAGNOSIS — E669 Obesity, unspecified: Secondary | ICD-10-CM

## 2013-04-26 DIAGNOSIS — E1165 Type 2 diabetes mellitus with hyperglycemia: Secondary | ICD-10-CM

## 2013-04-26 DIAGNOSIS — IMO0002 Reserved for concepts with insufficient information to code with codable children: Secondary | ICD-10-CM

## 2013-04-26 DIAGNOSIS — M1712 Unilateral primary osteoarthritis, left knee: Secondary | ICD-10-CM

## 2013-04-26 DIAGNOSIS — I10 Essential (primary) hypertension: Secondary | ICD-10-CM

## 2013-04-26 DIAGNOSIS — M1710 Unilateral primary osteoarthritis, unspecified knee: Secondary | ICD-10-CM

## 2013-04-27 LAB — BODY FLUID CULTURE

## 2013-04-28 LAB — ANAEROBIC CULTURE

## 2013-04-30 HISTORY — PX: EXCISIONAL TOTAL KNEE ARTHROPLASTY WITH ANTIBIOTIC SPACERS: SHX5827

## 2013-05-01 ENCOUNTER — Other Ambulatory Visit: Payer: Self-pay | Admitting: *Deleted

## 2013-05-01 MED ORDER — OXYCODONE-ACETAMINOPHEN 5-325 MG PO TABS: 1.0000 | ORAL_TABLET | ORAL | Status: DC | PRN

## 2013-05-03 ENCOUNTER — Encounter: Payer: Self-pay | Admitting: Internal Medicine

## 2013-05-03 ENCOUNTER — Non-Acute Institutional Stay (SKILLED_NURSING_FACILITY): Payer: BC Managed Care – PPO | Admitting: Internal Medicine

## 2013-05-03 DIAGNOSIS — I10 Essential (primary) hypertension: Secondary | ICD-10-CM | POA: Insufficient documentation

## 2013-05-03 DIAGNOSIS — M1710 Unilateral primary osteoarthritis, unspecified knee: Secondary | ICD-10-CM

## 2013-05-03 DIAGNOSIS — M1712 Unilateral primary osteoarthritis, left knee: Secondary | ICD-10-CM

## 2013-05-03 DIAGNOSIS — E119 Type 2 diabetes mellitus without complications: Secondary | ICD-10-CM | POA: Insufficient documentation

## 2013-05-03 DIAGNOSIS — N400 Enlarged prostate without lower urinary tract symptoms: Secondary | ICD-10-CM | POA: Insufficient documentation
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
## 2013-05-10 ENCOUNTER — Non-Acute Institutional Stay (SKILLED_NURSING_FACILITY): Payer: Medicare Other | Admitting: Nurse Practitioner

## 2013-05-10 ENCOUNTER — Encounter (HOSPITAL_COMMUNITY): Payer: Self-pay | Admitting: Orthopedic Surgery

## 2013-05-10 DIAGNOSIS — E119 Type 2 diabetes mellitus without complications: Secondary | ICD-10-CM | POA: Diagnosis present

## 2013-05-10 DIAGNOSIS — Z8521 Personal history of malignant neoplasm of larynx: Secondary | ICD-10-CM

## 2013-05-10 DIAGNOSIS — I10 Essential (primary) hypertension: Secondary | ICD-10-CM | POA: Diagnosis present

## 2013-05-10 DIAGNOSIS — Z923 Personal history of irradiation: Secondary | ICD-10-CM

## 2013-05-10 DIAGNOSIS — Z87891 Personal history of nicotine dependence: Secondary | ICD-10-CM

## 2013-05-10 DIAGNOSIS — R011 Cardiac murmur, unspecified: Secondary | ICD-10-CM | POA: Diagnosis present

## 2013-05-10 DIAGNOSIS — D62 Acute posthemorrhagic anemia: Secondary | ICD-10-CM | POA: Diagnosis not present

## 2013-05-10 DIAGNOSIS — Y831 Surgical operation with implant of artificial internal device as the cause of abnormal reaction of the patient, or of later complication, without mention of misadventure at the time of the procedure: Secondary | ICD-10-CM | POA: Diagnosis present

## 2013-05-10 DIAGNOSIS — E871 Hypo-osmolality and hyponatremia: Secondary | ICD-10-CM | POA: Diagnosis not present

## 2013-05-10 DIAGNOSIS — T8454XA Infection and inflammatory reaction due to internal left knee prosthesis, initial encounter: Secondary | ICD-10-CM

## 2013-05-10 DIAGNOSIS — K5900 Constipation, unspecified: Secondary | ICD-10-CM | POA: Diagnosis present

## 2013-05-10 DIAGNOSIS — S8000XA Contusion of unspecified knee, initial encounter: Secondary | ICD-10-CM | POA: Diagnosis present

## 2013-05-10 DIAGNOSIS — M129 Arthropathy, unspecified: Secondary | ICD-10-CM | POA: Diagnosis present

## 2013-05-10 DIAGNOSIS — E669 Obesity, unspecified: Secondary | ICD-10-CM

## 2013-05-10 DIAGNOSIS — M1710 Unilateral primary osteoarthritis, unspecified knee: Secondary | ICD-10-CM

## 2013-05-10 DIAGNOSIS — M1712 Unilateral primary osteoarthritis, left knee: Secondary | ICD-10-CM

## 2013-05-10 DIAGNOSIS — Z23 Encounter for immunization: Secondary | ICD-10-CM

## 2013-05-10 DIAGNOSIS — T8454XD Infection and inflammatory reaction due to internal left knee prosthesis, subsequent encounter: Secondary | ICD-10-CM

## 2013-05-10 DIAGNOSIS — T8450XA Infection and inflammatory reaction due to unspecified internal joint prosthesis, initial encounter: Principal | ICD-10-CM | POA: Diagnosis present

## 2013-05-10 LAB — BASIC METABOLIC PANEL
Calcium: 9.4 mg/dL (ref 8.4–10.5)
Chloride: 100 mEq/L (ref 96–112)
Creatinine, Ser: 1.13 mg/dL (ref 0.50–1.35)
GFR calc Af Amer: 77 mL/min — ABNORMAL LOW (ref >90)
GFR calc non Af Amer: 66 mL/min — ABNORMAL LOW (ref >90)

## 2013-05-10 LAB — URINALYSIS, ROUTINE W REFLEX MICROSCOPIC
Glucose, UA: >1000 mg/dL — AB
Leukocytes, UA: NEGATIVE
Nitrite: NEGATIVE
pH: 5.5 (ref 5.0–8.0)

## 2013-05-10 LAB — CBC WITH DIFFERENTIAL/PLATELET
Eosinophils Absolute: 0.1 10*3/uL (ref 0.0–0.7)
Eosinophils Relative: 1 % (ref 0–5)
HCT: 27.3 % — ABNORMAL LOW (ref 39.0–52.0)
Lymphs Abs: 2.6 10*3/uL (ref 0.7–4.0)
MCH: 29.8 pg (ref 26.0–34.0)
MCV: 87.5 fL (ref 78.0–100.0)
Monocytes Absolute: 1.4 10*3/uL — ABNORMAL HIGH (ref 0.1–1.0)
Platelets: 544 10*3/uL — ABNORMAL HIGH (ref 150–400)
RBC: 3.12 MIL/uL — ABNORMAL LOW (ref 4.22–5.81)
RDW: 13.4 % (ref 11.5–15.5)

## 2013-05-10 LAB — URINE MICROSCOPIC-ADD ON

## 2013-05-10 LAB — APTT: aPTT: 34 seconds (ref 24–37)

## 2013-05-10 MED ORDER — DEXTROSE-NACL 5-0.45 % IV SOLN
INTRAVENOUS | Status: DC
  Administered 2013-05-10: 22:00:00 via INTRAVENOUS

## 2013-05-10 MED ORDER — INSULIN ASPART 100 UNIT/ML ~~LOC~~ SOLN: 0.0000 [IU] | SUBCUTANEOUS | Status: DC

## 2013-05-10 MED ORDER — VANCOMYCIN HCL 10 G IV SOLR
2500.0000 mg | Freq: Once | INTRAVENOUS | Status: AC
  Administered 2013-05-10: 2500 mg via INTRAVENOUS
  Filled 2013-05-10: qty 2500

## 2013-05-10 MED ORDER — METHOCARBAMOL 500 MG PO TABS
500.0000 mg | ORAL_TABLET | Freq: Three times a day (TID) | ORAL | Status: DC
  Administered 2013-05-10 – 2013-05-11 (×2): 500 mg via ORAL
  Filled 2013-05-10 (×3): qty 1

## 2013-05-10 MED ORDER — HYDROCODONE-ACETAMINOPHEN 5-325 MG PO TABS: 1.0000 | ORAL_TABLET | ORAL | Status: DC | PRN

## 2013-05-10 MED ORDER — INSULIN ASPART 100 UNIT/ML ~~LOC~~ SOLN: 0.0000 [IU] | Freq: Three times a day (TID) | SUBCUTANEOUS | Status: DC

## 2013-05-10 MED ORDER — VANCOMYCIN HCL 10 G IV SOLR
1250.0000 mg | Freq: Two times a day (BID) | INTRAVENOUS | Status: DC
  Administered 2013-05-11 – 2013-05-15 (×9): 1250 mg via INTRAVENOUS
  Filled 2013-05-10 (×10): qty 1250

## 2013-05-10 MED ORDER — HYDROCODONE-ACETAMINOPHEN 5-325 MG PO TABS
1.0000 | ORAL_TABLET | ORAL | Status: DC | PRN
  Administered 2013-05-10 – 2013-05-11 (×3): 2 via ORAL
  Filled 2013-05-10 (×3): qty 2
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
## 2013-05-10 NOTE — Progress Notes (Addendum)
ANTIBIOTIC CONSULT NOTE - INITIAL  Pharmacy Consult for Vancomycin Indication: Knee wound infection  No Known Allergies  Patient Measurements:  Weight: 126kg  Vital Signs:   Intake/Output from previous day:   Intake/Output from this shift:    Labs: No results found for this basename: WBC, HGB, PLT, LABCREA, CREATININE,  in the last 72 hours The CrCl is unknown because both a height and weight (above a minimum accepted value) are required for this calculation. No results found for this basename: VANCOTROUGH, Leodis Binet, VANCORANDOM, GENTTROUGH, GENTPEAK, GENTRANDOM, TOBRATROUGH, TOBRAPEAK, TOBRARND, AMIKACINPEAK, AMIKACINTROU, AMIKACIN,  in the last 72 hours   Microbiology: Recent Results (from the past 720 hour(s))  SURGICAL PCR SCREEN     Status: None   Collection Time    04/17/13 10:16 AM      Result Value Range Status   MRSA, PCR NEGATIVE  NEGATIVE Final   Staphylococcus aureus NEGATIVE  NEGATIVE Final   Comment:            The Xpert SA Assay (FDA     approved for NASAL specimens     in patients over 39 years of age),     is one component of     a comprehensive surveillance     program.  Test performance has     been validated by [employer] for patients greater     than or equal to 61 year old.     It is not intended     to diagnose infection nor to     guide or monitor treatment.  ANAEROBIC CULTURE     Status: None   Collection Time    04/23/13 12:33 PM      Result Value Range Status   Specimen Description SYNOVIAL FLUID LEFT   Final   Special Requests FLUID ON SWAB   Final   Gram Stain     Final   Value: FEW WBC PRESENT,BOTH PMN AND MONONUCLEAR     NO ORGANISMS SEEN     Performed at [facility]   Culture     Final   Value: NO ANAEROBES ISOLATED     Performed at [facility]   Report Status 04/28/2013 FINAL   Final  BODY FLUID CULTURE     Status: None   Collection Time    04/23/13 12:33 PM      Result Value Range Status   Specimen  Description SYNOVIAL FLUID LEFT   Final   Special Requests FLUID ON SWAB   Final   Gram Stain     Final   Value: FEW WBC PRESENT,BOTH PMN AND MONONUCLEAR     NO ORGANISMS SEEN     Performed at [facility]   Culture     Final   Value: NO GROWTH 4 DAYS     Performed at [facility]   Report Status 04/27/2013 FINAL   Final    Medical History: Past Medical History  Diagnosis Date  . Hypertension   . Diabetes mellitus without complication   . Cancer     vocal cord  . Arthritis     Medications:  Prescriptions prior to admission  Medication Sig Dispense Refill  . aspirin EC 325 MG tablet Take 1 tablet (325 mg total) by mouth 2 (two) times daily.  30 tablet  0  . Dapagliflozin Propanediol (FARXIGA) 5 MG TABS Take 5 mg by mouth daily.      Marland Kitchen diltiazem (TIAZAC) 300  MG 24 hr capsule Take 300 mg by mouth daily.      . insulin glargine (LANTUS) 100 UNIT/ML injection Inject 20 Units into the skin at bedtime.      . insulin lispro protamine-lispro (HUMALOG 75/25) (75-25) 100 UNIT/ML SUSP injection Inject 45 Units into the skin daily with breakfast.      . lisinopril (PRINIVIL,ZESTRIL) 20 MG tablet Take 20 mg by mouth daily.      . methocarbamol (ROBAXIN) 500 MG tablet Take 1 tablet (500 mg total) by mouth 2 (two) times daily with a meal.  60 tablet  0  . oxyCODONE-acetaminophen (ROXICET) 5-325 MG per tablet Take 1 tablet by mouth every 4 (four) hours as needed for pain.  60 tablet  0  . tamsulosin (FLOMAX) 0.4 MG CAPS capsule Take by mouth daily.       Assessment: 67yom s/p TKA on 8/25 to start Vancomycin for possible knee wound infection. No labs yet for this admission - will give Vancomycin x 1 dose then order regimen once SCr known.  - Weight: 126kg - Recent admission SCr 1.05 (CrCl ~100, normalized CrCl ~70)  Goal of Therapy:  Vancomycin trough level 15-20 mcg/ml  Plan:  1. Vancomycin 2.5g IV x 1 now - will order follow-up doses once SCr known 2. BMET  now   Cleon Dew [phone] 05/10/2013,6:43 PM   Addendum: - SCr 1.13 - CrCl ~92 ml/min (normalized CrCl ~67 ml/min)  Plan: 1. After loading dose, start Vancomycin 1.25g IV q12h - first dose tomorrow AM 2. Monitor renal function, cultures, OR plans and order level at [facility], PharmD Clinical Pharmacist [phone] 05/10/2013, 9:50 PM

## 2013-05-11 ENCOUNTER — Encounter (HOSPITAL_COMMUNITY): Payer: Self-pay | Admitting: *Deleted

## 2013-05-11 ENCOUNTER — Inpatient Hospital Stay (HOSPITAL_COMMUNITY): Payer: BC Managed Care – PPO | Admitting: Anesthesiology

## 2013-05-11 ENCOUNTER — Encounter (HOSPITAL_COMMUNITY): Payer: Self-pay | Admitting: Anesthesiology

## 2013-05-11 ENCOUNTER — Encounter (HOSPITAL_COMMUNITY)
Admission: AD | Disposition: A | Discharge status: Home / Self Care | Payer: Self-pay | Source: Ambulatory Visit | Attending: Orthopedic Surgery

## 2013-05-11 ENCOUNTER — Inpatient Hospital Stay: Admit: 2013-05-11 | Payer: Self-pay | Admitting: Orthopedic Surgery

## 2013-05-11 DIAGNOSIS — T8454XA Infection and inflammatory reaction due to internal left knee prosthesis, initial encounter: Secondary | ICD-10-CM

## 2013-05-11 HISTORY — PX: I & D KNEE WITH POLY EXCHANGE: SHX5024

## 2013-05-11 LAB — GLUCOSE, CAPILLARY: Glucose-Capillary: 114 mg/dL — ABNORMAL HIGH (ref 70–99)

## 2013-05-11 LAB — HEMOGLOBIN A1C
Hgb A1c MFr Bld: 7.1 % — ABNORMAL HIGH (ref <5.7)
Mean Plasma Glucose: 157 mg/dL — ABNORMAL HIGH (ref <117)

## 2013-05-11 LAB — C-REACTIVE PROTEIN: CRP: 19.1 mg/dL — ABNORMAL HIGH (ref <0.60)

## 2013-05-11 LAB — SEDIMENTATION RATE: Sed Rate: 125 mm/hr — ABNORMAL HIGH (ref 0–16)

## 2013-05-11 SURGERY — IRRIGATION AND DEBRIDEMENT KNEE WITH POLY EXCHANGE
Anesthesia: Regional | Site: Knee | Laterality: Left | Wound class: Dirty or Infected

## 2013-05-11 MED ORDER — PHENYLEPHRINE HCL 10 MG/ML IJ SOLN
INTRAMUSCULAR | Status: DC | PRN
  Administered 2013-05-11 (×2): 80 ug via INTRAVENOUS
  Administered 2013-05-11: 120 ug via INTRAVENOUS

## 2013-05-11 MED ORDER — LACTATED RINGERS IV SOLN
INTRAVENOUS | Status: DC
  Administered 2013-05-11 (×2): via INTRAVENOUS

## 2013-05-11 MED ORDER — MIDAZOLAM HCL 2 MG/2ML IJ SOLN
INTRAMUSCULAR | Status: AC
  Administered 2013-05-11: 1 mg via INTRAVENOUS
  Filled 2013-05-11: qty 2

## 2013-05-11 MED ORDER — METOCLOPRAMIDE HCL 10 MG PO TABS: 5.0000 mg | ORAL_TABLET | Freq: Three times a day (TID) | ORAL | Status: DC | PRN

## 2013-05-11 MED ORDER — MENTHOL 3 MG MT LOZG: 1.0000 | LOZENGE | OROMUCOSAL | Status: DC | PRN

## 2013-05-11 MED ORDER — INSULIN ASPART 100 UNIT/ML ~~LOC~~ SOLN
0.0000 [IU] | Freq: Three times a day (TID) | SUBCUTANEOUS | Status: DC
  Administered 2013-05-12: 3 [IU] via SUBCUTANEOUS
  Administered 2013-05-12: 2 [IU] via SUBCUTANEOUS
  Administered 2013-05-12 – 2013-05-13 (×2): 3 [IU] via SUBCUTANEOUS
  Administered 2013-05-13 – 2013-05-16 (×8): 2 [IU] via SUBCUTANEOUS
  Administered 2013-05-16: 3 [IU] via SUBCUTANEOUS

## 2013-05-11 MED ORDER — ARTIFICIAL TEARS OP OINT
TOPICAL_OINTMENT | OPHTHALMIC | Status: DC | PRN
  Administered 2013-05-11: 1 via OPHTHALMIC

## 2013-05-11 MED ORDER — LISINOPRIL 20 MG PO TABS
20.0000 mg | ORAL_TABLET | Freq: Every day | ORAL | Status: DC
  Administered 2013-05-11 – 2013-05-16 (×6): 20 mg via ORAL
  Filled 2013-05-11 (×7): qty 1

## 2013-05-11 MED ORDER — ACETAMINOPHEN 325 MG PO TABS
650.0000 mg | ORAL_TABLET | Freq: Four times a day (QID) | ORAL | Status: DC | PRN
  Administered 2013-05-12: 650 mg via ORAL
  Filled 2013-05-11: qty 2

## 2013-05-11 MED ORDER — FENTANYL CITRATE 0.05 MG/ML IJ SOLN
INTRAMUSCULAR | Status: DC | PRN
  Administered 2013-05-11 (×3): 50 ug via INTRAVENOUS

## 2013-05-11 MED ORDER — ONDANSETRON HCL 4 MG PO TABS: 4.0000 mg | ORAL_TABLET | Freq: Four times a day (QID) | ORAL | Status: DC | PRN

## 2013-05-11 MED ORDER — HYDROGEN PEROXIDE 3 % EX SOLN
CUTANEOUS | Status: DC | PRN
  Administered 2013-05-11: 1

## 2013-05-11 MED ORDER — ACETAMINOPHEN 650 MG RE SUPP: 650.0000 mg | Freq: Four times a day (QID) | RECTAL | Status: DC | PRN

## 2013-05-11 MED ORDER — HYDROMORPHONE HCL PF 1 MG/ML IJ SOLN
INTRAMUSCULAR | Status: DC | PRN
  Administered 2013-05-11: 1 mg via INTRAVENOUS

## 2013-05-11 MED ORDER — SUCCINYLCHOLINE CHLORIDE 20 MG/ML IJ SOLN
INTRAMUSCULAR | Status: DC | PRN
  Administered 2013-05-11: 140 mg via INTRAVENOUS

## 2013-05-11 MED ORDER — DOCUSATE SODIUM 100 MG PO CAPS
100.0000 mg | ORAL_CAPSULE | Freq: Two times a day (BID) | ORAL | Status: DC
  Administered 2013-05-11 – 2013-05-16 (×8): 100 mg via ORAL
  Filled 2013-05-11 (×12): qty 1

## 2013-05-11 MED ORDER — 0.9 % SODIUM CHLORIDE (POUR BTL) OPTIME
TOPICAL | Status: DC | PRN
  Administered 2013-05-11: 1000 mL

## 2013-05-11 MED ORDER — STERILE WATER FOR IRRIGATION IR SOLN
Status: DC | PRN
  Administered 2013-05-11: 1000 mL

## 2013-05-11 MED ORDER — OXYCODONE HCL 5 MG PO TABS
5.0000 mg | ORAL_TABLET | ORAL | Status: DC | PRN
  Administered 2013-05-11 – 2013-05-16 (×16): 10 mg via ORAL
  Filled 2013-05-11 (×17): qty 2

## 2013-05-11 MED ORDER — OXYCHLOROSENE SODIUM POWD
Status: DC
  Filled 2013-05-11: qty 2

## 2013-05-11 MED ORDER — DILTIAZEM HCL ER BEADS 300 MG PO CP24
300.0000 mg | ORAL_CAPSULE | Freq: Every day | ORAL | Status: DC
Start: 2013-05-11 — End: 2013-05-16
  Administered 2013-05-11 – 2013-05-16 (×6): 300 mg via ORAL
  Filled 2013-05-11 (×6): qty 1

## 2013-05-11 MED ORDER — ONDANSETRON HCL 4 MG/2ML IJ SOLN: 4.0000 mg | Freq: Four times a day (QID) | INTRAMUSCULAR | Status: DC | PRN

## 2013-05-11 MED ORDER — OXYCODONE HCL 5 MG/5ML PO SOLN: 5.0000 mg | Freq: Once | ORAL | Status: AC | PRN

## 2013-05-11 MED ORDER — OXYCODONE HCL 5 MG PO TABS: 5.0000 mg | ORAL_TABLET | Freq: Once | ORAL | Status: AC | PRN

## 2013-05-11 MED ORDER — KCL IN DEXTROSE-NACL 20-5-0.45 MEQ/L-%-% IV SOLN
INTRAVENOUS | Status: DC
  Administered 2013-05-11 – 2013-05-13 (×4): via INTRAVENOUS
  Filled 2013-05-11 (×9): qty 1000

## 2013-05-11 MED ORDER — HYDROMORPHONE HCL PF 1 MG/ML IJ SOLN: 0.5000 mg | INTRAMUSCULAR | Status: DC | PRN

## 2013-05-11 MED ORDER — TAMSULOSIN HCL 0.4 MG PO CAPS
0.4000 mg | ORAL_CAPSULE | Freq: Every day | ORAL | Status: DC
  Administered 2013-05-11 – 2013-05-16 (×6): 0.4 mg via ORAL
  Filled 2013-05-11 (×6): qty 1

## 2013-05-11 MED ORDER — PROPOFOL 10 MG/ML IV BOLUS
INTRAVENOUS | Status: DC | PRN
  Administered 2013-05-11: 300 mg via INTRAVENOUS

## 2013-05-11 MED ORDER — SODIUM CHLORIDE 0.9 % IR SOLN
Status: DC | PRN
  Administered 2013-05-11: 3000 mL

## 2013-05-11 MED ORDER — FENTANYL CITRATE 0.05 MG/ML IJ SOLN
INTRAMUSCULAR | Status: AC
  Administered 2013-05-11: 50 ug via INTRAVENOUS
  Filled 2013-05-11: qty 2

## 2013-05-11 MED ORDER — MAGNESIUM CITRATE PO SOLN
1.0000 | Freq: Once | ORAL | Status: AC | PRN
  Filled 2013-05-11: qty 296

## 2013-05-11 MED ORDER — METHOCARBAMOL 500 MG PO TABS
500.0000 mg | ORAL_TABLET | Freq: Four times a day (QID) | ORAL | Status: DC | PRN
  Administered 2013-05-11 – 2013-05-16 (×8): 500 mg via ORAL
  Filled 2013-05-11 (×6): qty 1

## 2013-05-11 MED ORDER — BUPIVACAINE-EPINEPHRINE PF 0.5-1:200000 % IJ SOLN
INTRAMUSCULAR | Status: DC | PRN
  Administered 2013-05-11: 25 mL

## 2013-05-11 MED ORDER — DAPAGLIFLOZIN PROPANEDIOL 5 MG PO TABS
5.0000 mg | ORAL_TABLET | Freq: Every day | ORAL | Status: DC
  Administered 2013-05-13 – 2013-05-16 (×4): 5 mg via ORAL

## 2013-05-11 MED ORDER — LIDOCAINE HCL (CARDIAC) 20 MG/ML IV SOLN
INTRAVENOUS | Status: DC | PRN
  Administered 2013-05-11: 100 mg via INTRAVENOUS

## 2013-05-11 MED ORDER — DIPHENHYDRAMINE HCL 12.5 MG/5ML PO ELIX: 12.5000 mg | ORAL_SOLUTION | ORAL | Status: DC | PRN

## 2013-05-11 MED ORDER — METOCLOPRAMIDE HCL 5 MG/ML IJ SOLN: 5.0000 mg | Freq: Three times a day (TID) | INTRAMUSCULAR | Status: DC | PRN

## 2013-05-11 MED ORDER — BISACODYL 5 MG PO TBEC
5.0000 mg | DELAYED_RELEASE_TABLET | Freq: Every day | ORAL | Status: DC | PRN
  Administered 2013-05-11 – 2013-05-15 (×2): 5 mg via ORAL
  Filled 2013-05-11 (×2): qty 1

## 2013-05-11 MED ORDER — ALUM & MAG HYDROXIDE-SIMETH 200-200-20 MG/5ML PO SUSP: 30.0000 mL | ORAL | Status: DC | PRN

## 2013-05-11 MED ORDER — PHENOL 1.4 % MT LIQD: 1.0000 | OROMUCOSAL | Status: DC | PRN

## 2013-05-11 MED ORDER — ASPIRIN EC 325 MG PO TBEC
325.0000 mg | DELAYED_RELEASE_TABLET | Freq: Every day | ORAL | Status: DC
  Administered 2013-05-12 – 2013-05-16 (×5): 325 mg via ORAL
  Filled 2013-05-11 (×6): qty 1

## 2013-05-11 MED ORDER — SENNOSIDES-DOCUSATE SODIUM 8.6-50 MG PO TABS
1.0000 | ORAL_TABLET | Freq: Every evening | ORAL | Status: DC | PRN
  Filled 2013-05-11: qty 1

## 2013-05-11 MED ORDER — HYDROMORPHONE HCL PF 1 MG/ML IJ SOLN: 0.2500 mg | INTRAMUSCULAR | Status: DC | PRN

## 2013-05-11 MED ORDER — OXYCHLOROSENE SODIUM POWD
Status: DC | PRN
  Administered 2013-05-11: 1

## 2013-05-11 MED ORDER — METHOCARBAMOL 100 MG/ML IJ SOLN
500.0000 mg | Freq: Four times a day (QID) | INTRAVENOUS | Status: DC | PRN
  Filled 2013-05-11: qty 5

## 2013-05-11 SURGICAL SUPPLY — 63 items
BANDAGE ELASTIC 4 VELCRO ST LF (GAUZE/BANDAGES/DRESSINGS) ×2 IMPLANT
BANDAGE ELASTIC 6 VELCRO ST LF (GAUZE/BANDAGES/DRESSINGS) ×2 IMPLANT
BANDAGE GAUZE ELAST BULKY 4 IN (GAUZE/BANDAGES/DRESSINGS) ×2 IMPLANT
BLADE SURG 10 STRL SS (BLADE) ×2 IMPLANT
BNDG COHESIVE 4X5 TAN STRL (GAUZE/BANDAGES/DRESSINGS) ×2 IMPLANT
CLOTH BEACON ORANGE TIMEOUT ST (SAFETY) ×2 IMPLANT
CONT SPEC 4OZ CLIKSEAL STRL BL (MISCELLANEOUS) ×1 IMPLANT
COVER SURGICAL LIGHT HANDLE (MISCELLANEOUS) ×2 IMPLANT
CUFF TOURNIQUET SINGLE 18IN (TOURNIQUET CUFF) ×1 IMPLANT
CUFF TOURNIQUET SINGLE 24IN (TOURNIQUET CUFF) IMPLANT
CUFF TOURNIQUET SINGLE 34IN LL (TOURNIQUET CUFF) ×1 IMPLANT
CUFF TOURNIQUET SINGLE 44IN (TOURNIQUET CUFF) IMPLANT
DRAPE EXTREMITY T 121X128X90 (DRAPE) ×1 IMPLANT
DRSG PAD ABDOMINAL 8X10 ST (GAUZE/BANDAGES/DRESSINGS) ×1 IMPLANT
DURAPREP 26ML APPLICATOR (WOUND CARE) ×2 IMPLANT
ELECT REM PT RETURN 9FT ADLT (ELECTROSURGICAL) ×2
ELECTRODE REM PT RTRN 9FT ADLT (ELECTROSURGICAL) IMPLANT
EVACUATOR 1/8 PVC DRAIN (DRAIN) ×1 IMPLANT
EVACUATOR 3/16  PVC DRAIN (DRAIN) ×1
EVACUATOR 3/16 PVC DRAIN (DRAIN) IMPLANT
FACESHIELD LNG OPTICON STERILE (SAFETY) ×4 IMPLANT
GAUZE XEROFORM 1X8 LF (GAUZE/BANDAGES/DRESSINGS) ×2 IMPLANT
GLOVE BIO SURGEON STRL SZ7.5 (GLOVE) ×2 IMPLANT
GLOVE BIO SURGEON STRL SZ8.5 (GLOVE) ×2 IMPLANT
GLOVE BIOGEL PI IND STRL 8 (GLOVE) ×1 IMPLANT
GLOVE BIOGEL PI IND STRL 9 (GLOVE) ×1 IMPLANT
GLOVE BIOGEL PI INDICATOR 8 (GLOVE) ×1
GLOVE BIOGEL PI INDICATOR 9 (GLOVE) ×1
GOWN STRL NON-REIN LRG LVL3 (GOWN DISPOSABLE) ×6 IMPLANT
GOWN STRL REIN XL XLG (GOWN DISPOSABLE) ×2 IMPLANT
HANDPIECE INTERPULSE COAX TIP (DISPOSABLE) ×2
HYDROGEN PEROXIDE 16OZ (MISCELLANEOUS) ×1 IMPLANT
KIT BASIN OR (CUSTOM PROCEDURE TRAY) ×2 IMPLANT
KIT ROOM TURNOVER OR (KITS) ×2 IMPLANT
MANIFOLD NEPTUNE II (INSTRUMENTS) ×2 IMPLANT
NS IRRIG 1000ML POUR BTL (IV SOLUTION) ×2 IMPLANT
PACK ORTHO EXTREMITY (CUSTOM PROCEDURE TRAY) ×1 IMPLANT
PACK TOTAL JOINT (CUSTOM PROCEDURE TRAY) ×1 IMPLANT
PAD ARMBOARD 7.5X6 YLW CONV (MISCELLANEOUS) ×4 IMPLANT
PAD CAST 4YDX4 CTTN HI CHSV (CAST SUPPLIES) IMPLANT
PADDING CAST COTTON 4X4 STRL (CAST SUPPLIES) ×2
PADDING CAST COTTON 6X4 STRL (CAST SUPPLIES) ×1 IMPLANT
PENCIL BUTTON HOLSTER BLD 10FT (ELECTRODE) IMPLANT
PLATE ROT INSERT 10MM SIZE 5 (Plate) ×1 IMPLANT
SET HNDPC FAN SPRY TIP SCT (DISPOSABLE) IMPLANT
SPONGE GAUZE 4X4 12PLY (GAUZE/BANDAGES/DRESSINGS) ×2 IMPLANT
SPONGE LAP 18X18 X RAY DECT (DISPOSABLE) ×2 IMPLANT
STAPLER VISISTAT 35W (STAPLE) ×1 IMPLANT
STOCKINETTE IMPERVIOUS 9X36 MD (GAUZE/BANDAGES/DRESSINGS) ×2 IMPLANT
SUT ETHILON 3 0 PS 1 (SUTURE) IMPLANT
SUT VIC AB 0 CT1 27 (SUTURE) ×2
SUT VIC AB 0 CT1 27XBRD ANBCTR (SUTURE) IMPLANT
SUT VIC AB 2-0 CT1 27 (SUTURE) ×4
SUT VIC AB 2-0 CT1 TAPERPNT 27 (SUTURE) IMPLANT
SWAB COLLECTION DEVICE MRSA (MISCELLANEOUS) ×1 IMPLANT
SYR BULB IRRIGATION 50ML (SYRINGE) ×1 IMPLANT
TOWEL OR 17X24 6PK STRL BLUE (TOWEL DISPOSABLE) ×2 IMPLANT
TOWEL OR 17X26 10 PK STRL BLUE (TOWEL DISPOSABLE) ×2 IMPLANT
TUBE ANAEROBIC SPECIMEN COL (MISCELLANEOUS) ×1 IMPLANT
TUBE CONNECTING 12X1/4 (SUCTIONS) ×2 IMPLANT
UNDERPAD 30X30 INCONTINENT (UNDERPADS AND DIAPERS) ×2 IMPLANT
WATER STERILE IRR 1000ML POUR (IV SOLUTION) ×2 IMPLANT
YANKAUER SUCT BULB TIP NO VENT (SUCTIONS) ×2 IMPLANT

## 2013-05-11 NOTE — Anesthesia Procedure Notes (Addendum)
Anesthesia Regional Block:  Adductor canal block  Pre-Anesthetic Checklist: ,, timeout performed, Correct Patient, Correct Site, Correct Laterality, Correct Procedure, Correct Position, site marked, Risks and benefits discussed,  Surgical consent,  Pre-op evaluation,  At surgeon's request and post-op pain management  Laterality: Left  Prep: chloraprep       Needles:  Injection technique: Single-shot  Needle Type: Echogenic Needle      Needle Gauge: 21 and 21 G    Additional Needles:  Procedures: ultrasound guided (picture in chart) Adductor canal block Narrative:  Start time: 05/11/2013 12:30 PM End time: 05/11/2013 12:39 PM Injection made incrementally with aspirations every 5 mL.  Performed by: Personally  Anesthesiologist: Dr Chaney Malling  Additional Notes: Pt tolerated the procedure well.  Adductor canal block Procedure Name: LMA Insertion Date/Time: 05/11/2013 1:44 PM Performed by: Carmela Rima Pre-anesthesia Checklist: Patient identified, Timeout performed, Emergency Drugs available, Patient being monitored and Suction available Patient Re-evaluated:Patient Re-evaluated prior to inductionOxygen Delivery Method: Circle system utilized Preoxygenation: Pre-oxygenation with 100% oxygen Intubation Type: IV induction Ventilation: Mask ventilation without difficulty LMA: LMA inserted LMA Size: 5.0 Number of attempts: 3 (poor seating) Tube secured with: Tape Dental Injury: Teeth and Oropharynx as per pre-operative assessment     Procedure Name: Intubation Date/Time: 05/11/2013 1:47 PM Performed by: Carmela Rima Pre-anesthesia Checklist: Patient identified, Timeout performed, Emergency Drugs available, Suction available and Patient being monitored Patient Re-evaluated:Patient Re-evaluated prior to inductionOxygen Delivery Method: Circle system utilized Preoxygenation: Pre-oxygenation with 100% oxygen Intubation Type: IV induction Ventilation: Mask ventilation  without difficulty Laryngoscope Size: Mac and 4 Grade View: Grade I Tube type: Oral Tube size: 7.5 mm Number of attempts: 1 Placement Confirmation: ETT inserted through vocal cords under direct vision,  positive ETCO2 and breath sounds checked- equal and bilateral Secured at: 23 cm Tube secured with: Tape Dental Injury: Teeth and Oropharynx as per pre-operative assessment

## 2013-05-11 NOTE — Progress Notes (Signed)
Patient has been transferred to the OR for surgery. Lorretta Harp RN

## 2013-05-11 NOTE — Interval H&P Note (Signed)
History and Physical Interval Note:  05/11/2013 12:47 PM  Edwin Shaffer  has presented today for surgery, with the diagnosis of Infected Left Total Knee  The various methods of treatment have been discussed with the patient and family. After consideration of risks, benefits and other options for treatment, the patient has consented to  Procedure(s): IRRIGATION AND DEBRIDEMENT KNEE WITH POLY EXCHANGE (Left) as a surgical intervention .  The patient's history has been reviewed, patient examined, no change in status, stable for surgery.  I have reviewed the patient's chart and labs.  Questions were answered to the patient's satisfaction.     [name]

## 2013-05-11 NOTE — Progress Notes (Signed)
Nutrition Brief Note  Patient identified on the Malnutrition Screening Tool (MST) Report  Pt in OR. Per record review pt has not had any weight loss x 1 month.   Wt Readings from Last 15 Encounters:  05/10/13 278 lb (126.1 kg)  05/10/13 278 lb (126.1 kg)  05/10/13 276 lb (125.193 kg)  04/24/13 278 lb 3.5 oz (126.2 kg)  04/24/13 278 lb 3.5 oz (126.2 kg)  04/17/13 278 lb 4.8 oz (126.236 kg)    Body mass index is 35.23 kg/(m^2). Patient meets criteria for Obesity Class II based on current BMI.   Current diet order is NPO. Labs and medications reviewed.   No nutrition interventions warranted at this time. If nutrition issues arise, please consult RD.   Kendell Bane RD, LDN, CNSC [phone] Pager [phone] After Hours Pager

## 2013-05-11 NOTE — Anesthesia Postprocedure Evaluation (Signed)
  Anesthesia Post-op Note  Patient: Edwin Shaffer  Procedure(s) Performed: Procedure(s): IRRIGATION AND DEBRIDEMENT KNEE WITH POLY EXCHANGE (Left)  Patient Location: PACU  Anesthesia Type:GA combined with regional for post-op pain  Level of Consciousness: awake, alert , oriented and patient cooperative  Airway and Oxygen Therapy: Patient Spontanous Breathing and Patient connected to nasal cannula oxygen  Post-op Pain: mild  Post-op Assessment: Post-op Vital signs reviewed, Patient's Cardiovascular Status Stable, Respiratory Function Stable, Patent Airway, No signs of Nausea or vomiting and Pain level controlled  Post-op Vital Signs: Reviewed and stable  Complications: No apparent anesthesia complications

## 2013-05-11 NOTE — Op Note (Signed)
PATIENT ID:      Edwin Shaffer  MRN:     [id number] DOB/AGE:    1945-10-18 / 67 y.o.       OPERATIVE REPORT    DATE OF PROCEDURE:  05/11/2013       PREOPERATIVE DIAGNOSIS:   Infected Left Total Knee      Estimated body mass index is 35.23 kg/(m^2) as calculated from the following:   Height as of this encounter: 6' 2.5" (1.892 m).   Weight as of this encounter: 126.1 kg (278 lb).                                                        POSTOPERATIVE DIAGNOSIS:   Infected Left Total Knee                                                                      PROCEDURE: Radical irrigation and debridement of infected total knee arthroplasty with revision of DePuy tibial component rotating platform #5, 10 mm bearing     SURGEON: Timika Muench J    ASSISTANT:   Eric K. [employer]  (present throughout entire procedure and necessary for timely completion of the procedure)  ANESTHESIA: GET   DRAINS: foley, 2 medium hemovac in knee joint, one small Hemovac in the subcutaneous tissue   TOURNIQUET TIME: 75 min   COMPLICATIONS:  None     SPECIMENS: Infected Synovial fluid and tissue for Gram stain and culture.   INDICATIONS FOR PROCEDURE: Patient is status post total knee arthroplasty 04/23/2013, had staple removal in our office 3 days ago and looks good, call yesterday with fever chills and some drainage from the inferior aspect of the wound. I aspirated chart bloody material from the prepatellar bursa that was sent off for Gram stain and culture came back with many polys no growth at 24 hours. The patient was admitted last night receive IV vancomycin, his peripheral white count was 12.6, his sedimentation rate 125, he is taken for radical irrigation and debridement with revision of the polyethylene bearing   DESCRIPTION OF PROCEDURE: The patient identified by armband, and taken to the operating room, appropriate anesthetic  monitors were attached General endotracheal anesthesia induced with  the  patient in supine position, Foley catheter was inserted. Tourniquet  applied high to the operative thigh. Lateral post and foot positioner  applied to the table, the lower extremity was then prepped and draped  in usual sterile fashion from the ankle to the tourniquet. Time-out procedure was performed. The limb was kept elevated during the prep to drain blood without compressing the infected region.  The tourniquet was then inflated to 350 mmHg. We began the operation by making the anterior midline incision starting at handbreadth above the patella going over the patella 1 cm medial to and  6 cm distal to the tibial tubercle, reproducing the old incision. Immediately encountered large amounts of subcutaneous hematoma were evacuated and there was obvious communication distally between the prepatellar space and the joint itself. Small bleeders in the skin and the  subcutaneous tissue identified  and cauterized. A medial parapatellar arthrotomy was accomplished. We immediately encountered dark bloody material material and inflamed synovium and specimens were sent for Gram stain and culture. The patella was subluxed laterally and the superficial medial collateral ligament was then elevated from anterior to posterior along the proximal  flare of the tibia. We then set about sharply excising all inflamed synovium that was visible with the patella everted and the knee flexed.  Polyethylene bearing was then removed sharply with an osteotome.  With the knee hyperflexed.  We sharply removed also inflamed synovium medially laterally and posteriorly, and then irrigated alternating with pulse lavage, Clorpactin for 1 minute intervals, and hydrogen peroxide again for 1 minute intervals. This was continued for 2 full cycles. After thorough irrigation and debridement 2 large bore Hemovac drains were placed in the wound. We then closed in layers with running 0 Vicryl in the capsule, placed a superficial small Hemovac, 2-0  undyed Vicryl in the subcutaneous tissue and skin staples. A dressing of Xeroform, 4 x 4 dressings, sponges, web roll, Ace wrap and a knee immobilizer were then placed. The patient was placed in a knee immobilizer. The patient was then awakened, extubated, and taken to recovery room without difficulty.   [name] J 05/11/2013, 2:57 PM

## 2013-05-11 NOTE — Progress Notes (Signed)
Patient has returned to unit, vital signs are stable and patient's pain level is 7/10. Two Vicodin given; will continue to monitor patient. Lorretta Harp RN

## 2013-05-11 NOTE — Preoperative (Addendum)
Beta Blockers   Reason not to administer Beta Blockers:Not Applicable 

## 2013-05-11 NOTE — Progress Notes (Signed)
Patient states that he would not like to have enema before surgery and would like to wait; will continue to monitor patient. Lorretta Harp RN

## 2013-05-11 NOTE — Anesthesia Preprocedure Evaluation (Signed)
Anesthesia Evaluation  Patient identified by MRN, date of birth, ID band Patient awake    Reviewed: Allergy & Precautions, H&P , NPO status , Patient's Chart, lab work & pertinent test results  Airway Mallampati: II  Neck ROM: full    Dental   Pulmonary former smoker,          Cardiovascular hypertension,     Neuro/Psych    GI/Hepatic   Endo/Other  diabetes, Type 2obese  Renal/GU      Musculoskeletal  (+) Arthritis -,   Abdominal   Peds  Hematology   Anesthesia Other Findings   Reproductive/Obstetrics                           Anesthesia Physical Anesthesia Plan  ASA: III  Anesthesia Plan: General and Regional   Post-op Pain Management: MAC Combined w/ Regional for Post-op pain   Induction: Intravenous  Airway Management Planned: Oral ETT  Additional Equipment:   Intra-op Plan:   Post-operative Plan: Extubation in OR  Informed Consent: I have reviewed the patients History and Physical, chart, labs and discussed the procedure including the risks, benefits and alternatives for the proposed anesthesia with the patient or authorized representative who has indicated his/her understanding and acceptance.     Plan Discussed with: CRNA, Anesthesiologist and Surgeon  Anesthesia Plan Comments:         Anesthesia Quick Evaluation
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
## 2013-05-11 NOTE — Progress Notes (Signed)
PATIENT ID: Edwin Shaffer  MRN: [id number]  DOB/AGE:  [birth year] / 67 y.o.        PROGRESS NOTE Subjective: Patient is alert, oriented, no Nausea, no Vomiting, yes passing gas, no Bowel Movement. Taking PO NPO. Denies SOB, Chest or Calf Pain. Patient was able to sleep last night and got his IV vancomycin before 9:00 in the evening. Gram stain and cultures on the office are pending. Ambulate WBAT, CPM N/A Patient reports pain as 2 on 0-10 scale  .    Objective: Vital signs in last 24 hours: Filed Vitals:   05/10/13 2211 05/10/13 2300 05/10/13 2357 05/11/13 0616  BP: 133/75   129/77  Pulse: 95   89  Temp: 101.2 F (38.4 C)  99.1 F (37.3 C) 99.6 F (37.6 C)  TempSrc: Oral  Oral Oral  Resp: 18   18  Height:  6' 2.5" (1.892 m)    Weight:  126.1 kg (278 lb)    SpO2: 99%   100%      Intake/Output from previous day: I/O last 3 completed shifts: In: -  Out: 400 [Urine:400]   Intake/Output this shift:     LABORATORY DATA:  Recent Labs  05/10/13 2031 05/10/13 2200 05/10/13 2300 05/11/13 0135 05/11/13 0408  WBC  --   --  12.2*  --   --   HGB  --   --  9.3*  --   --   HCT  --   --  27.3*  --   --   PLT  --   --  544*  --   --   NA 134*  --   --   --   --   K 4.3  --   --   --   --   CL 100  --   --   --   --   CO2 23  --   --   --   --   BUN 20  --   --   --   --   CREATININE 1.13  --   --   --   --   GLUCOSE 100*  --   --   --   --   GLUCAP  --  129*  --  108* 114*  INR 1.16  --   --   --   --   CALCIUM 9.4  --   --   --   --     Examination: Neurologically intact ABD soft Neurovascular intact Sensation intact distally Intact pulses distally Dorsiflexion/Plantar flexion intact Incision: moderate drainage No cellulitis present Compartment soft}  Assessment:     based on the patient's clinical or examination, fever, elevated white count, sedimentation rate of 125 and fluid found in the prepatellar bursa in the office yesterday with the patient at a minimum  has a superficial wound infection and probably a deep wound infection. Of interest he is able walk without much discomfort and his range of motion remains at 5-95 without too much pain. If this is a deep wound infection it is relatively early in may do very well with a radical irrigation and debridement and polyethylene exchange. That is the plan going forward today. Gram stain and cultures in the office at 4 PM yesterday are still pending but we should have them when he goes to surgery around 1:00 this afternoon. ADDITIONAL DIAGNOSIS:  Diabetes  Plan: PT/OT WBAT, CPM 5/hrs day until ROM 0-90 degrees, then D/C CPM  DVT Prophylaxis:  SCDx72hrs, ASA 325 mg BID x 2 weeks DISCHARGE PLAN: Home DISCHARGE NEEDS: HHPT, HHRN, CPM, Walker, 3-in-1 comode seat and IV Antibiotics     Johnrobert Foti J 05/11/2013, 7:10 AM

## 2013-05-11 NOTE — Transfer of Care (Signed)
Immediate Anesthesia Transfer of Care Note  Patient: Edwin Shaffer  Procedure(s) Performed: Procedure(s): IRRIGATION AND DEBRIDEMENT KNEE WITH POLY EXCHANGE (Left)  Patient Location: PACU  Anesthesia Type:General  Level of Consciousness: awake and oriented  Airway & Oxygen Therapy: Patient Spontanous Breathing and Patient connected to nasal cannula oxygen  Post-op Assessment: Report given to PACU RN, Post -op Vital signs reviewed and stable and Patient moving all extremities X 4  Post vital signs: Reviewed and stable  Complications: No apparent anesthesia complications

## 2013-05-12 LAB — CBC
HCT: 25.9 % — ABNORMAL LOW (ref 39.0–52.0)
RBC: 2.93 MIL/uL — ABNORMAL LOW (ref 4.22–5.81)
RDW: 13.3 % (ref 11.5–15.5)
WBC: 9.7 10*3/uL (ref 4.0–10.5)

## 2013-05-12 LAB — GLUCOSE, CAPILLARY
Glucose-Capillary: 140 mg/dL — ABNORMAL HIGH (ref 70–99)
Glucose-Capillary: 169 mg/dL — ABNORMAL HIGH (ref 70–99)

## 2013-05-12 MED ORDER — WHITE PETROLATUM GEL
Status: AC
  Administered 2013-05-12: 07:00:00
  Filled 2013-05-12: qty 5

## 2013-05-12 NOTE — Progress Notes (Signed)
Subjective: POD 1 I&D L TKA infection with revision of tibial component C/o back pain, wants to move to sit in chair, uncomfortable being in same position Tol PO.   Objective: Vital signs in last 24 hours: Temp:  [98.2 F (36.8 C)-100.5 F (38.1 C)] 100.5 F (38.1 C) (09/13 0536) Pulse Rate:  [84-107] 107 (09/13 0536) Resp:  [16-25] 18 (09/13 0536) BP: (123-182)/(67-97) 147/67 mmHg (09/13 0536) SpO2:  [94 %-100 %] 100 % (09/13 0536) FiO2 (%):  [44 %] 44 % (09/12 1632)  Intake/Output from previous day: 09/12 0701 - 09/13 0700 In: 1660 [P.O.:360; I.V.:1200] Out: 530 [Urine:500; Drains:30] Intake/Output this shift:     Recent Labs  05/10/13 2300 05/12/13 0408  HGB 9.3* 8.5*    Recent Labs  05/10/13 2300 05/12/13 0408  WBC 12.2* 9.7  RBC 3.12* 2.93*  HCT 27.3* 25.9*  PLT 544* 451*    Recent Labs  05/10/13 2031  NA 134*  K 4.3  CL 100  CO2 23  BUN 20  CREATININE 1.13  GLUCOSE 100*  CALCIUM 9.4    Recent Labs  05/10/13 2031  INR 1.16    Sensation intact distally Intact pulses distally Dorsiflexion/Plantar flexion intact dressing clean/dry externally  Assessment/Plan: Up with PT today Plan to d/c small drain tomorrow, large drain waiting until Monday    Suleman Gunning A. 05/12/2013, 7:43 AM

## 2013-05-12 NOTE — Evaluation (Signed)
Occupational Therapy Evaluation Patient Details Name: Edwin Shaffer MRN: [id number] DOB: 27-Feb-1946 Today's Date: 05/12/2013 Time: 1411-1440 OT Time Calculation (min): 29 min  OT Assessment / Plan / Recommendation History of present illness 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; 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    Clinical Impression   Patient is s/p I&D of Lt infected knee replacement surgery resulting in functional limitations due to the deficits listed below (see OT Problem List). Pt recently discharged from SNF and was able to bathe and dress himself according to pt. Patient will benefit from skilled OT to increase their independence and safety with to allow discharge to the venue listed below.      OT Assessment  Patient needs continued OT Services    Follow Up Recommendations  Home health OT;Supervision/Assistance - 24 hour    Barriers to Discharge      Equipment Recommendations  None recommended by OT    Recommendations for Other Services    Frequency  Min 2X/week    Precautions / Restrictions Precautions Precautions: Knee;Fall Precaution Comments: Explained to have knee immobilizer on at all times. Required Braces or Orthoses: Knee Immobilizer - Left Knee Immobilizer - Left: On at all times Restrictions Weight Bearing Restrictions: Yes LLE Weight Bearing: Weight bearing as tolerated   Pertinent Vitals/Pain Pain in back. Repositioned and notified nurse.     ADL  Grooming: Wash/dry hands;Teeth care;Min guard Where Assessed - Grooming: Supported standing Upper Body Bathing: Set up;Supervision/safety Where Assessed - Upper Body Bathing: Supported sitting Lower Body Bathing: Moderate assistance Where Assessed - Lower Body Bathing: Supported sit to stand Upper Body Dressing: Set up;Supervision/safety Where Assessed - Upper Body Dressing: Supported sitting Lower Body Dressing: Moderate assistance Where Assessed - Lower Body Dressing: Supported sit to stand Toilet Transfer: Photographer Method: Sit to stand;Other (comment) (also standing) Toilet Transfer Equipment: Other (comment);Comfort height toilet (stood to urinate;also sit to stand from recliner chair) Toileting - Architect and Hygiene: Min guard Where Assessed - Engineer, mining and Hygiene: Standing Tub/Shower Transfer Method: Not assessed Equipment Used: Gait belt;Knee Immobilizer;Rolling walker Transfers/Ambulation Related to ADLs: Min guard for ambulation and transfers. ADL Comments: Pt ambulated to bathroom to urinate- Min guard level. Pt brushed teeth at sink and started brushing teeth without toothpaste requiring verbal cues. OT educated on dressing technique and safety with LB dressing and explained use of AE for LB ADLs. Educated on safe shoes and having bag on walker to carry items. Also, educated to have rugs picked up.  OT demonstrated simulated tub/shower transfer technique.  OT wrote down some information that was covered in session and gave to pt.  OT wrote down some information that was covered in session and gave to pt.    OT Diagnosis: Acute pain  OT Problem List: Decreased range of motion;Decreased activity tolerance;Impaired balance (sitting and/or standing);Decreased cognition;Decreased knowledge of use of DME or AE;Pain;Decreased safety awareness OT Treatment Interventions: Self-care/ADL training;DME and/or AE instruction;Therapeutic activities;Patient/family education;Balance training   OT Goals(Current goals can be found in the care plan section) Acute Rehab OT Goals Patient Stated Goal: get well and get out of here OT Goal Formulation: With patient Time For Goal Achievement: 05/19/13 Potential to Achieve Goals: Good ADL Goals Pt Will Perform Lower Body Bathing: with supervision;with adaptive equipment;sit to/from stand Pt Will Perform Lower Body Dressing: with supervision;with adaptive equipment;sit to/from stand Pt Will Transfer to Toilet:  ambulating;with modified independence (3 in 1 over commode) Pt Will Perform Toileting -  Clothing Manipulation and hygiene: sit to/from stand;with modified independence Pt Will Perform Tub/Shower Transfer: Tub transfer;with supervision;ambulating;3 in 1;rolling walker  Visit Information  Last OT Received On: 05/12/13 Assistance Needed: +2 (for safety) History of Present Illness: 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; 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        Prior Functioning     Home Living Family/patient expects to be discharged to:: Private residence Living Arrangements: Spouse/significant other Available Help at Discharge: Family;Available 24 hours/day Type of Home: House Home Access: Stairs to enter [employer] of Steps: 2 Entrance Stairs-Rails: None Home Layout: One level Home Equipment: Walker - 2 wheels;Bedside commode Prior Function Level of Independence: Independent with assistive device(s) Comments: pt had recently spent 16 days at [facility] and was mod i with RW when he was discharged  Communication Communication: No difficulties Dominant Hand: Left         Vision/Perception Vision - History Baseline Vision: Wears glasses only for reading   Cognition  Cognition Arousal/Alertness: Awake/alert Behavior During Therapy: WFL for tasks assessed/performed Overall Cognitive Status: Impaired/Different from baseline Area of Impairment: Problem solving Problem Solving: Difficulty sequencing;Requires verbal cues General Comments: Pt started to brush teeth without toothpaste.    Extremity/Trunk Assessment Upper Extremity Assessment Upper Extremity Assessment: Overall WFL for tasks assessed     Mobility Bed Mobility Bed Mobility: Not assessed Transfers Transfers: Sit to Stand;Stand to Sit Sit to Stand: 4: Min guard;With upper extremity assist;With armrests;From chair/3-in-1 Stand to Sit: 4: Min guard;With upper extremity assist;With armrests;To chair/3-in-1 Details  for Transfer Assistance: Min guard for safety. Cues for hand placement.     Exercise     Balance     End of Session OT - End of Session Equipment Utilized During Treatment: Gait belt;Rolling walker Activity Tolerance: Patient tolerated treatment well;Patient limited by fatigue Patient left: in chair;with call bell/phone within reach CPM Left Knee CPM Left Knee: Off  GO     Earlie Raveling OTR/L [phone] 05/12/2013, 4:33 PM

## 2013-05-12 NOTE — Evaluation (Addendum)
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  Patient will benefit from skilled PT to increase their independence and safety with mobility to allow discharge to the venue listed below. Pt highly agitated and demo some cognitive deficits this session which are different from baseline per CNA. Pt limited in mobility due to dizziness. Will cont to plan for HHPT pending cognition deficits reside.      PT Assessment  Patient needs continued PT services    Follow Up Recommendations  Home health PT;Supervision/Assistance - 24 hour    Does the patient have the potential to tolerate intense rehabilitation      Barriers to Discharge        Equipment Recommendations  None recommended by PT    Recommendations for Other Services OT consult   Frequency 7X/week    Precautions / Restrictions Precautions Precautions: Knee;Fall Required Braces or Orthoses: Knee Immobilizer - Left Knee Immobilizer - Left: On at all times Restrictions Weight Bearing Restrictions: Yes LLE Weight Bearing: Weight bearing as tolerated   Pertinent Vitals/Pain 10/10 RN provided medication to assist with pain control       Mobility  Bed Mobility Bed Mobility: Supine to Sit;Sitting - Scoot to Edge of Bed Supine to Sit: 4: Min guard;HOB elevated;With rails Sitting - Scoot to Edge of Bed: 4: Min guard;With rail Details for Bed Mobility Assistance: min guard to initiate and facilitate advancement of Lt LE to/off EOB; pt required incr time and slow one step commands to follow sequencing   Transfers Transfers: Sit to Stand;Stand to Sit;Stand Pivot Transfers Sit to Stand: 4: Min assist;From bed;From elevated surface Stand to Sit: 4: Min assist;To chair/3-in-1;With armrests Stand Pivot Transfers: 3: Mod assist;From elevated surface;With armrests Details for Transfer Assistance: pt very unsteady with transfer and requires max cues and encouragement; pt continuously stating "im going to fall"; pt demo fear of falling and decr knowledge of WBAT precautions; pt given cues for hand placement and sequencing with RW  Ambulation/Gait Ambulation/Gait Assistance: Not tested (comment) (pt was refusing and stating he wanted a wheelchair ) General Gait Details: pt c/o dizziniess with transfers; will have 2+ for safety with amb Stairs: No Wheelchair Mobility Wheelchair Mobility: No    Exercises Total Joint Exercises Ankle Circles/Pumps: AROM;Both;10 reps;Supine;Seated   PT Diagnosis: Difficulty walking;Acute pain  PT Problem List: Decreased strength;Decreased range of motion;Decreased activity tolerance;Decreased balance;Decreased mobility;Decreased knowledge of use of DME;Decreased safety awareness;Pain PT Treatment Interventions: DME instruction;Gait training;Functional mobility training;Therapeutic activities;Therapeutic exercise;Neuromuscular re-education;Patient/family education     PT Goals(Current goals can be found in the care plan section) Acute Rehab PT Goals Patient Stated Goal: to get out of the bed  PT Goal Formulation: With patient Time For Goal Achievement: 05/19/13 Potential to Achieve Goals: Good  Visit Information  Last PT Received On: 05/12/13 Assistance Needed: +2 (for safety) History of Present Illness: 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; 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        Prior Functioning  Home Living Family/patient expects to be discharged to:: Private residence Living Arrangements: Spouse/significant other Available Help at Discharge: Family;Available 24  hours/day Type of Home: House Home Access: Stairs to enter [employer] of Steps: 2 Entrance Stairs-Rails: None Home Layout: One level Home Equipment: Walker - 2 wheels;Bedside commode Additional Comments: has walk in shower; was recently D/C home from [facility] and Mod I per pt; pt with difficulty answering questions; becoming agitated at PLOF questions and home setup questions  Prior Function Level of Independence: Independent with assistive device(s) Comments: pt had recently spent 16 days at [facility] and was mod i with RW when he was discharged  Communication Communication: No difficulties Dominant Hand: Left    Cognition  Cognition Arousal/Alertness: Awake/alert Behavior During Therapy: Agitated Overall Cognitive Status: Impaired/Different from baseline Area of Impairment: Memory;Following commands;Safety/judgement;Problem solving Following Commands: Follows one step commands inconsistently;Follows one step commands with increased time Safety/Judgement: Decreased awareness of safety;Decreased awareness of deficits Problem Solving: Slow processing;Decreased initiation;Difficulty sequencing;Requires verbal cues;Requires tactile cues General Comments: Pt speaking out of ordinary per CNA; pt very distractable and easily agitated with questions     Extremity/Trunk Assessment Upper Extremity Assessment Upper Extremity Assessment: Defer to OT evaluation Lower Extremity Assessment Lower Extremity Assessment: LLE deficits/detail LLE: Unable to fully assess due to pain;Unable to fully assess due to immobilization Cervical / Trunk Assessment Cervical / Trunk Assessment: Normal   Balance Balance Balance Assessed: Yes Static Sitting Balance Static Sitting - Balance Support: Bilateral upper extremity supported;Feet supported Static Sitting - Level of Assistance: 5: Stand by assistance Static Sitting - Comment/# of Minutes: tolerated sitting EOB ~ 4 min; c/o dizziniess    End of Session PT - End of Session Equipment Utilized During Treatment: Gait belt;Left knee immobilizer Activity Tolerance: Other (comment) (c/o dizziniess with standing ) Patient left: in chair;with call bell/phone within reach Nurse Communication: Mobility status CPM Left Knee CPM Left Knee: [phone]  GP     Donnamarie Poag Chacra,  [phone] 05/12/2013, 9:48 AM

## 2013-05-12 NOTE — Progress Notes (Signed)
Home medication counted and stored in pharmacy to be dispensed.  Patient states he will resume in am.

## 2013-05-13 LAB — CBC
MCHC: 33.3 g/dL (ref 30.0–36.0)
MCV: 88.2 fL (ref 78.0–100.0)
Platelets: 407 10*3/uL — ABNORMAL HIGH (ref 150–400)
RDW: 13.4 % (ref 11.5–15.5)
WBC: 9.8 10*3/uL (ref 4.0–10.5)

## 2013-05-13 LAB — GLUCOSE, CAPILLARY: Glucose-Capillary: 161 mg/dL — ABNORMAL HIGH (ref 70–99)

## 2013-05-13 LAB — BASIC METABOLIC PANEL
BUN: 15 mg/dL (ref 6–23)
Calcium: 8.8 mg/dL (ref 8.4–10.5)
Creatinine, Ser: 1.03 mg/dL (ref 0.50–1.35)
GFR calc non Af Amer: 74 mL/min — ABNORMAL LOW (ref >90)
Glucose, Bld: 180 mg/dL — ABNORMAL HIGH (ref 70–99)
Sodium: 132 mEq/L — ABNORMAL LOW (ref 135–145)

## 2013-05-13 MED ORDER — SODIUM CHLORIDE 0.9 % IJ SOLN
10.0000 mL | INTRAMUSCULAR | Status: DC | PRN
  Administered 2013-05-14 – 2013-05-16 (×3): 10 mL

## 2013-05-13 MED ORDER — SODIUM CHLORIDE 0.9 % IJ SOLN
10.0000 mL | Freq: Two times a day (BID) | INTRAMUSCULAR | Status: DC
  Administered 2013-05-13: 10 mL

## 2013-05-13 NOTE — Progress Notes (Signed)
Objective: Vital signs in last 24 hours: Temp:  [98.4 F (36.9 C)-98.9 F (37.2 C)] 98.4 F (36.9 C) (09/14 0617) Pulse Rate:  [88-91] 88 (09/14 0617) Resp:  [18-20] 20 (09/14 1113) BP: (132-140)/(66-72) 132/72 mmHg (09/14 0617) SpO2:  [99 %-100 %] 99 % (09/14 1113)  Intake/Output from previous day: 09/13 0701 - 09/14 0700 In: 840 [P.O.:840] Out: 1055 [Urine:1025; Drains:30] Intake/Output this shift: Total I/O In: 490 [P.O.:240; IV Piggyback:250] Out: 850 [Urine:850]   Recent Labs  05/10/13 2300 05/12/13 0408 05/13/13 0500  HGB 9.3* 8.5* 7.7*    Recent Labs  05/12/13 0408 05/13/13 0500  WBC 9.7 9.8  RBC 2.93* 2.62*  HCT 25.9* 23.1*  PLT 451* 407*    Recent Labs  05/10/13 2031 05/13/13 0900  NA 134* 132*  K 4.3 4.4  CL 100 101  CO2 23 22  BUN 20 15  CREATININE 1.13 1.03  GLUCOSE 100* 180*  CALCIUM 9.4 8.8    Recent Labs  05/10/13 2031  INR 1.16     Assessment/Plan: Na down at 132, Hgb 7.7 Will heplock ivfs and check labs in am  Edwin Shaffer A. 05/13/2013, 3:41 PM

## 2013-05-13 NOTE — Progress Notes (Signed)
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 Requires incr time and mod- max cues for sequencing and safety. Pt plans to D/C home Tuesday. Will need 24/7 (A), which he states he has at home and need to address stair amb. Cont per POC till D/C.   Follow Up Recommendations  Home health PT;Supervision/Assistance - 24 hour     Does the patient have the potential to tolerate intense rehabilitation     Barriers to Discharge        Equipment Recommendations  None recommended by PT    Recommendations for Other Services    Frequency 7X/week   Progress towards PT Goals Progress towards PT goals: Progressing toward goals  Plan Current plan remains appropriate    Precautions / Restrictions Precautions Precautions: Knee;Fall Precaution Comments: Explained to have knee immobilizer on at all times. Required Braces or Orthoses: Knee Immobilizer - Left Knee Immobilizer - Left: On at all times Restrictions Weight Bearing Restrictions: Yes LLE Weight Bearing: Weight bearing as tolerated   Pertinent Vitals/Pain >10/10 in back; 0/10 in Lt knee     Mobility  Bed Mobility Bed Mobility: Not assessed Transfers Transfers: Sit to Stand;Stand to Sit;Stand Pivot Transfers Sit to Stand: 4: Min guard;From chair/3-in-1;With armrests Stand to Sit: 4: Min guard;To chair/3-in-1;With armrests Details for Transfer Assistance: min guard to steady and for safety; cues for hand placement and sequencing for sit <> stand transfers Ambulation/Gait Ambulation/Gait Assistance: 4: Min guard Ambulation Distance (Feet): 100 Feet Assistive device: Rolling walker Ambulation/Gait  Assistance Details: cues for gt sequencing and upright posture; pt amb with continuous flexed trunk; required multiple rest breaks due to fatigue and demo SOB with amb.  Gait Pattern: Step-to pattern;Wide base of support Gait velocity: decreased Stairs: No Wheelchair Mobility Wheelchair Mobility: No    Exercises Total Joint Exercises Ankle Circles/Pumps: AROM;Both;10 reps;Supine;Seated Straight Leg Raises: AROM;Left;Seated;5 reps   PT Diagnosis:    PT Problem List:   PT Treatment Interventions:     PT Goals (current goals can now be found in the care plan section) Acute Rehab PT Goals Patient Stated Goal: to go home on Tuesday like my Doctor said PT Goal Formulation: With patient Time For Goal Achievement: 05/19/13 Potential to Achieve Goals: Good  Visit Information  Last PT Received On: 05/13/13 Assistance Needed: +1 History of Present Illness: 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 Id like to go to the bathroom before we walk in the hall, i need to wash up and pee" Patient Stated Goal: to go home on Tuesday like my Doctor said   Cognition  Cognition Arousal/Alertness: Awake/alert Behavior During Therapy: [facility] for tasks assessed/performed Overall Cognitive Status: Impaired/Different from baseline Area of Impairment: Problem solving Problem Solving: Decreased initiation;Difficulty sequencing;Requires verbal cues;Requires tactile cues;Slow processing General Comments: Pt continues to require slow one step commands and incr time for problem solving and simple tasks.     Balance  Balance Balance Assessed: Yes Static Standing Balance Static Standing - Balance Support: Bilateral upper extremity supported;Right upper extremity supported;During functional activity;Left  upper extremity supported Static Standing - Level of Assistance: 5: Stand by assistance Static Standing - Comment/# of  Minutes: pt performed ADLs at sink and toileting; pt required UE support at all times and cues to decr posterior lean; tolerated static standing in bathroom for ~6 min to perform these activities with stand by to min guard (A)   End of Session PT - End of Session Equipment Utilized During Treatment: Gait belt;Left knee immobilizer Activity Tolerance: Patient tolerated treatment well Patient left: in chair;with call bell/phone within reach Nurse Communication: Mobility status   GP     [name], [place] [phone] 05/13/2013, 9:53 AM

## 2013-05-13 NOTE — Progress Notes (Signed)
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 Pt is highly motivated and willing to participate in therapy. Will cont per POC.    Follow Up Recommendations  Home health PT;Supervision/Assistance - 24 hour     Does the patient have the potential to tolerate intense rehabilitation     Barriers to Discharge        Equipment Recommendations  None recommended by PT    Recommendations for Other Services OT consult  Frequency 7X/week   Progress towards PT Goals Progress towards PT goals: Progressing toward goals  Plan Current plan remains appropriate    Precautions / Restrictions Precautions Precautions: Knee;Fall Precaution Comments: Explained to have knee immobilizer on at all times. Required Braces or Orthoses: Knee Immobilizer - Left Knee Immobilizer - Left: On at all times Restrictions Weight Bearing Restrictions: Yes LLE Weight Bearing: Weight bearing as tolerated   Pertinent Vitals/Pain "My back is all that ever hurts"    Mobility  Bed Mobility Bed Mobility: Not assessed Transfers Transfers: Sit to Stand;Stand to Sit;Stand Pivot Transfers Sit to Stand: 4: Min guard;From chair/3-in-1;With armrests Stand to Sit: 4: Min guard;To chair/3-in-1;With armrests Details for Transfer Assistance: min guard for safety; cues for hand placement and to control descent; pt flopped in chair; demo decr safety awareness Ambulation/Gait Ambulation/Gait Assistance: 4: Min guard Ambulation Distance (Feet): 125 Feet Assistive device: Rolling walker Ambulation/Gait Assistance Details: mod cues for upright posture  and gt sequencing; pt required 3 rest breaks due to SOB this session; O2 stat at 98% Gait Pattern: Step-to pattern;Wide base of support Gait velocity: decreased Stairs: No Wheelchair Mobility Wheelchair Mobility: No    Exercises Total Joint Exercises Ankle Circles/Pumps: AROM;Both;10 reps;Supine;Seated   PT Diagnosis:    PT Problem List:   PT Treatment Interventions:     PT Goals (current goals can now be found in the care plan section) Acute Rehab PT Goals Patient Stated Goal: go home tuesday PT Goal Formulation: With patient Time For Goal Achievement: 05/19/13 Potential to Achieve Goals: Good  Visit Information  Last PT Received On: 05/13/13 Assistance Needed: +1 History of Present Illness: 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; 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     Balance  Balance Balance Assessed: Yes Static Standing Balance Static Standing - Balance Support: Bilateral upper extremity supported;Right upper extremity supported;During functional activity;Left upper extremity supported Static Standing - Level of Assistance: 5: Stand by assistance Static Standing - Comment/# of Minutes: tolerated standing at toilet ~2 min with stand by (A) for safety   End of Session PT - End of Session Equipment Utilized During Treatment: Gait belt;Left knee immobilizer Activity Tolerance: Patient  tolerated treatment well Patient left: in  chair;with call bell/phone within reach Nurse Communication: Mobility status   GP     [name], [place] [phone] 05/13/2013, 2:09 PM

## 2013-05-13 NOTE — Progress Notes (Signed)
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 OT provided education. Pt performed toileting as well as grooming at sink. Pt ambulated in hallway and stopped to take rest breaks due to fatigue. OT educated for pt to breathe in through nose and out through mouth.    Follow Up Recommendations  Home health OT;Supervision/Assistance - 24 hour    Barriers to Discharge       Equipment Recommendations  None recommended by OT    Recommendations for Other Services    Frequency Min 2X/week   Progress towards OT Goals Progress towards OT goals: Progressing toward goals  Plan Discharge plan remains appropriate    Precautions / Restrictions Precautions Precautions: Knee;Fall Precaution Comments: Explained to have knee immobilizer on at all times. Required Braces or Orthoses: Knee Immobilizer - Left Knee Immobilizer - Left: On at all times Restrictions Weight Bearing Restrictions: Yes LLE Weight Bearing: Weight bearing as tolerated   Pertinent Vitals/Pain Pain, but not rated. Repositioned.     ADL  Grooming: Wash/dry hands;Wash/dry face;Min guard;Supervision/safety;Set up Where Assessed - Grooming: Supported standing Lower Body Dressing: Set up;Supervision/safety Where Assessed - Lower Body Dressing: Supported sitting (back not supported-leaning on armrests) Toilet Transfer: Min Pension scheme manager Method: Sit to stand;Other (comment) (also stood to urinate at toilet; sit to stand from [facility]) Acupuncturist: Bedside commode;Comfort height toilet Toileting - Clothing Manipulation and Hygiene: Min guard Where Assessed - Glass blower/designer Manipulation and Hygiene:  Standing Equipment Used: Gait belt;Knee Immobilizer;Rolling walker;Sock aid;Reacher;Long-handled sponge Transfers/Ambulation Related to ADLs: Min guard ADL Comments: Pt performed toileting and also grooming at sink. Pt practiced with reacher and sockaid was too small for his foot-Supervision/Setup level to doff sock with cues for technique. Pt able to don/doff right sock and used reacher to doff. OT educated and showed pt long handled sponge and explained use for LB bathing. Educated on dressing technique and to stand in front of bed/chair with walker in front when pulling up LB clothing for safety. Educated on safe shoes and use of bag on walker.     OT Diagnosis:    OT Problem List:   OT Treatment Interventions:     OT Goals(current goals can now be found in the care plan section) Acute Rehab OT Goals Patient Stated Goal: learn how to walk and be the best I can be and to respect therapy OT Goal Formulation: With patient Time For Goal Achievement: 05/19/13 Potential to Achieve Goals: Good ADL Goals Pt Will Perform Lower Body Bathing: with supervision;with adaptive equipment;sit to/from stand Pt Will Perform Lower Body Dressing: with supervision;with adaptive equipment;sit to/from stand Pt Will Transfer to Toilet: ambulating;with modified independence (3 in 1 over commode) Pt Will Perform Toileting - Clothing Manipulation and hygiene: sit to/from stand;with modified independence Pt Will Perform Tub/Shower Transfer: Tub transfer;with supervision;ambulating;3 in 1;rolling walker  Visit Information  Last OT Received On: 05/13/13 Assistance Needed: +1 History of Present Illness: 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     Mobility  Bed Mobility Bed Mobility: Not assessed Transfers Transfers: Sit to Stand;Stand to Sit Sit to Stand: 4: Min guard;With upper extremity assist;From chair/3-in-1;With armrests Stand to Sit: 4: Min guard;With upper extremity assist;To chair/3-in-1;With armrests Details for Transfer Assistance: cues to control descent to chair and to back to [facility] until leg touches    Exercises      Balance     End of Session OT - End of Session Equipment Utilized During Treatment: Gait belt;Rolling walker;Left knee immobilizer Activity Tolerance: Patient tolerated treatment well Patient left: in chair;with call bell/phone within reach  [employer] OTR/L [phone] 05/13/2013, 6:14 PM

## 2013-05-13 NOTE — Progress Notes (Signed)
Peripherally Inserted Central Catheter/Midline Placement  The IV Nurse has discussed with the patient and/or persons authorized to consent for the patient, the purpose of this procedure and the potential benefits and risks involved with this procedure.  The benefits include less needle sticks, lab draws from the catheter and patient may be discharged home with the catheter.  Risks include, but not limited to, infection, bleeding, blood clot (thrombus formation), and puncture of an artery; nerve damage and irregular heat beat.  Alternatives to this procedure were also discussed.  PICC/Midline Placement Documentation  PICC / Midline Single Lumen 05/13/13 PICC Right Basilic (Active)  Indication for Insertion or Continuance of Line Home intravenous therapies (PICC only) 05/13/2013 11:10 AM  Length mark (cm) 2 cm 05/13/2013 11:10 AM  Site Assessment Clean;Dry;Intact 05/13/2013 11:10 AM  Line Status Flushed;Saline locked;Blood return noted 05/13/2013 11:10 AM  Dressing Type Transparent 05/13/2013 11:10 AM  Dressing Status Clean;Dry;Intact;Antimicrobial disc in place 05/13/2013 11:10 AM  Dressing Intervention New dressing 05/13/2013 11:10 AM  Dressing Change Due 05/20/13 05/13/2013 11:10 AM       Ethelda Chick 05/13/2013, 11:16 AM

## 2013-05-13 NOTE — Progress Notes (Signed)
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 Much improved, sitting in chair this am Tol PO, but reports no flatus  Objective: Vital signs in last 24 hours: Temp:  [98.4 F (36.9 C)-98.9 F (37.2 C)] 98.4 F (36.9 C) (09/14 0617) Pulse Rate:  [88-100] 88 (09/14 0617) Resp:  [16-20] 20 (09/14 0749) BP: (126-149)/(60-72) 132/72 mmHg (09/14 0617) SpO2:  [99 %-100 %] 100 % (09/14 0749)  Intake/Output from previous day: 09/13 0701 - 09/14 0700 In: 480 [P.O.:480] Out: 1055 [Urine:1025; Drains:30] Intake/Output this shift:     Recent Labs  05/10/13 2300 05/12/13 0408 05/13/13 0500  HGB 9.3* 8.5* 7.7*    Recent Labs  05/12/13 0408 05/13/13 0500  WBC 9.7 9.8  RBC 2.93* 2.62*  HCT 25.9* 23.1*  PLT 451* 407*    Recent Labs  05/10/13 2031  NA 134*  K 4.3  CL 100  CO2 23  BUN 20  CREATININE 1.13  GLUCOSE 100*  CALCIUM 9.4    Recent Labs  05/10/13 2031  INR 1.16    Sensation intact distally Intact pulses distally Dorsiflexion/Plantar flexion intact dressing clean/dry externally ABD soft, NT  Assessment/Plan: Continue PT today Small drain pulled today, large drain waiting until Monday Will check BMP (Na) Continue D/C planning    Edwin Barthelemy A. 05/13/2013, 7:57 AM

## 2013-05-14 ENCOUNTER — Encounter (HOSPITAL_COMMUNITY): Payer: Self-pay | Admitting: Infectious Diseases

## 2013-05-14 DIAGNOSIS — Z96659 Presence of unspecified artificial knee joint: Secondary | ICD-10-CM

## 2013-05-14 DIAGNOSIS — E119 Type 2 diabetes mellitus without complications: Secondary | ICD-10-CM

## 2013-05-14 DIAGNOSIS — K5900 Constipation, unspecified: Secondary | ICD-10-CM

## 2013-05-14 DIAGNOSIS — R011 Cardiac murmur, unspecified: Secondary | ICD-10-CM

## 2013-05-14 DIAGNOSIS — T8450XA Infection and inflammatory reaction due to unspecified internal joint prosthesis, initial encounter: Principal | ICD-10-CM

## 2013-05-14 LAB — BASIC METABOLIC PANEL
BUN: 15 mg/dL (ref 6–23)
CO2: 24 mEq/L (ref 19–32)
Calcium: 9 mg/dL (ref 8.4–10.5)
Chloride: 101 mEq/L (ref 96–112)
Creatinine, Ser: 1.1 mg/dL (ref 0.50–1.35)
GFR calc Af Amer: 79 mL/min — ABNORMAL LOW (ref >90)
GFR calc non Af Amer: 69 mL/min — ABNORMAL LOW (ref >90)
Glucose, Bld: 149 mg/dL — ABNORMAL HIGH (ref 70–99)
Potassium: 4.1 mEq/L (ref 3.5–5.1)
Sodium: 135 mEq/L (ref 135–145)

## 2013-05-14 LAB — CBC
HCT: 26.6 % — ABNORMAL LOW (ref 39.0–52.0)
Hemoglobin: 8.9 g/dL — ABNORMAL LOW (ref 13.0–17.0)
MCH: 29.6 pg (ref 26.0–34.0)
MCHC: 33.5 g/dL (ref 30.0–36.0)
MCV: 88.4 fL (ref 78.0–100.0)
Platelets: 353 10*3/uL (ref 150–400)
RBC: 3.01 MIL/uL — ABNORMAL LOW (ref 4.22–5.81)
RDW: 13.7 % (ref 11.5–15.5)
WBC: 9 10*3/uL (ref 4.0–10.5)

## 2013-05-14 LAB — CULTURE, ROUTINE-ABSCESS

## 2013-05-14 LAB — TYPE AND SCREEN: Unit division: 0

## 2013-05-14 LAB — GLUCOSE, CAPILLARY
Glucose-Capillary: 131 mg/dL — ABNORMAL HIGH (ref 70–99)
Glucose-Capillary: 136 mg/dL — ABNORMAL HIGH (ref 70–99)

## 2013-05-14 LAB — TISSUE CULTURE

## 2013-05-14 MED ORDER — DEXTROSE 5 % IV SOLN
2.0000 g | Freq: Two times a day (BID) | INTRAVENOUS | Status: DC
  Administered 2013-05-14 – 2013-05-16 (×4): 2 g via INTRAVENOUS
  Filled 2013-05-14 (×6): qty 2

## 2013-05-14 MED ORDER — RIFAMPIN 300 MG PO CAPS
300.0000 mg | ORAL_CAPSULE | Freq: Every day | ORAL | Status: DC
  Administered 2013-05-14 – 2013-05-16 (×3): 300 mg via ORAL
  Filled 2013-05-14 (×3): qty 1
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
## 2013-05-14 NOTE — Progress Notes (Signed)
Physical Therapy Treatment Patient Details Name: Edwin Shaffer MRN: [id number] DOB: 10-11-1945 Today's Date: 05/14/2013 Time: 1308-6578 PT Time Calculation (min): 18 min  PT Assessment / Plan / Recommendation  History of Present Illness 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; 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    PT Comments   Pt able to increase amb distance. Cont to c/o Lt calf pain; 4/10. Requires min guard with turns for safety and RW management. Pt declined step amb this afternoon. Patient needs to practice stairs next session. Pt planned to D/C home tomorrow if medically stable. Will have wife 24/7 for (A) for first 2 weeks. Cont per POC.   Follow Up Recommendations  Home health PT;Supervision/Assistance - 24 hour     Does the patient have the potential to tolerate intense rehabilitation     Barriers to Discharge        Equipment Recommendations  None recommended by PT    Recommendations for Other Services    Frequency 7X/week   Progress towards PT Goals Progress towards PT goals: Progressing toward goals  Plan Current plan remains appropriate    Precautions / Restrictions Precautions Precautions: Knee;Fall Required Braces or Orthoses: Knee Immobilizer - Left Knee Immobilizer - Left: On at all times Restrictions Weight Bearing Restrictions: Yes LLE Weight Bearing: Weight bearing as tolerated   Pertinent Vitals/Pain 4/10; patient repositioned for comfort in chair    Mobility  Bed Mobility Bed Mobility: Not assessed Transfers Transfers: Sit to Stand;Stand to Sit Sit to Stand: 5: Supervision;From chair/3-in-1;With armrests Stand to Sit: 5: Supervision;To chair/3-in-1;With armrests Details for Transfer Assistance: cues for hand placment and sequencing; pt flopped into chair; given cues to control descent into chair with UEs and Rt LE  Ambulation/Gait Ambulation/Gait Assistance: 5: Supervision;4: Min guard Ambulation Distance (Feet): 175 Feet Assistive device: Rolling  walker Ambulation/Gait Assistance Details: cues for gt sequencing and upright posture; pt required min guard (A) with turns; tends to lose balance and have difficulty managing RW with turns; cues to stay inside RW with turns for safety  Gait Pattern: Step-to pattern;Wide base of support Gait velocity: decreased Stairs: No (plan to assess next visit) Wheelchair Mobility Wheelchair Mobility: No    Exercises Total Joint Exercises Ankle Circles/Pumps: AROM;Both;10 reps;Supine;Seated Other Exercises Other Exercises: pt performed calf stretch x10 bil in standing    PT Diagnosis:    PT Problem List:   PT Treatment Interventions:     PT Goals (current goals can now be found in the care plan section) Acute Rehab PT Goals Patient Stated Goal: home now  PT Goal Formulation: With patient Time For Goal Achievement: 05/19/13 Potential to Achieve Goals: Good  Visit Information  Last PT Received On: 05/14/13 Assistance Needed: +1 History of Present Illness: 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; 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     Subjective Data  Subjective: Pt sitting in chair with wife present; " i just wish i could go home now. If im not going home now, i dont want to do steps. Im not going to have a problem with steps."  Patient Stated Goal: home now    Cognition  Cognition Arousal/Alertness: Awake/alert Behavior During Therapy: WFL for tasks assessed/performed Overall Cognitive Status: Impaired/Different from baseline Area of Impairment: Problem solving Problem Solving: Difficulty sequencing;Decreased initiation;Requires verbal cues;Requires tactile cues General Comments: continues to have minor difficulties processiong; wife was present and stated this was not new behavior     Balance  Balance Balance Assessed: No Static Standing  Balance Static Standing - Balance Support: Bilateral upper extremity supported;During functional activity Static Standing - Level of Assistance: 5: Stand by  assistance Static Standing - Comment/# of Minutes: pt stood to use bathroom and wash hands at sink with stand by (A)   End of Session PT - End of Session Equipment Utilized During Treatment: Gait belt;Left knee immobilizer Activity Tolerance: Patient tolerated treatment well Patient left: in chair;with call bell/phone within reach;with family/visitor present Nurse Communication: Mobility status   GP     [name], [place] [phone] 05/14/2013, 3:31 PM

## 2013-05-14 NOTE — Progress Notes (Addendum)
ANTIBIOTIC CONSULT NOTE - Follow up  Pharmacy Consult for Vancomycin Indication: wound infection of his left total knee that was placed a 04/23/2013    No Known Allergies  Patient Measurements: Height: 6' 2.5" (189.2 cm) Weight: 278 lb (126.1 kg) IBW/kg (Calculated) : 83.35   Vital Signs: Temp: 98.2 F (36.8 C) (09/15 0600) Temp src: Oral (09/15 0600) BP: 131/64 mmHg (09/15 0600) Pulse Rate: 84 (09/15 0600) Intake/Output from previous day: 09/14 0701 - 09/15 0700 In: 7761.3 [P.O.:480; I.V.:6071.3; Blood:700; IV Piggyback:500] Out: 1150 [Urine:1150] Intake/Output from this shift: Total I/O In: 240 [P.O.:240] Out: -   Labs:  Recent Labs  05/12/13 0408 05/13/13 0500 05/13/13 0900 05/14/13 0500  WBC 9.7 9.8  --  9.0  HGB 8.5* 7.7*  --  8.9*  PLT 451* 407*  --  353  CREATININE  --   --  1.03 1.10   Estimated Creatinine Clearance: 95.2 ml/min (by C-G formula based on Cr of 1.1). No results found for this basename: VANCOTROUGH, Edwin Shaffer, VANCORANDOM, GENTTROUGH, GENTPEAK, GENTRANDOM, TOBRATROUGH, TOBRAPEAK, TOBRARND, AMIKACINPEAK, AMIKACINTROU, AMIKACIN,  in the last 72 hours   Microbiology: Recent Results (from the past 720 hour(s))  SURGICAL PCR SCREEN     Status: None   Collection Time    04/17/13 10:16 AM      Result Value Range Status   MRSA, PCR NEGATIVE  NEGATIVE Final   Staphylococcus aureus NEGATIVE  NEGATIVE Final   Comment:            The Xpert SA Assay (FDA     approved for NASAL specimens     in patients over 55 years of age),     is one component of     a comprehensive surveillance     program.  Test performance has     been validated by [employer] for patients greater     than or equal to 44 year old.     It is not intended     to diagnose infection nor to     guide or monitor treatment.  ANAEROBIC CULTURE     Status: None   Collection Time    04/23/13 12:33 PM      Result Value Range Status   Specimen Description SYNOVIAL FLUID LEFT    Final   Special Requests FLUID ON SWAB   Final   Gram Stain     Final   Value: FEW WBC PRESENT,BOTH PMN AND MONONUCLEAR     NO ORGANISMS SEEN     Performed at [facility]   Culture     Final   Value: NO ANAEROBES ISOLATED     Performed at [facility]   Report Status 04/28/2013 FINAL   Final  BODY FLUID CULTURE     Status: None   Collection Time    04/23/13 12:33 PM      Result Value Range Status   Specimen Description SYNOVIAL FLUID LEFT   Final   Special Requests FLUID ON SWAB   Final   Gram Stain     Final   Value: FEW WBC PRESENT,BOTH PMN AND MONONUCLEAR     NO ORGANISMS SEEN     Performed at [facility]   Culture     Final   Value: NO GROWTH 4 DAYS     Performed at [facility]   Report Status 04/27/2013 FINAL   Final  ANAEROBIC CULTURE     Status: None  Collection Time    05/11/13  1:44 PM      Result Value Range Status   Specimen Description TISSUE KNEE LEFT   Final   Special Requests LEFT KNEE DEEP TISSUE RECEIVED IN CUP   Final   Gram Stain     Final   Value: NO WBC SEEN     NO ORGANISMS SEEN     Performed at [facility]   Culture     Final   Value: NO ANAEROBES ISOLATED; CULTURE IN PROGRESS FOR 5 DAYS     Performed at [facility]   Report Status PENDING   Incomplete  TISSUE CULTURE     Status: None   Collection Time    05/11/13  1:44 PM      Result Value Range Status   Specimen Description TISSUE KNEE LEFT   Final   Special Requests LEFT KNEE DEEP RECEIVED IN CUP   Final   Gram Stain     Final   Value: NO WBC SEEN     NO ORGANISMS SEEN     Performed at [facility]   Culture     Final   Value: NO GROWTH 3 DAYS     Performed at [facility]   Report Status 05/14/2013 FINAL   Final  ANAEROBIC CULTURE     Status: None   Collection Time    05/11/13  1:48 PM      Result Value Range Status   Specimen Description ABSCESS KNEE LEFT   Final   Special Requests LEFT KNEE DEEP RECEIVED SWAB    Final   Gram Stain     Final   Value: NO WBC SEEN     NO SQUAMOUS EPITHELIAL CELLS SEEN     NO ORGANISMS SEEN     Performed at [facility]   Culture     Final   Value: NO ANAEROBES ISOLATED; CULTURE IN PROGRESS FOR 5 DAYS     Performed at [facility]   Report Status PENDING   Incomplete  CULTURE, ROUTINE-ABSCESS     Status: None   Collection Time    05/11/13  1:48 PM      Result Value Range Status   Specimen Description ABSCESS KNEE LEFT   Final   Special Requests LEFT KNEE DEEP RECEIVED SWAB   Final   Gram Stain     Final   Value: NO WBC SEEN     NO SQUAMOUS EPITHELIAL CELLS SEEN     NO ORGANISMS SEEN     Performed at [facility]   Culture     Final   Value: NO GROWTH 3 DAYS     Performed at [facility]   Report Status 05/14/2013 FINAL   Final    Medical History: Past Medical History  Diagnosis Date  . Hypertension   . Diabetes mellitus without complication   . Cancer     vocal cord  . Arthritis     Medications:  Scheduled:  . aspirin EC  325 mg Oral Q breakfast  . Dapagliflozin Propanediol  5 mg Oral Daily  . diltiazem  300 mg Oral Daily  . docusate sodium  100 mg Oral BID  . insulin aspart  0-15 Units Subcutaneous TID WC  . lisinopril  20 mg Oral Daily  . sodium chloride  10-40 mL Intracatheter Q12H  . tamsulosin  0.4 mg Oral Daily  . vancomycin  1,250 mg Intravenous Q12H  Assessment: 67 y.o male with infected left total knee s/p L TKA on 04/23/13. S/p I&D of infected TKA with revision of tibial component on 05/11/13. Vancomycin started on 05/11/13. [name], PA-C called me today requesting dose recommendation for dicharge vancomycin dose as plan to continue antibiotics x 6 weeks total. Has PICC line in place.  Afebrile, WBC 9.0,  SCr 1.1, CrCl ~ 95 ml/min  9/12 Tissue Cx  Negative 9/12 Abscess L. Knee negative   Goal of Therapy:  Vancomycin trough level 15-20 mcg/ml  Plan:  Continue Vancomycin 1250 mg IV  q12h. Check vancomycin trough tonight and adjust vancomycin dose for target trough level 15-20 mcg/ml.  Noah Delaine, RPh Clinical Pharmacist Pager: [phone] 05/14/2013,12:10 PM

## 2013-05-14 NOTE — Progress Notes (Signed)
Patient offered choice for home health services PT and IV antibiotics.  Patient had [facility] prior to admission and wants to continue with [place]. He selected Edwin Shaffer for IV antibiotic therapy Elizebeth Koller called with referral for home PT and IV antibiotics

## 2013-05-14 NOTE — Progress Notes (Signed)
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 Is supervision level for mobility and transfers for safety and min cues. Patient needs to practice stairs next session. Pt anticipates D/C home tomorrow. RN notified that pt c/o 5/10 pain in Lt calf.      Follow Up Recommendations  Home health PT;Supervision/Assistance - 24 hour     Does the patient have the potential to tolerate intense rehabilitation     Barriers to Discharge        Equipment Recommendations  None recommended by PT    Recommendations for Other Services    Frequency 7X/week   Progress towards PT Goals Progress towards PT goals: Progressing toward goals  Plan Current plan remains appropriate    Precautions / Restrictions Precautions Precautions: Knee;Fall Required Braces or Orthoses: Knee Immobilizer - Left Knee Immobilizer - Left: On at all times Restrictions Weight Bearing Restrictions: Yes LLE Weight Bearing: Weight bearing as tolerated   Pertinent Vitals/Pain 5/10 in Lt calf; no warmness noted. patient repositioned for comfort     Mobility  Bed Mobility Bed Mobility: Not assessed Transfers Transfers: Sit to Stand;Stand to Sit;Stand Pivot Transfers Sit to Stand: 5: Supervision;From chair/3-in-1;With armrests Stand to Sit: 5: Supervision;To chair/3-in-1;With armrests Details for Transfer Assistance: supervision for safety and min cues for hand placement and to control descent to chair and not flop  Ambulation/Gait Ambulation/Gait Assistance: 5: Supervision Ambulation Distance (Feet): 100 Feet Assistive device: Rolling walker Ambulation/Gait Assistance Details: limited amb this session due to Lt calf pain;  pt c/o 5/10 pain in calf with amb; supervision for safety and cues for upright posture and gt sequencing  Gait Pattern: Step-to pattern;Wide base of support Gait velocity: decreased Stairs: No Wheelchair Mobility Wheelchair Mobility: No    Exercises Total Joint Exercises Ankle Circles/Pumps: AROM;Both;10 reps;Supine;Seated (performed PROM Lt ankle; pt reported pain- RN notifed)   PT Diagnosis:    PT Problem List:   PT Treatment Interventions:     PT Goals (current goals can now be found in the care plan section) Acute Rehab PT Goals Patient Stated Goal: to go home soon and walk like i was after [facility] PT Goal Formulation: With patient Time For Goal Achievement: 05/19/13 Potential to Achieve Goals: Good  Visit Information  Last PT Received On: 05/14/13 Assistance Needed: +1 History of Present Illness: 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; 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     Subjective Data  Subjective: Pt sitting in chair;  " glad you are here, come tell my wife what all we've been doing."  Patient Stated Goal: to go home soon and walk like i was after [facility]   Cognition  Cognition Arousal/Alertness: Awake/alert Behavior During Therapy: WFL for tasks assessed/performed Overall Cognitive Status: Impaired/Different from baseline Area of Impairment: Problem solving Problem Solving: Difficulty sequencing;Decreased initiation;Requires verbal cues;Requires tactile cues General Comments: continues to have minor difficulties processiong; wife was present and stated this was not new behavior     Balance  Balance Balance Assessed: Yes Static Standing Balance Static Standing - Balance Support: Bilateral upper extremity supported;During functional activity Static Standing - Level of Assistance: 5: Stand by assistance Static Standing - Comment/# of Minutes: pt stood  to use bathroom and wash hands at sink with stand by (A)   End of Session PT - End of Session Equipment Utilized During  Treatment: Gait belt;Left knee immobilizer Activity Tolerance: Patient tolerated treatment well Patient left: in chair;with call bell/phone within reach;with family/visitor present Nurse Communication: Mobility status;Other (comment) (pt c/o 5/10 Lt calf pain)   GP     Shelva Majestic [place], [place] [phone] 05/14/2013, 1:05 PM

## 2013-05-14 NOTE — Progress Notes (Signed)
PATIENT ID: Edwin Shaffer  MRN: [id number]  DOB/AGE:  Feb 25, 1946 / 67 y.o.  3 Days Post-Op Procedure(s) (LRB): IRRIGATION AND DEBRIDEMENT KNEE WITH POLY EXCHANGE (Left)    PROGRESS NOTE Subjective: Patient is alert, oriented, no Nausea, no Vomiting, yes passing gas, no Bowel Movement. Taking PO well. Denies SOB, Chest or Calf Pain. Using Incentive Spirometer, PAS in place. Ambulate WBAT with knee immobilizer. Patient reports pain as mild  .    Objective: Vital signs in last 24 hours: Filed Vitals:   05/14/13 0130 05/14/13 0220 05/14/13 0400 05/14/13 0600  BP: 128/67 132/80  131/64  Pulse: 82 86  84  Temp: 98.7 F (37.1 C) 98.7 F (37.1 C)  98.2 F (36.8 C)  TempSrc: Oral   Oral  Resp: 18 20 20 18   Height:      Weight:      SpO2:  99% 99% 98%      Intake/Output from previous day: I/O last 3 completed shifts: In: 8121.3 [P.O.:840; I.V.:6071.3; Blood:700; Other:10; IV Piggyback:500] Out: 1850 [Urine:1850]   Intake/Output this shift:     LABORATORY DATA:  Recent Labs  05/13/13 0500  05/13/13 0900  05/13/13 1611 05/13/13 2254 05/14/13 0500 05/14/13 0643  WBC 9.8  --   --   --   --   --  9.0  --   HGB 7.7*  --   --   --   --   --  8.9*  --   HCT 23.1*  --   --   --   --   --  26.6*  --   PLT 407*  --   --   --   --   --  353  --   NA  --   --  132*  --   --   --  135  --   K  --   --  4.4  --   --   --  4.1  --   CL  --   --  101  --   --   --  101  --   CO2  --   --  22  --   --   --  24  --   BUN  --   --  15  --   --   --  15  --   CREATININE  --   --  1.03  --   --   --  1.10  --   GLUCOSE  --   --  180*  --   --   --  149*  --   GLUCAP  --   < >  --   < > 134* 131*  --  136*  CALCIUM  --   < > 8.8  --   --   --  9.0  --   < > = values in this interval not displayed.  Examination: Neurologically intact Neurovascular intact Sensation intact distally Intact pulses distally Dorsiflexion/Plantar flexion intact Incision: scant drainage}  Assessment:   3  Days Post-Op Procedure(s) (LRB): IRRIGATION AND DEBRIDEMENT KNEE WITH POLY EXCHANGE (Left) ADDITIONAL DIAGNOSIS:  Acute Blood Loss Anemia and Diabetes Pt given 2 units last night and his blood counts have improved.  No longer hyponatremic. Blood and plasma separated in drain indicating minimal recent drainage, drain pulled without difficulty.  Plan: PT/OT WBAT, with knee immobilizer. DVT Prophylaxis:  SCDx72hrs, ASA 325 mg BID x 2 weeks DISCHARGE PLAN: Home DISCHARGE  NEEDS: IV Antibiotics for 6 weeks, pt has picc line in place.     Loralyn Rachel R 05/14/2013, 7:28 AM

## 2013-05-15 ENCOUNTER — Encounter (HOSPITAL_COMMUNITY): Payer: Self-pay | Admitting: Orthopedic Surgery

## 2013-05-15 DIAGNOSIS — I359 Nonrheumatic aortic valve disorder, unspecified: Secondary | ICD-10-CM

## 2013-05-15 LAB — COMPREHENSIVE METABOLIC PANEL
ALT: 13 U/L (ref 0–53)
AST: 18 U/L (ref 0–37)
CO2: 24 mEq/L (ref 19–32)
Chloride: 98 mEq/L (ref 96–112)
Creatinine, Ser: 1.07 mg/dL (ref 0.50–1.35)
GFR calc non Af Amer: 71 mL/min — ABNORMAL LOW (ref >90)
Sodium: 132 mEq/L — ABNORMAL LOW (ref 135–145)
Total Bilirubin: 0.7 mg/dL (ref 0.3–1.2)

## 2013-05-15 LAB — GLUCOSE, CAPILLARY: Glucose-Capillary: 148 mg/dL — ABNORMAL HIGH (ref 70–99)

## 2013-05-15 MED ORDER — FLEET ENEMA 7-19 GM/118ML RE ENEM: 1.0000 | ENEMA | Freq: Two times a day (BID) | RECTAL | Status: DC | PRN

## 2013-05-15 MED ORDER — VANCOMYCIN HCL 10 G IV SOLR
1500.0000 mg | Freq: Two times a day (BID) | INTRAVENOUS | Status: DC
  Administered 2013-05-15 – 2013-05-16 (×2): 1500 mg via INTRAVENOUS
  Filled 2013-05-15 (×3): qty 1500

## 2013-05-15 MED ORDER — VANCOMYCIN HCL 500 MG IV SOLR
500.0000 mg | INTRAVENOUS | Status: AC
  Administered 2013-05-15: 500 mg via INTRAVENOUS
  Filled 2013-05-15: qty 500

## 2013-05-15 MED ORDER — FLEET ENEMA 7-19 GM/118ML RE ENEM
1.0000 | ENEMA | Freq: Once | RECTAL | Status: AC
  Administered 2013-05-15: 1 via RECTAL

## 2013-05-15 NOTE — Progress Notes (Signed)
OT Cancellation Note  Patient Details Name: CHARLS CUSTER MRN: [id number] DOB: [birth year]   Cancelled Treatment:    Reason Eval/Treat Not Completed: Medical issues which prohibited therapy, pt. Was given an enema prior to arrival and was on bsc, states he is in great pain from his bowel issues and had just gotten on bsc and requests to be left to "focus on that right now".   Robet Leu, COTA/L 05/15/2013, 8:50 AM

## 2013-05-15 NOTE — Progress Notes (Signed)
PT Cancellation Note  Patient Details Name: Edwin Shaffer MRN: [id number] DOB: [birth year]   Cancelled Treatment:    Reason Eval/Treat Not Completed: Patient at procedure or test/unavailable.  Attempted to see pt x 2 this PM.  First attempt with refused due to fatigue and second attempt pt in procedure.   Marcin Holte 05/15/2013, 4:58 PM   Jake Shark, PT DPT [phone]
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
## 2013-05-15 NOTE — Progress Notes (Signed)
Physical Therapy Treatment Patient Details Name: Edwin Shaffer MRN: [id number] DOB: 15-Jun-1946 Today's Date: 05/15/2013 Time: 5284-1324 PT Time Calculation (min): 15 min  PT Assessment / Plan / Recommendation  History of Present Illness 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; 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    PT Comments   Limited session due to loose stool this AM.  Pt request to stay close to room just in case.  Will attempt to do more this afternoon.    Follow Up Recommendations  Home health PT;Supervision/Assistance - 24 hour     Equipment Recommendations  None recommended by PT    Frequency 7X/week   Progress towards PT Goals Progress towards PT goals: Progressing toward goals  Plan Current plan remains appropriate    Precautions / Restrictions Precautions Precautions: Knee;Fall Precaution Comments: NO ROM Required Braces or Orthoses: Knee Immobilizer - Left Knee Immobilizer - Left: On at all times Restrictions Weight Bearing Restrictions: Yes LLE Weight Bearing: Weight bearing as tolerated   Pertinent Vitals/Pain 4/10 left knee    Mobility  Bed Mobility Bed Mobility: Sit to Supine Sit to Supine: 4: Min assist;HOB flat Details for Bed Mobility Assistance: (A) to left LE into bed with cues for technique Transfers Transfers: Sit to Stand;Stand to Sit Sit to Stand: 5: Supervision;From chair/3-in-1;With armrests Stand to Sit: 5: Supervision;To chair/3-in-1;With armrests Details for Transfer Assistance: Supervision for safety with cues for hand placement Ambulation/Gait Ambulation/Gait Assistance: 5: Supervision;4: Min guard Ambulation Distance (Feet): 40 Feet Assistive device: Rolling walker Ambulation/Gait Assistance Details: Minguard for safety with cues for body position within RW.  Limited ambulation due to loose stool. Gait Pattern: Step-to pattern;Wide base of support Gait velocity: decreased Stairs: No Wheelchair Mobility Wheelchair Mobility: No    Exercises      PT Diagnosis:    PT Problem List:   PT Treatment Interventions:     PT Goals (current goals can now be found in the care plan section) Acute Rehab PT Goals Patient Stated Goal: To go home as soon as possible PT Goal Formulation: With patient Time For Goal Achievement: 05/19/13 Potential to Achieve Goals: Good  Visit Information  Last PT Received On: 05/15/13 Assistance Needed: +1 History of Present Illness: 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; 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     Subjective Data  Subjective: "I can't stop going to bathroom." Patient Stated Goal: To go home as soon as possible   Cognition  Cognition Arousal/Alertness: Awake/alert Behavior During Therapy: WFL for tasks assessed/performed Overall Cognitive Status: Within Functional Limits for tasks assessed    Balance     End of Session PT - End of Session Equipment Utilized During Treatment: Gait belt;Left knee immobilizer Activity Tolerance: Patient tolerated treatment well Patient left: with family/visitor present;in bed Nurse Communication: Mobility status   GP     Addison Freimuth 05/15/2013, 11:43 AM  Jake Shark, PT DPT [phone]

## 2013-05-15 NOTE — Progress Notes (Addendum)
Patient ID: Edwin Shaffer, male   DOB: 04/11/1946, 67 y.o.   MRN: [id number] PATIENT ID: Edwin Shaffer  MRN: [id number]  DOB/AGE:  [birth year] / 67 y.o.  4 Days Post-Op Procedure(s) (LRB): IRRIGATION AND DEBRIDEMENT KNEE WITH POLY EXCHANGE (Left)    PROGRESS NOTE Subjective: Patient is alert, oriented, no Nausea, no Vomiting, yes passing gas, patient has not had a bowel movement 7 days would like a fleets enema today.  Denies SOB, Chest or Calf Pain. Using Incentive Spirometer, PAS in place. Ambulate weight bearing as tolerated with a knee immobilizer in place at all times, Patient reports pain as 1 on 0-10 scale  .    Objective: Vital signs in last 24 hours: Filed Vitals:   05/14/13 0600 05/14/13 1334 05/14/13 2142 05/15/13 0531  BP: 131/64 142/77 144/71 134/65  Pulse: 84 93 92 90  Temp: 98.2 F (36.8 C) 98.8 F (37.1 C) 98.7 F (37.1 C) 98.5 F (36.9 C)  TempSrc: Oral  Oral Oral  Resp: 18 20 18 18   Height:      Weight:      SpO2: 98% 100% 98% 96%      Intake/Output from previous day: I/O last 3 completed shifts: In: 7871.3 [P.O.:840; I.V.:6071.3; Blood:700; Other:10; IV Piggyback:250] Out: 500 [Urine:500]   Intake/Output this shift:     LABORATORY DATA:  Recent Labs  05/13/13 0500  05/14/13 0500  05/14/13 1609 05/14/13 2135 05/15/13 0500 05/15/13 0642  WBC 9.8  --  9.0  --   --   --   --   --   HGB 7.7*  --  8.9*  --   --   --   --   --   HCT 23.1*  --  26.6*  --   --   --   --   --   PLT 407*  --  353  --   --   --   --   --   NA  --   < > 135  --   --   --  132*  --   K  --   < > 4.1  --   --   --  4.1  --   CL  --   < > 101  --   --   --  98  --   CO2  --   < > 24  --   --   --  24  --   BUN  --   < > 15  --   --   --  15  --   CREATININE  --   < > 1.10  --   --   --  1.07  --   GLUCOSE  --   < > 149*  --   --   --  129*  --   GLUCAP  --   < >  --   < > 122* 151*  --  136*  CALCIUM  --   < > 9.0  --   --   --  8.9  --   < > = values in this interval  not displayed.  Final results on the fluid that I aspirated from his knee on 05/10/2013,When the patient was not on antibiotics are no growth on aerobic and anaerobic cultures.  A specimen was obtained prior to admission.  And was also done at [facility].  Gram stain did show moderate white cells and polys and  monos.  Examination: Neurologically intact ABD soft Neurovascular intact Sensation intact distally Intact pulses distally Dorsiflexion/Plantar flexion intact Incision: There was bloody drainage at the inferior aspect of the wound that covered one half of a 4 x 4 over 24 hours No cellulitis present Compartment soft} I perform dressing change in the room today and was able to get 3-4 cc of bloody fluid out of the anterior aspect of the wound. This will need to be washed for another 24 hours. Assessment:   4 Days Post-Op Procedure(s) (LRB): IRRIGATION AND DEBRIDEMENT KNEE WITH POLY EXCHANGE (Left) ADDITIONAL DIAGNOSIS:  Type 2 diabetes, hypertension, acute blood loss anemia  Plan: PT/OT WBAT, patient will be in the immobilizer for 3-4 weeks to hold and he still and minimize the chance of reaccumulation of fluid.  DVT Prophylaxis:  SCDx72hrs, ASA 325 mg BID x 2 weeks DISCHARGE PLAN: Home, will loss the patient for another 24 hours and hopefully the wound will become dry. If not he may need a another irrigation debridement with removal of the implants. DISCHARGE NEEDS: HHPT, HHRN, CPM, Walker, 3-in-1 comode seat and IV Antibiotics, antibiotics at this time consist of vancomycin, rifampin, and cefepime, these will be continued at home for 6 weeks.     Brent Taillon J 05/15/2013, 7:32 AM

## 2013-05-15 NOTE — Progress Notes (Signed)
  Echocardiogram 2D Echocardiogram has been performed.  Arvil Chaco 05/15/2013, 5:05 PM

## 2013-05-15 NOTE — Progress Notes (Signed)
INFECTIOUS DISEASE PROGRESS NOTE  ID: Edwin Shaffer is a 67 y.o. male with  Principal Problem:   Infection of total left knee replacement  Subjective: Without complaints  Abtx:  Anti-infectives   Start     Dose/Rate Route Frequency Ordered Stop   05/15/13 2200  vancomycin (VANCOCIN) 1,500 mg in sodium chloride 0.9 % 500 mL IVPB     1,500 mg 250 mL/hr over 120 Minutes Intravenous Every 12 hours 05/15/13 1341     05/15/13 1415  vancomycin (VANCOCIN) 500 mg in sodium chloride 0.9 % 100 mL IVPB     500 mg 100 mL/hr over 60 Minutes Intravenous NOW 05/15/13 1341 05/15/13 1454   05/14/13 1700  ceFEPIme (MAXIPIME) 2 g in dextrose 5 % 50 mL IVPB     2 g 100 mL/hr over 30 Minutes Intravenous Every 12 hours 05/14/13 1619     05/14/13 1700  rifampin (RIFADIN) capsule 300 mg     300 mg Oral Daily 05/14/13 1619     05/11/13 1000  vancomycin (VANCOCIN) 1,250 mg in sodium chloride 0.9 % 250 mL IVPB  Status:  Discontinued     1,250 mg 166.7 mL/hr over 90 Minutes Intravenous Every 12 hours 05/10/13 2152 05/15/13 1341   05/10/13 1930  vancomycin (VANCOCIN) 2,500 mg in sodium chloride 0.9 % 500 mL IVPB     2,500 mg 250 mL/hr over 120 Minutes Intravenous  Once 05/10/13 1847 05/10/13 2351      Medications:  Scheduled: . aspirin EC  325 mg Oral Q breakfast  . ceFEPime (MAXIPIME) IV  2 g Intravenous Q12H  . Dapagliflozin Propanediol  5 mg Oral Daily  . diltiazem  300 mg Oral Daily  . docusate sodium  100 mg Oral BID  . insulin aspart  0-15 Units Subcutaneous TID WC  . lisinopril  20 mg Oral Daily  . rifampin  300 mg Oral Daily  . sodium chloride  10-40 mL Intracatheter Q12H  . tamsulosin  0.4 mg Oral Daily  . vancomycin  1,500 mg Intravenous Q12H    Objective: Vital signs in last 24 hours: Temp:  [98.5 F (36.9 C)-99.8 F (37.7 C)] 99.8 F (37.7 C) (09/16 1256) Pulse Rate:  [90-93] 93 (09/16 1256) Resp:  [13-18] 16 (09/16 1256) BP: (134-168)/(65-89) 168/89 mmHg (09/16  1256) SpO2:  [96 %-99 %] 99 % (09/16 1256)   General appearance: alert, cooperative and no distress Resp: clear to auscultation bilaterally Cardio: regular rate and rhythm GI: normal findings: bowel sounds normal and soft, non-tender Extremities: some breakdown at inferior wound.   Lab Results  Recent Labs  05/13/13 0500  05/14/13 0500 05/15/13 0500  WBC 9.8  --  9.0  --   HGB 7.7*  --  8.9*  --   HCT 23.1*  --  26.6*  --   NA  --   < > 135 132*  K  --   < > 4.1 4.1  CL  --   < > 101 98  CO2  --   < > 24 24  BUN  --   < > 15 15  CREATININE  --   < > 1.10 1.07  < > = values in this interval not displayed. Liver Panel  Recent Labs  05/15/13 0500  PROT 7.1  ALBUMIN 2.2*  AST 18  ALT 13  ALKPHOS 78  BILITOT 0.7   Sedimentation Rate No results found for this basename: ESRSEDRATE,  in the last 72 hours C-Reactive  Protein No results found for this basename: CRP,  in the last 72 hours  Microbiology: Recent Results (from the past 240 hour(s))  ANAEROBIC CULTURE     Status: None   Collection Time    05/11/13  1:44 PM      Result Value Range Status   Specimen Description TISSUE KNEE LEFT   Final   Special Requests LEFT KNEE DEEP TISSUE RECEIVED IN CUP   Final   Gram Stain     Final   Value: NO WBC SEEN     NO ORGANISMS SEEN     Performed at [facility]   Culture     Final   Value: NO ANAEROBES ISOLATED; CULTURE IN PROGRESS FOR 5 DAYS     Performed at [facility]   Report Status PENDING   Incomplete  TISSUE CULTURE     Status: None   Collection Time    05/11/13  1:44 PM      Result Value Range Status   Specimen Description TISSUE KNEE LEFT   Final   Special Requests LEFT KNEE DEEP RECEIVED IN CUP   Final   Gram Stain     Final   Value: NO WBC SEEN     NO ORGANISMS SEEN     Performed at [facility]   Culture     Final   Value: NO GROWTH 3 DAYS     Performed at [facility]   Report Status 05/14/2013 FINAL   Final   ANAEROBIC CULTURE     Status: None   Collection Time    05/11/13  1:48 PM      Result Value Range Status   Specimen Description ABSCESS KNEE LEFT   Final   Special Requests LEFT KNEE DEEP RECEIVED SWAB   Final   Gram Stain     Final   Value: NO WBC SEEN     NO SQUAMOUS EPITHELIAL CELLS SEEN     NO ORGANISMS SEEN     Performed at [facility]   Culture     Final   Value: NO ANAEROBES ISOLATED; CULTURE IN PROGRESS FOR 5 DAYS     Performed at [facility]   Report Status PENDING   Incomplete  CULTURE, ROUTINE-ABSCESS     Status: None   Collection Time    05/11/13  1:48 PM      Result Value Range Status   Specimen Description ABSCESS KNEE LEFT   Final   Special Requests LEFT KNEE DEEP RECEIVED SWAB   Final   Gram Stain     Final   Value: NO WBC SEEN     NO SQUAMOUS EPITHELIAL CELLS SEEN     NO ORGANISMS SEEN     Performed at [facility]   Culture     Final   Value: NO GROWTH 3 DAYS     Performed at [facility]   Report Status 05/14/2013 FINAL   Final    Studies/Results: No results found.   Assessment/Plan: Infected L TKR  Total days of antibiotics: vanco 9-11, cefepime/rifampin 9-15  Would continue his broad coverage Plan for 6 weeks.  Await result of his echo Pt very worried about outcome of his knee          Edwin Shaffer Infectious Diseases (pager) [phone] [email or web address] 05/15/2013, 5:36 PM  LOS: 5 days

## 2013-05-15 NOTE — Progress Notes (Signed)
ANTIBIOTIC CONSULT NOTE - Follow up  Pharmacy Consult for Vancomycin Indication: wound infection of his left total knee that was placed a 04/23/2013    No Known Allergies  Patient Measurements: Height: 6' 2.5" (189.2 cm) Weight: 278 lb (126.1 kg) IBW/kg (Calculated) : 83.35   Vital Signs: Temp: 99.8 F (37.7 C) (09/16 1256) Temp src: Oral (09/16 0531) BP: 168/89 mmHg (09/16 1256) Pulse Rate: 93 (09/16 1256) Intake/Output from previous day: 09/15 0701 - 09/16 0700 In: 840 [P.O.:840] Out: 500 [Urine:500] Intake/Output from this shift: Total I/O In: 360 [P.O.:360] Out: 200 [Urine:200]  Labs:  Recent Labs  05/13/13 0500 05/13/13 0900 05/14/13 0500 05/15/13 0500  WBC 9.8  --  9.0  --   HGB 7.7*  --  8.9*  --   PLT 407*  --  353  --   CREATININE  --  1.03 1.10 1.07   Estimated Creatinine Clearance: 97.8 ml/min (by C-G formula based on Cr of 1.07).  Recent Labs  05/15/13 1000  VANCOTROUGH 13.2     Microbiology: Recent Results (from the past 720 hour(s))  SURGICAL PCR SCREEN     Status: None   Collection Time    04/17/13 10:16 AM      Result Value Range Status   MRSA, PCR NEGATIVE  NEGATIVE Final   Staphylococcus aureus NEGATIVE  NEGATIVE Final   Comment:            The Xpert SA Assay (FDA     approved for NASAL specimens     in patients over 47 years of age),     is one component of     a comprehensive surveillance     program.  Test performance has     been validated by [facility] for patients greater     than or equal to 65 year old.     It is not intended     to diagnose infection nor to     guide or monitor treatment.  ANAEROBIC CULTURE     Status: None   Collection Time    04/23/13 12:33 PM      Result Value Range Status   Specimen Description SYNOVIAL FLUID LEFT   Final   Special Requests FLUID ON SWAB   Final   Gram Stain     Final   Value: FEW WBC PRESENT,BOTH PMN AND MONONUCLEAR     NO ORGANISMS SEEN     Performed at [facility]   Culture     Final   Value: NO ANAEROBES ISOLATED     Performed at [facility]   Report Status 04/28/2013 FINAL   Final  BODY FLUID CULTURE     Status: None   Collection Time    04/23/13 12:33 PM      Result Value Range Status   Specimen Description SYNOVIAL FLUID LEFT   Final   Special Requests FLUID ON SWAB   Final   Gram Stain     Final   Value: FEW WBC PRESENT,BOTH PMN AND MONONUCLEAR     NO ORGANISMS SEEN     Performed at [facility]   Culture     Final   Value: NO GROWTH 4 DAYS     Performed at [facility]   Report Status 04/27/2013 FINAL   Final  ANAEROBIC CULTURE     Status: None   Collection Time    05/11/13  1:44 PM  Result Value Range Status   Specimen Description TISSUE KNEE LEFT   Final   Special Requests LEFT KNEE DEEP TISSUE RECEIVED IN CUP   Final   Gram Stain     Final   Value: NO WBC SEEN     NO ORGANISMS SEEN     Performed at [facility]   Culture     Final   Value: NO ANAEROBES ISOLATED; CULTURE IN PROGRESS FOR 5 DAYS     Performed at [facility]   Report Status PENDING   Incomplete  TISSUE CULTURE     Status: None   Collection Time    05/11/13  1:44 PM      Result Value Range Status   Specimen Description TISSUE KNEE LEFT   Final   Special Requests LEFT KNEE DEEP RECEIVED IN CUP   Final   Gram Stain     Final   Value: NO WBC SEEN     NO ORGANISMS SEEN     Performed at [facility]   Culture     Final   Value: NO GROWTH 3 DAYS     Performed at [facility]   Report Status 05/14/2013 FINAL   Final  ANAEROBIC CULTURE     Status: None   Collection Time    05/11/13  1:48 PM      Result Value Range Status   Specimen Description ABSCESS KNEE LEFT   Final   Special Requests LEFT KNEE DEEP RECEIVED SWAB   Final   Gram Stain     Final   Value: NO WBC SEEN     NO SQUAMOUS EPITHELIAL CELLS SEEN     NO ORGANISMS SEEN     Performed at [facility]   Culture     Final    Value: NO ANAEROBES ISOLATED; CULTURE IN PROGRESS FOR 5 DAYS     Performed at [facility]   Report Status PENDING   Incomplete  CULTURE, ROUTINE-ABSCESS     Status: None   Collection Time    05/11/13  1:48 PM      Result Value Range Status   Specimen Description ABSCESS KNEE LEFT   Final   Special Requests LEFT KNEE DEEP RECEIVED SWAB   Final   Gram Stain     Final   Value: NO WBC SEEN     NO SQUAMOUS EPITHELIAL CELLS SEEN     NO ORGANISMS SEEN     Performed at [facility]   Culture     Final   Value: NO GROWTH 3 DAYS     Performed at [facility]   Report Status 05/14/2013 FINAL   Final    Medical History: Past Medical History  Diagnosis Date  . Hypertension   . Diabetes mellitus without complication   . Cancer     vocal cord  . Arthritis     Medications:  Scheduled:  . aspirin EC  325 mg Oral Q breakfast  . ceFEPime (MAXIPIME) IV  2 g Intravenous Q12H  . Dapagliflozin Propanediol  5 mg Oral Daily  . diltiazem  300 mg Oral Daily  . docusate sodium  100 mg Oral BID  . insulin aspart  0-15 Units Subcutaneous TID WC  . lisinopril  20 mg Oral Daily  . rifampin  300 mg Oral Daily  . sodium chloride  10-40 mL Intracatheter Q12H  . tamsulosin  0.4 mg Oral Daily  . vancomycin  1,500  mg Intravenous Q12H  . 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 S/p I&D of infected TKA with revision of tibial component on 05/11/13. Vancomycin started on 05/11/13.  Tm 99.8 F WBC 9.0 on 05/14/13,  SCr 1.07, CrCl ~ 97 ml/min Vancomycin trough today = 13.2 mcg/ml on vancomycin 1250 mg IV q12h  (all doses charted as given).  Will increase dose to target trough 15-20 mcg/ml for septic arthritis/cellulits L TKA (hardware not removed).   9/12 Tissue Cx  Negative 9/12 Abscess L. Knee negative  Goal of Therapy:  Vancomycin trough level 15-20 mcg/ml  Plan:  Give extra 500 mg IV vancomycin x 1 now then  increase dosage of vancomycin to 1500 mg IV q12h.  If discharged home tomorrow will need vancomycin trough checked at steady state by home health pharmacist and weekly vancomycin troughs.   Noah Delaine, RPh Clinical Pharmacist Pager: [phone] 05/15/2013,1:53 PM

## 2013-05-16 LAB — ANAEROBIC CULTURE
Gram Stain: NONE SEEN
Gram Stain: NONE SEEN

## 2013-05-16 LAB — GLUCOSE, CAPILLARY
Glucose-Capillary: 136 mg/dL — ABNORMAL HIGH (ref 70–99)
Glucose-Capillary: 156 mg/dL — ABNORMAL HIGH (ref 70–99)

## 2013-05-16 MED ORDER — DEXTROSE 5 % IV SOLN: 2.0000 g | Freq: Two times a day (BID) | INTRAVENOUS | Status: DC

## 2013-05-16 MED ORDER — RIFAMPIN 300 MG PO CAPS: 300.0000 mg | ORAL_CAPSULE | Freq: Every day | ORAL | Status: DC

## 2013-05-16 MED ORDER — HEPARIN SOD (PORK) LOCK FLUSH 100 UNIT/ML IV SOLN
250.0000 [IU] | INTRAVENOUS | Status: AC | PRN
  Administered 2013-05-16: 500 [IU]

## 2013-05-16 MED ORDER — VANCOMYCIN HCL 10 G IV SOLR: 1500.0000 mg | Freq: Two times a day (BID) | INTRAVENOUS | Status: DC

## 2013-05-16 NOTE — Progress Notes (Addendum)
Physical Therapy Treatment Patient Details Name: Edwin Shaffer MRN: [id number] DOB: 1946-02-27 Today's Date: 05/16/2013 Time: 8416-6063 PT Time Calculation (min): 17 min  PT Assessment / Plan / Recommendation  History of Present Illness 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; 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    PT Comments   Pt is progressing with mobility. Pt is at supervision level for mobility due to safety deficits and cues for RW management. Patient needs to practice stairs next session prior to D/C home with wife and sons assistance.  Follow Up Recommendations  Home health PT;Supervision/Assistance - 24 hour     Does the patient have the potential to tolerate intense rehabilitation     Barriers to Discharge        Equipment Recommendations  None recommended by PT    Recommendations for Other Services    Frequency 7X/week   Progress towards PT Goals Progress towards PT goals: Progressing toward goals  Plan Current plan remains appropriate    Precautions / Restrictions Precautions Precautions: Knee;Fall Precaution Comments: NO ROM Required Braces or Orthoses: Knee Immobilizer - Left Knee Immobilizer - Left: On at all times Restrictions Weight Bearing Restrictions: Yes LLE Weight Bearing: Weight bearing as tolerated   Pertinent Vitals/Pain 6/10;  C/o pain in Rt side     Mobility  Bed Mobility Bed Mobility: Not assessed Transfers Transfers: Sit to Stand;Stand to Sit Sit to Stand: 5: Supervision;From chair/3-in-1;With armrests Stand to Sit: 5: Supervision;To chair/3-in-1;With armrests Details for Transfer Assistance: min cues for hand placement and supervision for safety  Ambulation/Gait Ambulation/Gait Assistance: 5: Supervision Ambulation Distance (Feet): 150 Feet Assistive device: Rolling walker Ambulation/Gait Assistance Details: supervision for safety and cues to stay within RW   Gait Pattern: Step-to pattern;Wide base of support;Trunk flexed Gait velocity: decreased; pt  unsafe to incr  General Gait Details: cues for upright posture  Stairs: No Wheelchair Mobility Wheelchair Mobility: No    Exercises Total Joint Exercises Ankle Circles/Pumps: AROM;Both;10 reps;Supine;Seated   PT Diagnosis:    PT Problem List:   PT Treatment Interventions:     PT Goals (current goals can now be found in the care plan section) Acute Rehab PT Goals Patient Stated Goal: to go home soon  PT Goal Formulation: With patient Time For Goal Achievement: 05/19/13 Potential to Achieve Goals: Good  Visit Information  Last PT Received On: 05/16/13 Assistance Needed: +1 History of Present Illness: 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; 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     Subjective Data  Subjective: Pt sitting in chair with wife present; "[name] been having major anxiety today. I dont knkow why but i feel like i could just crawl out that window"  Patient Stated Goal: to go home soon    Cognition  Cognition Arousal/Alertness: Awake/alert Behavior During Therapy: WFL for tasks assessed/performed Overall Cognitive Status: Within Functional Limits for tasks assessed Area of Impairment: Problem solving Following Commands: Follows one step commands inconsistently;Follows one step commands with increased time Safety/Judgement: Decreased awareness of safety;Decreased awareness of deficits Problem Solving: Difficulty sequencing;Decreased initiation;Requires verbal cues;Requires tactile cues    Balance  Balance Balance Assessed: No  End of Session PT - End of Session Equipment Utilized During Treatment: Gait belt;Left knee immobilizer Activity Tolerance: Patient tolerated treatment well Patient left: with family/visitor present;in chair Nurse Communication: Mobility status;Patient requests pain meds. Pt sitting EOB with wife present    GP     [name], [name] [phone] 05/16/2013, 9:56 AM

## 2013-05-16 NOTE — Progress Notes (Signed)
Physical Therapy Treatment Patient Details Name: Edwin Shaffer MRN: [id number] DOB: 1946/07/29 Today's Date: 05/16/2013 Time: 1355-1420 PT Time Calculation (min): 25 min  PT Assessment / Plan / Recommendation  History of Present Illness 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; 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    PT Comments   Session focused on educating wife and pt on proper stair amb technique. Pt prefers his own technique but was agreeable to suggestions made by therapy to increase safety and reduce risk of falls. Pt is going to have 24/7 (A) at all times and is safe from mobility standpoint to D/C home.   Follow Up Recommendations  Home health PT;Supervision/Assistance - 24 hour     Does the patient have the potential to tolerate intense rehabilitation     Barriers to Discharge        Equipment Recommendations  None recommended by PT    Recommendations for Other Services    Frequency 7X/week   Progress towards PT Goals Progress towards PT goals: Goals met/education completed, patient discharged from PT  Plan Current plan remains appropriate    Precautions / Restrictions Precautions Precautions: Knee;Fall Precaution Comments: NO ROM Required Braces or Orthoses: Knee Immobilizer - Left Knee Immobilizer - Left: On at all times Restrictions Weight Bearing Restrictions: Yes LLE Weight Bearing: Weight bearing as tolerated   Pertinent Vitals/Pain Just my side hurts. But "it aint too bad" patient repositioned for comfort     Mobility  Bed Mobility Bed Mobility: Not assessed Transfers Transfers: Sit to Stand;Stand to Sit Sit to Stand: 5: Supervision;From chair/3-in-1;With armrests Stand to Sit: 5: Supervision;To chair/3-in-1;With armrests Details for Transfer Assistance: supervision for safety and min cues  Ambulation/Gait Ambulation/Gait Assistance: 5: Supervision Ambulation Distance (Feet): 400 Feet Assistive device: Rolling walker Ambulation/Gait Assistance Details: cues for RW  management and safety  Gait Pattern: Step-to pattern;Wide base of support;Trunk flexed Gait velocity: decreased; pt unsafe to incr  General Gait Details: cues for upright posture  Stairs: Yes Stairs Assistance: 4: Min guard;5: Supervision Stairs Assistance Details (indicate cue type and reason): pt prefers his method to amb steps; educated pt and wife on proper technique and (A) that needs to be provided by family; (A) to steady RW; wife verbalized understanding and stated sons would be home to help them get into house Stair Management Technique: No rails;Step to pattern;Forwards;With walker Number of Stairs: 1 Wheelchair Mobility Wheelchair Mobility: No         PT Diagnosis:    PT Problem List:   PT Treatment Interventions:     PT Goals (current goals can now be found in the care plan section) Acute Rehab PT Goals Patient Stated Goal: home today PT Goal Formulation: With patient Time For Goal Achievement: 05/19/13 Potential to Achieve Goals: Good  Visit Information  Last PT Received On: 05/16/13 Assistance Needed: +1 History of Present Illness: 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; 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     Subjective Data  Subjective: Pt sitting in recliner; "lets do the steps so i can go. come on honey, go with [place] and show her how we do them"  Patient Stated Goal: home today   Cognition  Cognition Arousal/Alertness: Awake/alert Behavior During Therapy: WFL for tasks assessed/performed Overall Cognitive Status: Within Functional Limits for tasks assessed Area of Impairment: Problem solving Following Commands: Follows one step commands inconsistently;Follows one step commands with increased time Safety/Judgement: Decreased awareness of safety;Decreased awareness of deficits Problem Solving: Difficulty sequencing;Decreased initiation;Requires verbal cues;Requires  tactile cues    Balance  Balance Balance Assessed: No  End of Session PT - End of Session Equipment Utilized  During Treatment: Gait belt;Left knee immobilizer Activity Tolerance: Patient tolerated treatment well Patient left: with family/visitor present;in chair Nurse Communication: Mobility status   GP     [name], [place] [phone] 05/16/2013, 3:59 PM

## 2013-05-16 NOTE — Progress Notes (Signed)
Patient provided with discharge instructions and follow up information. He is going home with IV antibiotics for 6 weeks through [place]. He will follow up with Dr. Turner Daniels in 2 weeks.

## 2013-05-16 NOTE — Progress Notes (Signed)
Patient brought in home med, administered all 4 tablets during his stay. Will not be discharging him with med from home as it has run out during the admission. Notified by pharmacist who is aware.

## 2013-05-16 NOTE — Progress Notes (Signed)
PATIENT ID: Edwin Shaffer  MRN: [id number]  DOB/AGE:  May 29, 1946 / 67 y.o.  5 Days Post-Op Procedure(s) (LRB): IRRIGATION AND DEBRIDEMENT KNEE WITH POLY EXCHANGE (Left)    PROGRESS NOTE Subjective: Patient is alert, oriented, no Nausea, no Vomiting, yes passing gas, yes Bowel Movement. Taking PO well, pt up in bed eating. Denies SOB, Chest or Calf Pain. Using Incentive Spirometer, PAS in place. Ambulate WBAT with knee immobilizer. Patient reports pain as mild  .    Objective: Vital signs in last 24 hours: Filed Vitals:   05/15/13 1256 05/15/13 2008 05/16/13 0553 05/16/13 0619  BP: 168/89 143/77 163/91 158/80  Pulse: 93 95 99   Temp: 99.8 F (37.7 C) 99.4 F (37.4 C) 98.9 F (37.2 C)   TempSrc:  Oral Oral   Resp: 16 16 16    Height:      Weight:      SpO2: 99% 97% 94%       Intake/Output from previous day: I/O last 3 completed shifts: In: 1220 [P.O.:720; Other:100; IV Piggyback:400] Out: 400 [Urine:400]   Intake/Output this shift:     LABORATORY DATA:  Recent Labs  05/14/13 0500  05/15/13 0500  05/15/13 1627 05/15/13 2143 05/16/13 0649  WBC 9.0  --   --   --   --   --   --   HGB 8.9*  --   --   --   --   --   --   HCT 26.6*  --   --   --   --   --   --   PLT 353  --   --   --   --   --   --   NA 135  --  132*  --   --   --   --   K 4.1  --  4.1  --   --   --   --   CL 101  --  98  --   --   --   --   CO2 24  --  24  --   --   --   --   BUN 15  --  15  --   --   --   --   CREATININE 1.10  --  1.07  --   --   --   --   GLUCOSE 149*  --  129*  --   --   --   --   GLUCAP  --   < >  --   < > 131* 148* 136*  CALCIUM 9.0  --  8.9  --   --   --   --   < > = values in this interval not displayed.  Examination: Neurologically intact Neurovascular intact Sensation intact distally Intact pulses distally Dorsiflexion/Plantar flexion intact Incision: moderate drainage No cellulitis present Compartment soft}  Assessment:   5 Days Post-Op Procedure(s)  (LRB): IRRIGATION AND DEBRIDEMENT KNEE WITH POLY EXCHANGE (Left) ADDITIONAL DIAGNOSIS:  Acute Blood Loss Anemia, Diabetes and Hypertension  Plan: PT/OT WBAT, patient will be a knee immobilizer for 3-4 weeks to hold the knee still and minimize the chance for reaccumulation of fluid DVT Prophylaxis:  SCDx72hrs, ASA 325 mg BID x 2 weeks DISCHARGE PLAN: Home when wound becomes dry.   DISCHARGE NEEDS: HHPT, HHRN, Walker, 3-in-1 comode seat and IV Antibiotics, antibiotics at this time consist of IV vancomycin followed by home health, oral rifampin and cefepime.  These are  to be continued at home for 6 weeks.     Jeena Arnett R 05/16/2013, 8:18 AM

## 2013-05-16 NOTE — Discharge Summary (Signed)
Patient ID: Edwin Shaffer MRN: [id number] DOB/AGE: Sep 02, 1945 67 y.o.  Admit date: 05/10/2013 Discharge date: 05/16/2013  Admission Diagnoses:  Principal Problem:   Infection of total left knee replacement   Discharge Diagnoses:  Same  Past Medical History  Diagnosis Date  . Hypertension   . Diabetes mellitus without complication   . Cancer     vocal cord  . Arthritis     Surgeries: Procedure(s): IRRIGATION AND DEBRIDEMENT KNEE WITH POLY EXCHANGE on 05/10/2013 - 05/11/2013   Consultants:    Discharged Condition: Improved  Hospital Course: Edwin Shaffer is an 67 y.o. male who was admitted 05/10/2013 for operative treatment ofInfection of total left knee replacement. Patient has severe unremitting pain that affects sleep, daily activities, and work/hobbies. After pre-op clearance the patient was taken to the operating room on 05/10/2013 - 05/11/2013 and underwent  Procedure(s): IRRIGATION AND DEBRIDEMENT KNEE WITH POLY EXCHANGE.    Patient was given perioperative antibiotics: Anti-infectives   Start     Dose/Rate Route Frequency Ordered Stop   05/16/13 0000  dextrose 5 % SOLN 50 mL with ceFEPIme 2 G SOLR 2 g    Comments:  OUT PATIENT THERAPY to be administered by home health   2 g 100 mL/hr over 30 Minutes Intravenous Every 12 hours 05/16/13 1052     05/16/13 0000  sodium chloride 0.9 % SOLN 500 mL with vancomycin 10 G SOLR 1,500 mg    Comments:  Out patient therapy to be administered by home health   1,500 mg 250 mL/hr over 120 Minutes Intravenous Every 12 hours 05/16/13 1057     05/16/13 0000  rifampin (RIFADIN) 300 MG capsule     300 mg Oral Daily 05/16/13 1057     05/15/13 2200  vancomycin (VANCOCIN) 1,500 mg in sodium chloride 0.9 % 500 mL IVPB     1,500 mg 250 mL/hr over 120 Minutes Intravenous Every 12 hours 05/15/13 1341     05/15/13 1415  vancomycin (VANCOCIN) 500 mg in sodium chloride 0.9 % 100 mL IVPB     500 mg 100 mL/hr over 60 Minutes Intravenous NOW  05/15/13 1341 05/15/13 1454   05/14/13 1700  ceFEPIme (MAXIPIME) 2 g in dextrose 5 % 50 mL IVPB     2 g 100 mL/hr over 30 Minutes Intravenous Every 12 hours 05/14/13 1619     05/14/13 1700  rifampin (RIFADIN) capsule 300 mg     300 mg Oral Daily 05/14/13 1619     05/11/13 1000  vancomycin (VANCOCIN) 1,250 mg in sodium chloride 0.9 % 250 mL IVPB  Status:  Discontinued     1,250 mg 166.7 mL/hr over 90 Minutes Intravenous Every 12 hours 05/10/13 2152 05/15/13 1341   05/10/13 1930  vancomycin (VANCOCIN) 2,500 mg in sodium chloride 0.9 % 500 mL IVPB     2,500 mg 250 mL/hr over 120 Minutes Intravenous  Once 05/10/13 1847 05/10/13 2351       Patient was given sequential compression devices, early ambulation, and chemoprophylaxis to prevent DVT.  Patient benefited maximally from hospital stay and there were no complications.    Recent vital signs: Patient Vitals for the past 24 hrs:  BP Temp Temp src Pulse Resp SpO2  05/16/13 0619 158/80 mmHg - - - - -  05/16/13 0553 163/91 mmHg 98.9 F (37.2 C) Oral 99 16 94 %  05/15/13 2008 143/77 mmHg 99.4 F (37.4 C) Oral 95 16 97 %  05/15/13 1256 168/89 mmHg 99.8 F (37.7 C) -  93 16 99 %  05/15/13 1139 - - - - 13 96 %     Recent laboratory studies:  Recent Labs  05/14/13 0500 05/15/13 0500  WBC 9.0  --   HGB 8.9*  --   HCT 26.6*  --   PLT 353  --   NA 135 132*  K 4.1 4.1  CL 101 98  CO2 24 24  BUN 15 15  CREATININE 1.10 1.07  GLUCOSE 149* 129*  CALCIUM 9.0 8.9     Discharge Medications:     Medication List         aspirin EC 325 MG tablet  Take 1 tablet (325 mg total) by mouth 2 (two) times daily.     dextrose 5 % SOLN 50 mL with ceFEPIme 2 G SOLR 2 g  Inject 2 g into the vein every 12 (twelve) hours.     diltiazem 300 MG 24 hr capsule  Commonly known as:  TIAZAC  Take 300 mg by mouth daily.     FARXIGA 5 MG Tabs  Generic drug:  Dapagliflozin Propanediol  Take 5 mg by mouth daily.     insulin glargine 100 UNIT/ML  injection  Commonly known as:  LANTUS  Inject 20 Units into the skin at bedtime.     insulin lispro protamine-lispro (75-25) 100 UNIT/ML Susp injection  Commonly known as:  HUMALOG 75/25  Inject 45 Units into the skin daily with breakfast.     lisinopril 20 MG tablet  Commonly known as:  PRINIVIL,ZESTRIL  Take 20 mg by mouth daily.     methocarbamol 500 MG tablet  Commonly known as:  ROBAXIN  Take 1 tablet (500 mg total) by mouth 2 (two) times daily with a meal.     oxyCODONE-acetaminophen 5-325 MG per tablet  Commonly known as:  ROXICET  Take 1 tablet by mouth every 4 (four) hours as needed for pain.     rifampin 300 MG capsule  Commonly known as:  RIFADIN  Take 1 capsule (300 mg total) by mouth daily.     sodium chloride 0.9 % SOLN 500 mL with vancomycin 10 G SOLR 1,500 mg  Inject 1,500 mg into the vein every 12 (twelve) hours.     tamsulosin 0.4 MG Caps capsule  Commonly known as:  FLOMAX  Take by mouth daily.        Diagnostic Studies: Dg Chest 2 View  04/17/2013   *RADIOLOGY REPORT*  Clinical Data: Preoperative evaluation for left knee replacement, history hypertension, diabetes, former smoker  CHEST - 2 VIEW  Comparison: None  Findings: Normal heart size, mediastinal contours, and pulmonary vascularity. Minimal peribronchial thickening. No acute infiltrate, pleural effusion or pneumothorax. Broad-based dextroconvex thoracic scoliosis with scattered endplate spur formation thoracic spine. No acute osseous findings.  IMPRESSION: Mild bronchitic changes.   Original Report Authenticated By: Ulyses Southward, M.D.    Disposition: 03-Skilled Nursing Facility      Discharge Orders   Future Orders Complete By Expires   Call MD / Call 911  As directed    Comments:     If you experience chest pain or shortness of breath, CALL 911 and be transported to the hospital emergency room.  If you develope a fever above 101 F, pus (white drainage) or increased drainage or redness at the  wound, or calf pain, call your surgeon's office.   Change dressing  As directed    Comments:     Change dressing on 5, then change the  dressing daily with sterile 4 x 4 inch gauze dressing and apply TED hose.  You may clean the incision with alcohol prior to redressing.   Constipation Prevention  As directed    Comments:     Drink plenty of fluids.  Prune juice may be helpful.  You may use a stool softener, such as Colace (over the counter) 100 mg twice a day.  Use MiraLax (over the counter) for constipation as needed.   Diet - low sodium heart healthy  As directed    Discharge instructions  As directed    Comments:     Follow up 5-7 days with Dr. Turner Daniels   Driving restrictions  As directed    Comments:     No driving for 2 weeks   Increase activity slowly as tolerated  As directed    Patient may shower  As directed    Comments:     You may shower without a dressing once there is no drainage.  Do not wash over the wound.  If drainage remains, cover wound with plastic wrap and then shower.      Follow-up Information   Follow up with [name], MD In 5 days.   Specialty:  Orthopedic Surgery   Contact information:   [address] [place] [address] [phone]        Signed: Henry Russel 05/16/2013, 11:32 AM

## 2013-05-21 NOTE — Progress Notes (Signed)
PT is recommending home with HH and not SNF.  Clinical Social Worker will sign off for now as social work intervention is no longer needed. Please consult us again if new need arises.   Edwin Shaffer, MSW [phone] 

## 2013-05-21 NOTE — Progress Notes (Deleted)
Clinical social worker assisted with patient discharge to skilled nursing facility, [facility].  CSW addressed all family questions and concerns. CSW copied chart and added all important documents. CSW also set up patient transportation with [facility]. Clinical Social Worker will sign off for now as social work intervention is no longer needed.   Rik Wadel, MSW,  [phone] 

## 2013-05-22 ENCOUNTER — Inpatient Hospital Stay (HOSPITAL_COMMUNITY)
Admission: AD | Admit: 2013-05-22 | Discharge: 2013-05-30 | DRG: 559 | Disposition: A | Discharge status: Home Health | Payer: BC Managed Care – PPO | Source: Ambulatory Visit | Attending: Orthopedic Surgery | Admitting: Orthopedic Surgery

## 2013-05-22 DIAGNOSIS — Z96659 Presence of unspecified artificial knee joint: Secondary | ICD-10-CM

## 2013-05-22 DIAGNOSIS — Z8521 Personal history of malignant neoplasm of larynx: Secondary | ICD-10-CM

## 2013-05-22 DIAGNOSIS — M009 Pyogenic arthritis, unspecified: Secondary | ICD-10-CM | POA: Diagnosis present

## 2013-05-22 DIAGNOSIS — T8450XA Infection and inflammatory reaction due to unspecified internal joint prosthesis, initial encounter: Principal | ICD-10-CM | POA: Diagnosis present

## 2013-05-22 DIAGNOSIS — D62 Acute posthemorrhagic anemia: Secondary | ICD-10-CM | POA: Diagnosis not present

## 2013-05-22 DIAGNOSIS — T8454XD Infection and inflammatory reaction due to internal left knee prosthesis, subsequent encounter: Secondary | ICD-10-CM

## 2013-05-22 DIAGNOSIS — T8454XA Infection and inflammatory reaction due to internal left knee prosthesis, initial encounter: Secondary | ICD-10-CM

## 2013-05-22 DIAGNOSIS — I10 Essential (primary) hypertension: Secondary | ICD-10-CM | POA: Diagnosis present

## 2013-05-22 DIAGNOSIS — Y831 Surgical operation with implant of artificial internal device as the cause of abnormal reaction of the patient, or of later complication, without mention of misadventure at the time of the procedure: Secondary | ICD-10-CM | POA: Diagnosis present

## 2013-05-22 DIAGNOSIS — R339 Retention of urine, unspecified: Secondary | ICD-10-CM | POA: Diagnosis not present

## 2013-05-22 DIAGNOSIS — E119 Type 2 diabetes mellitus without complications: Secondary | ICD-10-CM | POA: Diagnosis present

## 2013-05-22 LAB — URINALYSIS, ROUTINE W REFLEX MICROSCOPIC
Bilirubin Urine: NEGATIVE
Glucose, UA: NEGATIVE mg/dL
Ketones, ur: NEGATIVE mg/dL
Leukocytes, UA: NEGATIVE
Protein, ur: >300 mg/dL — AB

## 2013-05-22 LAB — CBC WITH DIFFERENTIAL/PLATELET
Basophils Absolute: 0 10*3/uL (ref 0.0–0.1)
Eosinophils Relative: 3 % (ref 0–5)
Lymphocytes Relative: 18 % (ref 12–46)
Neutro Abs: 7.6 10*3/uL (ref 1.7–7.7)
Platelets: 470 10*3/uL — ABNORMAL HIGH (ref 150–400)
RDW: 13.4 % (ref 11.5–15.5)
WBC: 11.2 10*3/uL — ABNORMAL HIGH (ref 4.0–10.5)

## 2013-05-22 LAB — SEDIMENTATION RATE: Sed Rate: 90 mm/hr — ABNORMAL HIGH (ref 0–16)

## 2013-05-22 LAB — BASIC METABOLIC PANEL
Chloride: 101 mEq/L (ref 96–112)
Creatinine, Ser: 0.92 mg/dL (ref 0.50–1.35)
GFR calc Af Amer: >90 mL/min (ref >90)
Potassium: 3.4 mEq/L — ABNORMAL LOW (ref 3.5–5.1)

## 2013-05-22 LAB — PROTIME-INR: Prothrombin Time: 14.4 seconds (ref 11.6–15.2)

## 2013-05-22 LAB — URINE MICROSCOPIC-ADD ON

## 2013-05-22 LAB — GLUCOSE, CAPILLARY: Glucose-Capillary: 145 mg/dL — ABNORMAL HIGH (ref 70–99)

## 2013-05-22 MED ORDER — INSULIN ASPART 100 UNIT/ML ~~LOC~~ SOLN: 0.0000 [IU] | Freq: Three times a day (TID) | SUBCUTANEOUS | Status: DC

## 2013-05-22 MED ORDER — ONDANSETRON HCL 4 MG/2ML IJ SOLN: 4.0000 mg | Freq: Four times a day (QID) | INTRAMUSCULAR | Status: DC | PRN

## 2013-05-22 MED ORDER — INSULIN ASPART 100 UNIT/ML ~~LOC~~ SOLN: 0.0000 [IU] | Freq: Every day | SUBCUTANEOUS | Status: DC

## 2013-05-22 MED ORDER — OXYCODONE-ACETAMINOPHEN 5-325 MG PO TABS: 1.0000 | ORAL_TABLET | ORAL | Status: DC | PRN

## 2013-05-22 MED ORDER — DEXTROSE 5 % IV SOLN
3.0000 g | INTRAVENOUS | Status: AC
  Administered 2013-05-23: 3 g via INTRAVENOUS
  Filled 2013-05-22 (×2): qty 3000

## 2013-05-22 MED ORDER — RIFAMPIN 300 MG PO CAPS
300.0000 mg | ORAL_CAPSULE | Freq: Every day | ORAL | Status: DC
  Administered 2013-05-23: 300 mg via ORAL
  Filled 2013-05-22: qty 1

## 2013-05-22 MED ORDER — DOCUSATE SODIUM 100 MG PO CAPS
100.0000 mg | ORAL_CAPSULE | Freq: Two times a day (BID) | ORAL | Status: DC
  Administered 2013-05-22: 100 mg via ORAL
  Filled 2013-05-22 (×3): qty 1

## 2013-05-22 MED ORDER — VANCOMYCIN HCL 10 G IV SOLR
1500.0000 mg | Freq: Two times a day (BID) | INTRAVENOUS | Status: DC
  Administered 2013-05-22 – 2013-05-30 (×15): 1500 mg via INTRAVENOUS
  Filled 2013-05-22 (×19): qty 1500

## 2013-05-22 MED ORDER — LISINOPRIL 20 MG PO TABS
20.0000 mg | ORAL_TABLET | Freq: Every day | ORAL | Status: DC
  Administered 2013-05-23: 20 mg via ORAL
  Filled 2013-05-22 (×2): qty 1

## 2013-05-22 MED ORDER — CHLORHEXIDINE GLUCONATE 4 % EX LIQD
60.0000 mL | Freq: Once | CUTANEOUS | Status: AC
  Administered 2013-05-23: 4 via TOPICAL
  Filled 2013-05-22: qty 60

## 2013-05-22 MED ORDER — METHOCARBAMOL 500 MG PO TABS
500.0000 mg | ORAL_TABLET | Freq: Two times a day (BID) | ORAL | Status: DC
  Filled 2013-05-22 (×2): qty 1

## 2013-05-22 MED ORDER — CHLORHEXIDINE GLUCONATE 4 % EX LIQD
60.0000 mL | Freq: Once | CUTANEOUS | Status: DC
  Filled 2013-05-22: qty 60

## 2013-05-22 MED ORDER — OXYCODONE-ACETAMINOPHEN 5-325 MG PO TABS
1.0000 | ORAL_TABLET | ORAL | Status: DC | PRN
  Administered 2013-05-22 – 2013-05-23 (×4): 2 via ORAL
  Filled 2013-05-22 (×4): qty 2

## 2013-05-22 MED ORDER — TAMSULOSIN HCL 0.4 MG PO CAPS
0.4000 mg | ORAL_CAPSULE | Freq: Every day | ORAL | Status: DC
  Filled 2013-05-22: qty 1

## 2013-05-22 MED ORDER — SODIUM CHLORIDE 0.9 % IJ SOLN
10.0000 mL | INTRAMUSCULAR | Status: DC | PRN
  Administered 2013-05-22: 10 mL

## 2013-05-22 MED ORDER — DILTIAZEM HCL ER BEADS 300 MG PO CP24
300.0000 mg | ORAL_CAPSULE | Freq: Every day | ORAL | Status: DC
  Administered 2013-05-23: 300 mg via ORAL
  Filled 2013-05-22 (×2): qty 1

## 2013-05-22 MED ORDER — DAPAGLIFLOZIN PROPANEDIOL 5 MG PO TABS: 5.0000 mg | ORAL_TABLET | Freq: Every day | ORAL | Status: DC

## 2013-05-22 MED ORDER — DEXTROSE 5 % IV SOLN
2.0000 g | Freq: Two times a day (BID) | INTRAVENOUS | Status: DC
  Administered 2013-05-22 – 2013-05-30 (×16): 2 g via INTRAVENOUS
  Filled 2013-05-22 (×17): qty 2

## 2013-05-22 MED ORDER — SODIUM CHLORIDE 0.9 % IJ SOLN: 10.0000 mL | Freq: Two times a day (BID) | INTRAMUSCULAR | Status: DC

## 2013-05-22 MED ORDER — FLEET ENEMA 7-19 GM/118ML RE ENEM: 1.0000 | ENEMA | Freq: Once | RECTAL | Status: AC | PRN

## 2013-05-22 MED ORDER — DEXTROSE-NACL 5-0.45 % IV SOLN
INTRAVENOUS | Status: DC
  Administered 2013-05-22: 19:00:00 via INTRAVENOUS

## 2013-05-22 MED ORDER — SENNOSIDES-DOCUSATE SODIUM 8.6-50 MG PO TABS: 1.0000 | ORAL_TABLET | Freq: Every evening | ORAL | Status: DC | PRN

## 2013-05-22 MED ORDER — CEFAZOLIN SODIUM-DEXTROSE 2-3 GM-% IV SOLR: 2.0000 g | INTRAVENOUS | Status: DC

## 2013-05-22 MED ORDER — BISACODYL 5 MG PO TBEC: 5.0000 mg | DELAYED_RELEASE_TABLET | Freq: Every day | ORAL | Status: DC | PRN
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
## 2013-05-23 ENCOUNTER — Encounter (HOSPITAL_COMMUNITY): Payer: Self-pay | Admitting: Anesthesiology

## 2013-05-23 ENCOUNTER — Inpatient Hospital Stay: Admit: 2013-05-23 | Payer: Self-pay | Admitting: Orthopedic Surgery

## 2013-05-23 ENCOUNTER — Inpatient Hospital Stay (HOSPITAL_COMMUNITY): Payer: BC Managed Care – PPO | Admitting: Anesthesiology

## 2013-05-23 ENCOUNTER — Encounter (HOSPITAL_COMMUNITY)
Admission: AD | Disposition: A | Discharge status: Home Health | Payer: Self-pay | Source: Ambulatory Visit | Attending: Orthopedic Surgery

## 2013-05-23 HISTORY — PX: TOTAL KNEE REVISION: SHX996

## 2013-05-23 LAB — HEMOGLOBIN A1C: Mean Plasma Glucose: 154 mg/dL — ABNORMAL HIGH (ref <117)

## 2013-05-23 LAB — SURGICAL PCR SCREEN: Staphylococcus aureus: NEGATIVE

## 2013-05-23 LAB — C-REACTIVE PROTEIN: CRP: 4.1 mg/dL — ABNORMAL HIGH (ref <0.60)

## 2013-05-23 LAB — GLUCOSE, CAPILLARY

## 2013-05-23 SURGERY — TOTAL KNEE REVISION
Anesthesia: General | Site: Knee | Laterality: Left | Wound class: Dirty or Infected

## 2013-05-23 MED ORDER — VANCOMYCIN HCL 1000 MG IV SOLR
INTRAVENOUS | Status: DC | PRN
  Administered 2013-05-23: 4000 mg

## 2013-05-23 MED ORDER — METHOCARBAMOL 500 MG PO TABS
500.0000 mg | ORAL_TABLET | Freq: Four times a day (QID) | ORAL | Status: DC | PRN
  Administered 2013-05-23 – 2013-05-30 (×16): 500 mg via ORAL
  Filled 2013-05-23 (×16): qty 1

## 2013-05-23 MED ORDER — LACTATED RINGERS IV SOLN
INTRAVENOUS | Status: DC | PRN
  Administered 2013-05-23 (×2): via INTRAVENOUS

## 2013-05-23 MED ORDER — MIDAZOLAM HCL 5 MG/5ML IJ SOLN
INTRAMUSCULAR | Status: DC | PRN
  Administered 2013-05-23: 1 mg via INTRAVENOUS

## 2013-05-23 MED ORDER — HYDROMORPHONE HCL PF 1 MG/ML IJ SOLN
INTRAMUSCULAR | Status: AC
  Administered 2013-05-23: 0.5 mg via INTRAVENOUS
  Filled 2013-05-23: qty 1

## 2013-05-23 MED ORDER — BUPIVACAINE HCL (PF) 0.5 % IJ SOLN
INTRAMUSCULAR | Status: DC | PRN
  Administered 2013-05-23: 150 mg

## 2013-05-23 MED ORDER — MIDAZOLAM HCL 2 MG/2ML IJ SOLN: 0.5000 mg | Freq: Once | INTRAMUSCULAR | Status: DC | PRN

## 2013-05-23 MED ORDER — ONDANSETRON HCL 4 MG/2ML IJ SOLN
INTRAMUSCULAR | Status: DC | PRN
  Administered 2013-05-23: 4 mg via INTRAVENOUS

## 2013-05-23 MED ORDER — MIDAZOLAM HCL 2 MG/2ML IJ SOLN
INTRAMUSCULAR | Status: AC
  Administered 2013-05-23: 1 mg via INTRAVENOUS
  Filled 2013-05-23: qty 2

## 2013-05-23 MED ORDER — FENTANYL CITRATE 0.05 MG/ML IJ SOLN
INTRAMUSCULAR | Status: AC
  Administered 2013-05-23: 50 ug via INTRAVENOUS
  Filled 2013-05-23: qty 2

## 2013-05-23 MED ORDER — MEPERIDINE HCL 25 MG/ML IJ SOLN
6.2500 mg | INTRAMUSCULAR | Status: DC | PRN
Start: 2013-05-23 — End: 2013-05-23

## 2013-05-23 MED ORDER — ONDANSETRON HCL 4 MG/2ML IJ SOLN
4.0000 mg | Freq: Four times a day (QID) | INTRAMUSCULAR | Status: DC | PRN
  Administered 2013-05-23: 4 mg via INTRAVENOUS
  Filled 2013-05-23: qty 2

## 2013-05-23 MED ORDER — VANCOMYCIN HCL 1000 MG IV SOLR
4000.0000 mg | INTRAVENOUS | Status: DC
  Filled 2013-05-23: qty 4000

## 2013-05-23 MED ORDER — HYDROMORPHONE HCL PF 1 MG/ML IJ SOLN
0.2500 mg | INTRAMUSCULAR | Status: DC | PRN
Start: 2013-05-23 — End: 2013-05-23
  Administered 2013-05-23 (×3): 0.5 mg via INTRAVENOUS

## 2013-05-23 MED ORDER — ACETAMINOPHEN 650 MG RE SUPP: 650.0000 mg | Freq: Four times a day (QID) | RECTAL | Status: DC | PRN

## 2013-05-23 MED ORDER — PHENOL 1.4 % MT LIQD: 1.0000 | OROMUCOSAL | Status: DC | PRN

## 2013-05-23 MED ORDER — METOCLOPRAMIDE HCL 5 MG/ML IJ SOLN: 5.0000 mg | Freq: Three times a day (TID) | INTRAMUSCULAR | Status: DC | PRN

## 2013-05-23 MED ORDER — ARTIFICIAL TEARS OP OINT
TOPICAL_OINTMENT | OPHTHALMIC | Status: DC | PRN
  Administered 2013-05-23: 1 via OPHTHALMIC

## 2013-05-23 MED ORDER — KCL IN DEXTROSE-NACL 20-5-0.45 MEQ/L-%-% IV SOLN
INTRAVENOUS | Status: DC
  Administered 2013-05-24 – 2013-05-26 (×3): via INTRAVENOUS
  Filled 2013-05-23 (×23): qty 1000

## 2013-05-23 MED ORDER — GLYCOPYRROLATE 0.2 MG/ML IJ SOLN
INTRAMUSCULAR | Status: DC | PRN
  Administered 2013-05-23: 0.6 mg via INTRAVENOUS

## 2013-05-23 MED ORDER — ASPIRIN EC 325 MG PO TBEC
325.0000 mg | DELAYED_RELEASE_TABLET | Freq: Every day | ORAL | Status: DC
  Administered 2013-05-24 – 2013-05-30 (×7): 325 mg via ORAL
  Filled 2013-05-23 (×8): qty 1

## 2013-05-23 MED ORDER — LACTATED RINGERS IV SOLN
INTRAVENOUS | Status: DC
  Administered 2013-05-23: 13:00:00 via INTRAVENOUS

## 2013-05-23 MED ORDER — TOBRAMYCIN SULFATE 1.2 G IJ SOLR
INTRAMUSCULAR | Status: DC | PRN
  Administered 2013-05-23: 4.8 g

## 2013-05-23 MED ORDER — INSULIN ASPART 100 UNIT/ML ~~LOC~~ SOLN
0.0000 [IU] | Freq: Three times a day (TID) | SUBCUTANEOUS | Status: DC
  Administered 2013-05-24 (×2): 3 [IU] via SUBCUTANEOUS
  Administered 2013-05-24: 4 [IU] via SUBCUTANEOUS
  Administered 2013-05-25 (×3): 3 [IU] via SUBCUTANEOUS
  Administered 2013-05-26 (×2): 4 [IU] via SUBCUTANEOUS
  Administered 2013-05-26: 3 [IU] via SUBCUTANEOUS
  Administered 2013-05-27: 4 [IU] via SUBCUTANEOUS
  Administered 2013-05-27 – 2013-05-28 (×3): 3 [IU] via SUBCUTANEOUS
  Administered 2013-05-28: 4 [IU] via SUBCUTANEOUS
  Administered 2013-05-29 – 2013-05-30 (×4): 3 [IU] via SUBCUTANEOUS

## 2013-05-23 MED ORDER — ONDANSETRON HCL 4 MG PO TABS: 4.0000 mg | ORAL_TABLET | Freq: Four times a day (QID) | ORAL | Status: DC | PRN

## 2013-05-23 MED ORDER — DEXAMETHASONE SODIUM PHOSPHATE 4 MG/ML IJ SOLN
INTRAMUSCULAR | Status: DC | PRN
  Administered 2013-05-23: 4 mg via INTRAVENOUS

## 2013-05-23 MED ORDER — HYDROGEN PEROXIDE 3 % EX SOLN
CUTANEOUS | Status: DC | PRN
  Administered 2013-05-23: 1

## 2013-05-23 MED ORDER — PROMETHAZINE HCL 25 MG/ML IJ SOLN: 6.2500 mg | INTRAMUSCULAR | Status: DC | PRN

## 2013-05-23 MED ORDER — FENTANYL CITRATE 0.05 MG/ML IJ SOLN
INTRAMUSCULAR | Status: DC | PRN
  Administered 2013-05-23 (×2): 50 ug via INTRAVENOUS
  Administered 2013-05-23 (×2): 100 ug via INTRAVENOUS
  Administered 2013-05-23 (×4): 50 ug via INTRAVENOUS

## 2013-05-23 MED ORDER — FLEET ENEMA 7-19 GM/118ML RE ENEM: 1.0000 | ENEMA | Freq: Once | RECTAL | Status: AC | PRN

## 2013-05-23 MED ORDER — PROPOFOL 10 MG/ML IV BOLUS
INTRAVENOUS | Status: DC | PRN
  Administered 2013-05-23: 200 mg via INTRAVENOUS

## 2013-05-23 MED ORDER — HYDROMORPHONE HCL PF 1 MG/ML IJ SOLN
1.0000 mg | INTRAMUSCULAR | Status: DC | PRN
  Administered 2013-05-23: 1 mg via INTRAVENOUS
  Filled 2013-05-23: qty 1

## 2013-05-23 MED ORDER — INSULIN ASPART 100 UNIT/ML ~~LOC~~ SOLN
0.0000 [IU] | Freq: Every day | SUBCUTANEOUS | Status: DC
  Administered 2013-05-27: 2 [IU] via SUBCUTANEOUS

## 2013-05-23 MED ORDER — MIDAZOLAM HCL 2 MG/2ML IJ SOLN
1.0000 mg | INTRAMUSCULAR | Status: DC | PRN
  Administered 2013-05-23: 1 mg via INTRAVENOUS

## 2013-05-23 MED ORDER — MENTHOL 3 MG MT LOZG: 1.0000 | LOZENGE | OROMUCOSAL | Status: DC | PRN

## 2013-05-23 MED ORDER — METHOCARBAMOL 100 MG/ML IJ SOLN
500.0000 mg | Freq: Four times a day (QID) | INTRAVENOUS | Status: DC | PRN
  Filled 2013-05-23: qty 5

## 2013-05-23 MED ORDER — DIPHENHYDRAMINE HCL 12.5 MG/5ML PO ELIX: 12.5000 mg | ORAL_SOLUTION | ORAL | Status: DC | PRN

## 2013-05-23 MED ORDER — WHITE PETROLATUM GEL
Status: AC
  Filled 2013-05-23: qty 5

## 2013-05-23 MED ORDER — METOCLOPRAMIDE HCL 10 MG PO TABS: 5.0000 mg | ORAL_TABLET | Freq: Three times a day (TID) | ORAL | Status: DC | PRN

## 2013-05-23 MED ORDER — SODIUM CHLORIDE 0.9 % IR SOLN
Status: DC | PRN
  Administered 2013-05-23: 3000 mL
  Administered 2013-05-23: 1000 mL

## 2013-05-23 MED ORDER — BISACODYL 5 MG PO TBEC
5.0000 mg | DELAYED_RELEASE_TABLET | Freq: Every day | ORAL | Status: DC | PRN
  Administered 2013-05-28 – 2013-05-29 (×2): 5 mg via ORAL
  Filled 2013-05-23 (×3): qty 1

## 2013-05-23 MED ORDER — DOCUSATE SODIUM 100 MG PO CAPS
100.0000 mg | ORAL_CAPSULE | Freq: Two times a day (BID) | ORAL | Status: DC
  Administered 2013-05-23 – 2013-05-30 (×14): 100 mg via ORAL
  Filled 2013-05-23 (×14): qty 1

## 2013-05-23 MED ORDER — NEOSTIGMINE METHYLSULFATE 1 MG/ML IJ SOLN
INTRAMUSCULAR | Status: DC | PRN
  Administered 2013-05-23: 4 mg via INTRAVENOUS

## 2013-05-23 MED ORDER — SENNOSIDES-DOCUSATE SODIUM 8.6-50 MG PO TABS
1.0000 | ORAL_TABLET | Freq: Every evening | ORAL | Status: DC | PRN
  Administered 2013-05-25 – 2013-05-27 (×2): 1 via ORAL
  Filled 2013-05-23 (×2): qty 1

## 2013-05-23 MED ORDER — TOBRAMYCIN SULFATE 1.2 G IJ SOLR
4.8000 g | INTRAMUSCULAR | Status: DC
  Filled 2013-05-23: qty 4.8

## 2013-05-23 MED ORDER — ACETAMINOPHEN 325 MG PO TABS
650.0000 mg | ORAL_TABLET | Freq: Four times a day (QID) | ORAL | Status: DC | PRN
  Administered 2013-05-26: 650 mg via ORAL
  Filled 2013-05-23: qty 2

## 2013-05-23 MED ORDER — ALUM & MAG HYDROXIDE-SIMETH 200-200-20 MG/5ML PO SUSP: 30.0000 mL | ORAL | Status: DC | PRN

## 2013-05-23 MED ORDER — ALBUMIN HUMAN 5 % IV SOLN
INTRAVENOUS | Status: DC | PRN
  Administered 2013-05-23: 18:00:00 via INTRAVENOUS

## 2013-05-23 MED ORDER — OXYCODONE HCL 5 MG PO TABS
ORAL_TABLET | ORAL | Status: AC
  Filled 2013-05-23: qty 2

## 2013-05-23 MED ORDER — LIDOCAINE HCL (CARDIAC) 20 MG/ML IV SOLN
INTRAVENOUS | Status: DC | PRN
  Administered 2013-05-23: 80 mg via INTRAVENOUS

## 2013-05-23 MED ORDER — OXYCODONE HCL 5 MG PO TABS
5.0000 mg | ORAL_TABLET | ORAL | Status: DC | PRN
  Administered 2013-05-23 – 2013-05-30 (×38): 10 mg via ORAL
  Filled 2013-05-23 (×38): qty 2

## 2013-05-23 MED ORDER — VECURONIUM BROMIDE 10 MG IV SOLR
INTRAVENOUS | Status: DC | PRN
  Administered 2013-05-23: 1 mg via INTRAVENOUS
  Administered 2013-05-23: 5 mg via INTRAVENOUS

## 2013-05-23 MED ORDER — FENTANYL CITRATE 0.05 MG/ML IJ SOLN
50.0000 ug | INTRAMUSCULAR | Status: DC | PRN
  Administered 2013-05-23: 50 ug via INTRAVENOUS

## 2013-05-23 SURGICAL SUPPLY — 66 items
BANDAGE ELASTIC 4 VELCRO ST LF (GAUZE/BANDAGES/DRESSINGS) ×2 IMPLANT
BANDAGE ELASTIC 6 VELCRO ST LF (GAUZE/BANDAGES/DRESSINGS) ×3 IMPLANT
BANDAGE ESMARK 6X9 LF (GAUZE/BANDAGES/DRESSINGS) ×1 IMPLANT
BLADE SAG 18X100X1.27 (BLADE) ×1 IMPLANT
BLADE SAGITTAL 25.0X1.27X90 (BLADE) ×1 IMPLANT
BLADE SAW SAG 90X13X1.27 (BLADE) ×2 IMPLANT
BLADE SAW SGTL 81X20 HD (BLADE) ×3 IMPLANT
BLADE SURG 10 STRL SS (BLADE) ×6 IMPLANT
BLADE SURG ROTATE 9660 (MISCELLANEOUS) IMPLANT
BNDG CMPR 9X6 STRL LF SNTH (GAUZE/BANDAGES/DRESSINGS)
BNDG ESMARK 6X9 LF (GAUZE/BANDAGES/DRESSINGS)
BOWL SMART MIX CTS (DISPOSABLE) ×3 IMPLANT
CEMENT HV SMART SET (Cement) ×4 IMPLANT
CLOTH BEACON ORANGE TIMEOUT ST (SAFETY) ×1 IMPLANT
COVER BACK TABLE 24X17X13 BIG (DRAPES) IMPLANT
COVER SURGICAL LIGHT HANDLE (MISCELLANEOUS) ×3 IMPLANT
CUFF TOURNIQUET SINGLE 34IN LL (TOURNIQUET CUFF) ×2 IMPLANT
CUFF TOURNIQUET SINGLE 44IN (TOURNIQUET CUFF) IMPLANT
DISC DIAMOND MED (BURR) IMPLANT
DRAPE EXTREMITY T 121X128X90 (DRAPE) ×2 IMPLANT
DRAPE U-SHAPE 47X51 STRL (DRAPES) ×2 IMPLANT
DURAPREP 26ML APPLICATOR (WOUND CARE) ×2 IMPLANT
ELECT REM PT RETURN 9FT ADLT (ELECTROSURGICAL) ×2
ELECTRODE REM PT RTRN 9FT ADLT (ELECTROSURGICAL) ×1 IMPLANT
EVACUATOR 1/8 PVC DRAIN (DRAIN) ×2 IMPLANT
GAUZE XEROFORM 1X8 LF (GAUZE/BANDAGES/DRESSINGS) ×1 IMPLANT
GAUZE XEROFORM 5X9 LF (GAUZE/BANDAGES/DRESSINGS) ×1 IMPLANT
GLOVE BIO SURGEON STRL SZ 6.5 (GLOVE) ×1 IMPLANT
GLOVE BIO SURGEON STRL SZ7.5 (GLOVE) ×3 IMPLANT
GLOVE BIO SURGEON STRL SZ8.5 (GLOVE) ×4 IMPLANT
GLOVE BIOGEL PI IND STRL 8 (GLOVE) ×2 IMPLANT
GLOVE BIOGEL PI IND STRL 9 (GLOVE) ×1 IMPLANT
GLOVE BIOGEL PI INDICATOR 8 (GLOVE) ×1
GLOVE BIOGEL PI INDICATOR 9 (GLOVE) ×1
GOWN PREVENTION PLUS XLARGE (GOWN DISPOSABLE) ×1 IMPLANT
GOWN STRL NON-REIN LRG LVL3 (GOWN DISPOSABLE) ×2 IMPLANT
GOWN STRL REIN XL XLG (GOWN DISPOSABLE) ×4 IMPLANT
HANDPIECE INTERPULSE COAX TIP (DISPOSABLE) ×2
HOOD PEEL AWAY FACE SHEILD DIS (HOOD) ×5 IMPLANT
KIT BASIN OR (CUSTOM PROCEDURE TRAY) ×2 IMPLANT
KIT ROOM TURNOVER OR (KITS) ×2 IMPLANT
MANIFOLD NEPTUNE II (INSTRUMENTS) ×3 IMPLANT
NS IRRIG 1000ML POUR BTL (IV SOLUTION) ×2 IMPLANT
PACK TOTAL JOINT (CUSTOM PROCEDURE TRAY) ×2 IMPLANT
PAD ARMBOARD 7.5X6 YLW CONV (MISCELLANEOUS) ×4 IMPLANT
PAD CAST 4YDX4 CTTN HI CHSV (CAST SUPPLIES) ×1 IMPLANT
PADDING CAST COTTON 4X4 STRL (CAST SUPPLIES) ×2
PADDING CAST COTTON 6X4 STRL (CAST SUPPLIES) ×2 IMPLANT
RASP HELIOCORDIAL MED (MISCELLANEOUS) IMPLANT
SET HNDPC FAN SPRY TIP SCT (DISPOSABLE) ×1 IMPLANT
SPONGE GAUZE 4X4 12PLY (GAUZE/BANDAGES/DRESSINGS) ×3 IMPLANT
SPONGE LAP 18X18 X RAY DECT (DISPOSABLE) ×1 IMPLANT
STAPLER VISISTAT 35W (STAPLE) ×3 IMPLANT
SUCTION FRAZIER TIP 10 FR DISP (SUCTIONS) ×1 IMPLANT
SUT VIC AB 0 CT1 27 (SUTURE) ×2
SUT VIC AB 0 CT1 27XBRD ANBCTR (SUTURE) ×1 IMPLANT
SUT VIC AB 1 CTX 36 (SUTURE) ×2
SUT VIC AB 1 CTX36XBRD ANBCTR (SUTURE) ×1 IMPLANT
SUT VIC AB 2-0 CT1 27 (SUTURE) ×2
SUT VIC AB 2-0 CT1 TAPERPNT 27 (SUTURE) ×1 IMPLANT
TAPE STRIPS DRAPE STRL (GAUZE/BANDAGES/DRESSINGS) ×1 IMPLANT
TOWEL OR 17X24 6PK STRL BLUE (TOWEL DISPOSABLE) ×2 IMPLANT
TOWEL OR 17X26 10 PK STRL BLUE (TOWEL DISPOSABLE) ×2 IMPLANT
TRAY FOLEY CATH 14FR (SET/KITS/TRAYS/PACK) ×1 IMPLANT
TUBE ANAEROBIC SPECIMEN COL (MISCELLANEOUS) IMPLANT
WATER STERILE IRR 1000ML POUR (IV SOLUTION) ×3 IMPLANT

## 2013-05-23 NOTE — Preoperative (Signed)
Beta Blockers   Reason not to administer Beta Blockers:Not Applicable 

## 2013-05-23 NOTE — Anesthesia Procedure Notes (Signed)
Anesthesia Regional Block:  Femoral nerve block  Pre-Anesthetic Checklist: ,, timeout performed, Correct Patient, Correct Site, Correct Laterality, Correct Procedure, Correct Position, site marked, Risks and benefits discussed,  Surgical consent,  Pre-op evaluation,  At surgeon's request and post-op pain management  Laterality: Left  Prep: chloraprep       Needles:  Injection technique: Single-shot  Needle Type: Stimiplex          Additional Needles:  Procedures: ultrasound guided (picture in chart) Femoral nerve block Narrative:  Start time: 05/23/2013 1:53 PM End time: 05/23/2013 2:03 PM Injection made incrementally with aspirations every 5 mL.  Performed by: Personally   Additional Notes: Risks, benefits and alternative to block explained extensively.  Patient tolerated procedure well, without complications.  Supraclavicular block

## 2013-05-23 NOTE — Progress Notes (Signed)
PATIENT ID: Edwin Shaffer  MRN: [id number]  DOB/AGE:  01/05/1946 / 67 y.o.    Procedure(s) (LRB): EXCISIONAL TOTAL KNEE ARTHROPLASTY WITH ANTIBIOTIC SPACERS Left (Left)    PROGRESS NOTE Subjective: Patient is alert, oriented, no Nausea, no Vomiting, yes passing gas, Pt is NPO with anticipation of surgery this afternoon. Denies SOB, Chest or Calf Pain. Using Incentive Spirometer, PAS in place.  Patient reports pain as mild  .    Objective: Vital signs in last 24 hours: Filed Vitals:   05/22/13 1757 05/22/13 1800 05/22/13 2003 05/23/13 0535  BP: 163/79  149/79 178/93  Pulse: 95  87 96  Temp: 98.4 F (36.9 C)  98.2 F (36.8 C) 98.5 F (36.9 C)  TempSrc: Oral   Oral  Resp: 18  18 18  Height:  6' 2.41" (1.89 m)    Weight:  126.1 kg (278 lb)    SpO2: 96%  99% 100%      Intake/Output from previous day: I/O last 3 completed shifts: In: 790 [P.O.:240; IV Piggyback:550] Out: -    Intake/Output this shift:     LABORATORY DATA:  Recent Labs  05/22/13 2113 05/22/13 2121 05/23/13 0636  WBC 11.2*  --   --   HGB 9.7*  --   --   HCT 28.4*  --   --   PLT 470*  --   --   NA 138  --   --   K 3.4*  --   --   CL 101  --   --   CO2 27  --   --   BUN 18  --   --   CREATININE 0.92  --   --   GLUCOSE 144*  --   --   GLUCAP  --  145* 128*  INR 1.14  --   --   CALCIUM 8.7  --   --    CRP at 4.1 Sed rate at 90  Examination: Neurologically intact ABD soft Neurovascular intact Sensation intact distally Intact pulses distally Dorsiflexion/Plantar flexion intact Incision: moderate drainage}  Assessment:     Procedure(s) (LRB): EXCISIONAL TOTAL KNEE ARTHROPLASTY WITH ANTIBIOTIC SPACERS Left (Left) ADDITIONAL DIAGNOSIS:  Diabetes and Hypertension  Plan: PT/OT WBAT, CPM 5/hrs day until ROM 0-90 degrees, then D/C CPM DVT Prophylaxis:  SCDx72hrs, ASA 325 mg BID x 2 weeks post op DISCHARGE PLAN: Skilled Nursing Facility/Rehab DISCHARGE NEEDS: IV Antibiotics  Excisional Left  total knee arthroplasty with Antibiotic spacer Continue with IV van, rifampin and Iv cefepime  Consult Infectious Disease      Blanton Kardell R 05/23/2013, 7:50 AM   

## 2013-05-23 NOTE — Transfer of Care (Signed)
Immediate Anesthesia Transfer of Care Note  Patient: Edwin Shaffer  Procedure(s) Performed: Procedure(s): EXCISIONAL TOTAL KNEE ARTHROPLASTY WITH ANTIBIOTIC SPACERS Left (Left)  Patient Location: PACU  Anesthesia Type:General  Level of Consciousness: awake, alert , oriented and patient cooperative  Airway & Oxygen Therapy: Patient Spontanous Breathing and Patient connected to nasal cannula oxygen  Post-op Assessment: Report given to PACU RN, Post -op Vital signs reviewed and stable and Patient moving all extremities  Post vital signs: Reviewed and stable  Complications: No apparent anesthesia complications

## 2013-05-23 NOTE — Op Note (Signed)
PATIENT ID:      Edwin Shaffer  MRN:     [id number] DOB/AGE:    1946/03/23 / 67 y.o.       OPERATIVE REPORT    DATE OF PROCEDURE:  05/23/2013       PREOPERATIVE DIAGNOSIS:   Left Total Joint Infection      Estimated body mass index is 35.3 kg/(m^2) as calculated from the following:   Height as of this encounter: 6' 2.41" (1.89 m).   Weight as of this encounter: 126.1 kg (278 lb).                                                        POSTOPERATIVE DIAGNOSIS:   Left Total Joint Infection                                                                      PROCEDURE: Radical irrigation and debridement of infected total knee arthroplasty with removal of implants and placement of antibiotic impregnated PMMA spacer     SURGEON: Edwin Shaffer    ASSISTANT:   Edwin Shaffer  (present throughout entire procedure and necessary for timely completion of the procedure)  ANESTHESIA: GET  EBL: 200 cc   DRAINS: foley, 2 medium hemovac in knee   TOURNIQUET TIME:   COMPLICATIONS:  None     SPECIMENS: Infected Synovial fluid and tissue for Gram stain and culture.   INDICATIONS FOR PROCEDURE: Patient is status post total knee arthroplasty that has become infected, undergone radical irrigation and debridement with poly-exchange 3 weeks ago. All cultures from before the first irrigation and debridement and at that surgery have been negative he is been treated with vancomycin, Maxipime, and rifampin, and now the infection has recurred.  Purulent fluid, and clotted blood has drained from the distal aspect of the wound, he was admitted to the hospital last night with a sedimentation rate of 90.Marland Kitchen  Previous cultures x3 have been negative, in order to decrease morbidity and prevent mortality we have recommended and the patient has consented to removal of implants, radical irrigation and debridement, and placement of an antibiotic impregnated PMMA spacer.  DESCRIPTION OF PROCEDURE: The patient  identified by armband, received  And IV antibiotics, in the holding area at [facility]. Patient taken to the operating room, appropriate anesthetic  monitors were attached General endotracheal anesthesia induced with  the patient in supine position, Foley catheter was inserted. Tourniquet  applied high to the operative thigh. Lateral post and foot positioner  applied to the table, the lower extremity was then prepped and draped  in usual sterile fashion from the ankle to the tourniquet. Time-out procedure was performed. The limb was kept elevated during the prep to drain blood without compressing the infected region.  The tourniquet was then inflated to 350 mmHg. We began the operation by making the anterior midline incision starting at handbreadth above the patella going over the patella 1 cm medial to and  6 cm distal to the tibial tubercle. Small bleeders in the skin and the  subcutaneous tissue identified and cauterized. Transverse retinaculum was incised and reflected medially and a medial parapatellar arthrotomy was accomplished. We immediately encountered dark clotted blood and inflamed synovium, once again radical synovectomy was accomplished sharply using a 10 blade, electrocautery, and rongeurs. Because of the inflammation of the patella could not be fully everted even after we took down approximately one half of the insertion. We did manage to sublux the patella laterally to the femur. We then set about sharply excising all inflamed synovium that was visible with the patella supple and the knee flexed.  Polyethylene bearing was then removed sharply with an osteotome.  With the knee hyperflexed.  We then used the thin osteotomes underneath the anterior chamfer, and distal cuts of the femoral component to remove it from the distal femur.  Curettes and rongeurs were then used to remove bits of bone cement that were still embedded in the femur. Overall the condition of the bone of the distal  femur and proximal tibia was relatively good.  With the knee hyperflexed and the tibia externally rotated, we then removed inflamed tissue from around the tibial tray, and using thin osteotomes and an oscillating saw disrupted the interface between the tibial implant and the tibial plateaus.  A mallet and a punch from the [facility] cement revision set were then used to extract the tibial component. Again, curettes, osteotomes, and rongeurs were used to extract cement from the tibial canal.  We also removed inflamed posterior synovium now that the implants were out.  The wound was then thoroughly irrigated out with normal saline solution, and all surfaces dried with sponges. With the tibial and femoral implants out we were able to evert the patella, and struck the interface with osteotomes and an anterior cruciate ligament saw removing the component. Again small Rogers and curettes were used to remove cement from the patella and the medial one quarter and show the patella was noted to be fragmented and removed. This left about 7 nights of the width of the patella intact. A quadruple batch of DePuy HV cement was then mixed with 4 1.2 g vials of tobramycin and 4 1 g vials of tobramycin.With traction and 15 flexion applied to the knee.  A cement spacer was custom fit between the femur and the tibia, with a small spacer applied to the patella.  With the knee flexed 15 the cement was allowed to cure and kept cool with pulse lavage.  After the cement had cured, 2 large bore Hemovac drains were placed in the wound. We then closed in layers with running 0 Vicryl in the capsule, 2-0 undyed Vicryl in the subcutaneous tissue and skin staples. A dressing of Xeroform, 4 x 4 dressings, sponges, web roll, Ace wrap and a knee immobilizer were then placed. The patient was then awakened, extubated, and taken to recovery room without difficulty.   Edwin Shaffer 05/23/2013, 6:23 PM

## 2013-05-23 NOTE — H&P (View-Only) (Signed)
PATIENT ID: Edwin Shaffer  MRN: [id number]  DOB/AGE:  65-Jul-1947 / 65 y.o.    Procedure(s) (LRB): EXCISIONAL TOTAL KNEE ARTHROPLASTY WITH ANTIBIOTIC SPACERS Left (Left)    PROGRESS NOTE Subjective: Patient is alert, oriented, no Nausea, no Vomiting, yes passing gas, Pt is NPO with anticipation of surgery this afternoon. Denies SOB, Chest or Calf Pain. Using Incentive Spirometer, PAS in place.  Patient reports pain as mild  .    Objective: Vital signs in last 24 hours: Filed Vitals:   05/22/13 1757 05/22/13 1800 05/22/13 2003 05/23/13 0535  BP: 163/79  149/79 178/93  Pulse: 95  87 96  Temp: 98.4 F (36.9 C)  98.2 F (36.8 C) 98.5 F (36.9 C)  TempSrc: Oral   Oral  Resp: 18  18 18   Height:  6' 2.41" (1.89 m)    Weight:  126.1 kg (278 lb)    SpO2: 96%  99% 100%      Intake/Output from previous day: I/O last 3 completed shifts: In: 790 [P.O.:240; IV Piggyback:550] Out: -    Intake/Output this shift:     LABORATORY DATA:  Recent Labs  05/22/13 2113 05/22/13 2121 05/23/13 0636  WBC 11.2*  --   --   HGB 9.7*  --   --   HCT 28.4*  --   --   PLT 470*  --   --   NA 138  --   --   K 3.4*  --   --   CL 101  --   --   CO2 27  --   --   BUN 18  --   --   CREATININE 0.92  --   --   GLUCOSE 144*  --   --   GLUCAP  --  145* 128*  INR 1.14  --   --   CALCIUM 8.7  --   --    CRP at 4.1 Sed rate at 90  Examination: Neurologically intact ABD soft Neurovascular intact Sensation intact distally Intact pulses distally Dorsiflexion/Plantar flexion intact Incision: moderate drainage}  Assessment:     Procedure(s) (LRB): EXCISIONAL TOTAL KNEE ARTHROPLASTY WITH ANTIBIOTIC SPACERS Left (Left) ADDITIONAL DIAGNOSIS:  Diabetes and Hypertension  Plan: PT/OT WBAT, CPM 5/hrs day until ROM 0-90 degrees, then D/C CPM DVT Prophylaxis:  SCDx72hrs, ASA 325 mg BID x 2 weeks post op DISCHARGE PLAN: Skilled Nursing Facility/Rehab DISCHARGE NEEDS: IV Antibiotics  Excisional Left  total knee arthroplasty with Antibiotic spacer Continue with IV van, rifampin and Iv cefepime  Consult Infectious Disease      Edwin Shaffer 05/23/2013, 7:50 AM

## 2013-05-23 NOTE — Interval H&P Note (Signed)
History and Physical Interval Note:  05/23/2013 2:03 PM  Edwin Shaffer  has presented today for surgery, with the diagnosis of Left Total Joint Infection  The various methods of treatment have been discussed with the patient and family. After consideration of risks, benefits and other options for treatment, the patient has consented to  Procedure(s): EXCISIONAL TOTAL KNEE ARTHROPLASTY WITH ANTIBIOTIC SPACERS Left (Left) as a surgical intervention .  The patient's history has been reviewed, patient examined, no change in status, stable for surgery.  I have reviewed the patient's chart and labs.  Questions were answered to the patient's satisfaction.     [name]

## 2013-05-23 NOTE — Progress Notes (Signed)
Nutrition Brief Note  Patient identified on the Malnutrition Screening Tool (MST) Report  Weight hx reviewed with pt. No recent weight changes. Nutrition-focused physical exam WNL.  Wt Readings from Last 15 Encounters:  05/22/13 278 lb (126.1 kg)  05/22/13 278 lb (126.1 kg)  05/10/13 278 lb (126.1 kg)  05/10/13 278 lb (126.1 kg)  05/10/13 276 lb (125.193 kg)  04/26/13 278 lb (126.1 kg)  04/24/13 278 lb 3.5 oz (126.2 kg)  04/24/13 278 lb 3.5 oz (126.2 kg)  04/17/13 278 lb 4.8 oz (126.236 kg)    Body mass index is 35.3 kg/(m^2). Patient meets criteria for Obesity Class II based on current BMI.   Current diet order is NPO for pending knee surgery. Labs and medications reviewed.   No nutrition interventions warranted at this time. If nutrition issues arise, please consult RD.   Kendell Bane RD, LDN, CNSC [phone] Pager [phone] After Hours Pager

## 2013-05-23 NOTE — Anesthesia Preprocedure Evaluation (Addendum)
Anesthesia Evaluation  Patient identified by MRN, date of birth, ID band Patient awake    Reviewed: Allergy & Precautions, H&P , Patient's Chart, lab work & pertinent test results, reviewed documented beta blocker date and time   History of Anesthesia Complications Negative for: history of anesthetic complications  Airway Mallampati: II TM Distance: >3 FB Neck ROM: full    Dental no notable dental hx.    Pulmonary neg pulmonary ROS,  breath sounds clear to auscultation  Pulmonary exam normal       Cardiovascular Exercise Tolerance: Good hypertension, negative cardio ROS  Rhythm:regular Rate:Normal     Neuro/Psych negative neurological ROS  negative psych ROS   GI/Hepatic negative GI ROS, Neg liver ROS,   Endo/Other  negative endocrine ROSdiabetesMorbid obesity  Renal/GU negative Renal ROS     Musculoskeletal   Abdominal   Peds  Hematology negative hematology ROS (+)   Anesthesia Other Findings Vocal cord cancer  Reproductive/Obstetrics negative OB ROS                          Anesthesia Physical Anesthesia Plan  ASA: III  Anesthesia Plan: General ETT   Post-op Pain Management:    Induction:   Airway Management Planned:   Additional Equipment:   Intra-op Plan:   Post-operative Plan:   Informed Consent: I have reviewed the patients History and Physical, chart, labs and discussed the procedure including the risks, benefits and alternatives for the proposed anesthesia with the patient or authorized representative who has indicated his/her understanding and acceptance.   Dental Advisory Given  Plan Discussed with: CRNA and Surgeon  Anesthesia Plan Comments:        Anesthesia Quick Evaluation

## 2013-05-23 NOTE — Anesthesia Postprocedure Evaluation (Signed)
Anesthesia Post Note  Patient: Edwin Shaffer  Procedure(s) Performed: Procedure(s) (LRB): EXCISIONAL TOTAL KNEE ARTHROPLASTY WITH ANTIBIOTIC SPACERS Left (Left)  Anesthesia type: General  Patient location: PACU  Post pain: Pain level controlled and Adequate analgesia  Post assessment: Post-op Vital signs reviewed, Patient's Cardiovascular Status Stable, Respiratory Function Stable, Patent Airway and Pain level controlled  Last Vitals:  Filed Vitals:   05/23/13 1900  BP: 142/80  Pulse: 84  Temp:   Resp: 16    Post vital signs: Reviewed and stable  Level of consciousness: awake, alert  and oriented  Complications: No apparent anesthesia complications

## 2013-05-23 NOTE — Progress Notes (Signed)
Orthopedic Tech Progress Note Patient Details:  Edwin Shaffer 04/23/1946 [id number]  Ortho Devices Type of Ortho Device: Knee Immobilizer Ortho Device/Splint Location: lle Ortho Device/Splint Interventions: Application Viewed order from rn order list  Nikki Dom 05/23/2013, 7:05 PM

## 2013-05-24 ENCOUNTER — Encounter (HOSPITAL_COMMUNITY): Payer: Self-pay | Admitting: Orthopedic Surgery

## 2013-05-24 DIAGNOSIS — M869 Osteomyelitis, unspecified: Secondary | ICD-10-CM

## 2013-05-24 DIAGNOSIS — M009 Pyogenic arthritis, unspecified: Secondary | ICD-10-CM

## 2013-05-24 LAB — CBC
HCT: 25.6 % — ABNORMAL LOW (ref 39.0–52.0)
Hemoglobin: 8.5 g/dL — ABNORMAL LOW (ref 13.0–17.0)
MCV: 88.3 fL (ref 78.0–100.0)
RBC: 2.9 MIL/uL — ABNORMAL LOW (ref 4.22–5.81)
RDW: 13.5 % (ref 11.5–15.5)
WBC: 11.1 10*3/uL — ABNORMAL HIGH (ref 4.0–10.5)

## 2013-05-24 LAB — GLUCOSE, CAPILLARY
Glucose-Capillary: 142 mg/dL — ABNORMAL HIGH (ref 70–99)
Glucose-Capillary: 136 mg/dL — ABNORMAL HIGH (ref 70–99)
Glucose-Capillary: 170 mg/dL — ABNORMAL HIGH (ref 70–99)
Glucose-Capillary: 128 mg/dL — ABNORMAL HIGH (ref 70–99)

## 2013-05-24 LAB — HEMOGLOBIN A1C: Mean Plasma Glucose: 151 mg/dL — ABNORMAL HIGH (ref <117)

## 2013-05-24 MED ORDER — SODIUM CHLORIDE 0.9 % IJ SOLN
10.0000 mL | INTRAMUSCULAR | Status: DC | PRN
  Administered 2013-05-25: 10 mL
  Administered 2013-05-27: 20 mL

## 2013-05-24 MED ORDER — HYDROMORPHONE HCL PF 1 MG/ML IJ SOLN
1.0000 mg | INTRAMUSCULAR | Status: DC | PRN
  Administered 2013-05-24 – 2013-05-29 (×24): 1 mg via INTRAVENOUS
  Filled 2013-05-24 (×24): qty 1

## 2013-05-24 MED ORDER — SODIUM CHLORIDE 0.9 % IJ SOLN: 10.0000 mL | Freq: Two times a day (BID) | INTRAMUSCULAR | Status: DC

## 2013-05-24 MED ORDER — TAMSULOSIN HCL 0.4 MG PO CAPS
0.4000 mg | ORAL_CAPSULE | Freq: Every day | ORAL | Status: DC
  Administered 2013-05-24 – 2013-05-30 (×7): 0.4 mg via ORAL
  Filled 2013-05-24 (×8): qty 1

## 2013-05-24 MED ORDER — DAPAGLIFLOZIN PROPANEDIOL 5 MG PO TABS
5.0000 mg | ORAL_TABLET | Freq: Every day | ORAL | Status: DC
  Administered 2013-05-27 – 2013-05-30 (×4): 5 mg via ORAL

## 2013-05-24 MED ORDER — DILTIAZEM HCL ER BEADS 300 MG PO CP24
300.0000 mg | ORAL_CAPSULE | Freq: Every day | ORAL | Status: DC
  Administered 2013-05-24 – 2013-05-30 (×7): 300 mg via ORAL
  Filled 2013-05-24 (×7): qty 1

## 2013-05-24 MED ORDER — LISINOPRIL 20 MG PO TABS
20.0000 mg | ORAL_TABLET | Freq: Every day | ORAL | Status: DC
  Administered 2013-05-24 – 2013-05-30 (×7): 20 mg via ORAL
  Filled 2013-05-24 (×7): qty 1

## 2013-05-24 NOTE — Evaluation (Signed)
Occupational Therapy Evaluation Patient Details Name: Edwin Shaffer MRN: [id number] DOB: 1946-03-15 Today's Date: 05/24/2013 Time: 1129-1150 OT Time Calculation (min): 21 min  OT Assessment / Plan / Recommendation History of present illness Pt with multiple recent admissions due to infected Lt knee   Clinical Impression   Pt demos decline in function with ADLs and ADL mobility safety and would benefit from acute OT services to address impairments to increase level of function and safety. PT plans to d/c to SNF for short term rehab after acute care stay    OT Assessment  Patient needs continued OT Services    Follow Up Recommendations  SNF;Supervision/Assistance - 24 hour    Barriers to Discharge Decreased caregiver support. Pt  Planning to d/c to SNF for short term rehab    Equipment Recommendations  None recommended by OT;Other (comment) (TBD)    Recommendations for Other Services    Frequency  Min 2X/week    Precautions / Restrictions Precautions Precautions: Knee;Fall Precaution Comments: No ROM on Lt knee; reviewed wear schedule and precautions with pt extensively Required Braces or Orthoses: Knee Immobilizer - Left Knee Immobilizer - Left: On at all times Restrictions Weight Bearing Restrictions: Yes LLE Weight Bearing: Non weight bearing   Pertinent Vitals/Pain 6/10    ADL  Grooming: Performed;Supervision/safety;Set up Where Assessed - Grooming: Supported sitting Upper Body Bathing: Set up;Supervision/safety;Simulated Lower Body Bathing: +1 Total assistance Upper Body Dressing: Performed;Set up;Supervision/safety Lower Body Dressing: +1 Total assistance Toilet Transfer: Moderate assistance;Performed Toilet Transfer Method: Sit to stand;Stand pivot Acupuncturist: Bedside commode Toileting - Clothing Manipulation and Hygiene: +1 Total assistance Where Assessed - Glass blower/designer Manipulation and Hygiene: Standing Tub/Shower Transfer Method: Not  assessed Transfers/Ambulation Related to ADLs:  max cues to perform SPT bed to chair; A  to maintain balance and perform pivotal step while maintaining NWB status on L LE; cues to control descent to chair ADL Comments: Pt familiar with ADL A/E, but states that he does not have it at home. Reviewed A/E with pt    OT Diagnosis: Acute pain  OT Problem List: Decreased range of motion;Decreased activity tolerance;Impaired balance (sitting and/or standing);Decreased cognition;Decreased knowledge of use of DME or AE;Pain;Decreased safety awareness OT Treatment Interventions: Self-care/ADL training;DME and/or AE instruction;Therapeutic activities;Patient/family education;Balance training;Therapeutic exercise;Neuromuscular education   OT Goals(Current goals can be found in the care plan section) Acute Rehab OT Goals Patient Stated Goal: " For this knee to finally get better " OT Goal Formulation: With patient Time For Goal Achievement: 05/31/13 Potential to Achieve Goals: Good ADL Goals Pt Will Perform Grooming: with mod assist;with min assist;standing Pt Will Perform Lower Body Bathing: with max assist;with mod assist Pt Will Transfer to Toilet: with min assist;bedside commode;regular height toilet;grab bars Pt Will Perform Toileting - Clothing Manipulation and hygiene: with max assist;with mod assist  Visit Information  Last OT Received On: 05/24/13 Assistance Needed: +2 History of Present Illness: Pt with multiple recent admissions due to infected Lt knee       Prior Functioning     Home Living Family/patient expects to be discharged to:: Skilled nursing facility Living Arrangements: Spouse/significant other Available Help at Discharge: Family;Available 24 hours/day Type of Home: House Home Access: Stairs to enter [employer] of Steps: 2 Entrance Stairs-Rails: None Home Layout: One level Home Equipment: Walker - 2 wheels;Bedside commode Additional Comments: has walk in  shower; was recently D/C home from [facility] and Mod I per pt; pt with difficulty answering questions; becoming agitated at  PLOF questions and home setup questions  Prior Function Level of Independence: Independent with assistive device(s) Comments: prior to returning home after original Lt TKA  Communication Communication: No difficulties Dominant Hand: Left         Vision/Perception Vision - History Baseline Vision: Wears glasses only for reading Patient Visual Report: No change from baseline Perception Perception: Within Functional Limits   Cognition  Cognition Arousal/Alertness: Awake/alert Behavior During Therapy: WFL for tasks assessed/performed Overall Cognitive Status: Within Functional Limits for tasks assessed General Comments: Pt requires one step commands at all times to stay on task; easily confused with sequencing     Extremity/Trunk Assessment Upper Extremity Assessment Upper Extremity Assessment: Overall WFL for tasks assessed Lower Extremity Assessment Lower Extremity Assessment: Defer to PT evaluation Cervical / Trunk Assessment Cervical / Trunk Assessment: Normal     Mobility Bed Mobility Bed Mobility: Not assessed Transfers Transfers: Sit to Stand;Stand to Sit Sit to Stand: 3: Mod assist;From elevated surface;From chair/3-in-1;With upper extremity assist;With armrests Stand to Sit: 3: Mod assist;To chair/3-in-1;With armrests;With upper extremity assist Details for Transfer Assistance: (A) and max cues to perform SPT bed to chair; (A) to maintain balance and perform pivotal step while maintaining NWB status on Lt LE; cues to control descent to chair; pt tends to flop into chair      Exercise     Balance Balance Balance Assessed: Yes Static Sitting Balance Static Sitting - Balance Support: Bilateral upper extremity supported (Rt LE supported ) Static Sitting - Level of Assistance: 5: Stand by assistance Static Standing Balance Static Standing -  Balance Support: Bilateral upper extremity supported;During functional activity Static Standing - Level of Assistance: 3: Mod assist   End of Session OT - End of Session Equipment Utilized During Treatment: Gait belt;Rolling walker;Left knee immobilizer;Other (comment) (BSC) Activity Tolerance: Patient limited by pain Patient left: in chair;with call bell/phone within reach CPM Left Knee CPM Left Knee: Off  GO     Margaretmary Eddy [facility] 05/24/2013, 3:00 PM

## 2013-05-24 NOTE — Progress Notes (Signed)
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 Patient reports the pain last was excruciating and the Dilaudid had to be bumped up from every 4 hours to every 2 hours it is now controlled. He denies any nausea or vomiting. He is able to move his foot up and down without difficulty. We reviewed the procedure were we did remove all of his total knee implants and placed a PMMA spacer with vancomycin and tobramycin over 4 g of each. I also reviewed the moderate tissue damage it was noted to the patellar tendon, the bone quality was overall good, and the fact we stil actual organism on any of the cultures from our office for surgery.  He is maintained on vancomycin and Maxipime. I discontinued the right phantom since he no longer has any foreign material in his knee except for the spacer block which was loaded with antibiotics.   Objective: Vital signs in last 24 hours: Temp:  [97.5 F (36.4 C)-99.5 F (37.5 C)] 98.3 F (36.8 C) (09/25 0555) Pulse Rate:  [78-96] 96 (09/25 0555) Resp:  [10-18] 18 (09/25 0555) BP: (142-190)/(75-99) 154/82 mmHg (09/25 0555) SpO2:  [96 %-100 %] 99 % (09/25 0555)  Intake/Output from previous day: 09/24 0701 - 09/25 0700 In: 3320 [P.O.:720; I.V.:2500] Out: 2325 [Urine:1550; Drains:675; Blood:100] Intake/Output this shift:     Recent Labs  05/22/13 2113 05/23/13 2050 05/24/13 0549  HGB 9.7* 8.9* 8.5*    Recent Labs  05/22/13 2113 05/23/13 2050 05/24/13 0549  WBC 11.2*  --  11.1*  RBC 3.24*  --  2.90*  HCT 28.4* 26.1* 25.6*  PLT 470*  --  435*    Recent Labs  05/22/13 2113  NA 138  K 3.4*  CL 101  CO2 27  BUN 18  CREATININE 0.92  GLUCOSE 144*  CALCIUM 8.7    Recent Labs  05/22/13 2113  INR 1.14    Exam: The drains have put out 675 cc, they're both superficial and deep relatively quiet this morning. I did take his dressing  down to examine his skin which is at risk from the infection, it looks good this morning the staple line was intact no evidence of necrosis at this time good sensation of the foot he pump the scope and down without difficulty going forward we'll keep him in a short knee immobilizer and I've cautioned him to not to attempt to flex or or straighten his knee since the antibiotic spacer will hold it at about 15 flexion.   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 He'll remain on IV antibiotics probably for at least 6 weeks per infectious diseases and and oral antibiotics after that. Unfortunately we do not have an identified organism. Regarding physical therapy there should be no attempt to a straighten or bend the knee. He'll be maintained in a knee immobilizer when he is up. If he does well for 3-6 months when I attempted reimplantation of the prosthesis. That'll be approached with caution.  Nishant Schrecengost J 05/24/2013, 9:23 AM

## 2013-05-24 NOTE — Progress Notes (Signed)
Patient unable to spontaneously void. Bladder scan preformed and showed 480cc. In and out cath preformed at 1713 and recorded 450cc. Will continue to monitor.

## 2013-05-24 NOTE — Evaluation (Signed)
Physical Therapy Evaluation Patient Details Name: Edwin Shaffer MRN: [id number] DOB: Dec 31, 1945 Today's Date: 05/24/2013 Time: 4098-1191 PT Time Calculation (min): 21 min  PT Assessment / Plan / Recommendation History of Present Illness  Pt with multiple recent admissions due to infected Lt knee. Pt was recently D/C home with wife.   Clinical Impression  Patient is s/p POD #1 excisional TKA with antibiotic spacer surgery resulting in functional limitations due to the deficits listed below (see PT Problem List). Pt with limited mobility due to NWB status in Lt LE. Patient will benefit from skilled PT to increase their independence and safety with mobility to allow discharge to the venue listed below.      PT Assessment  Patient needs continued PT services    Follow Up Recommendations  SNF;Supervision/Assistance - 24 hour    Does the patient have the potential to tolerate intense rehabilitation      Barriers to Discharge Inaccessible home environment;Decreased caregiver support wife unable to provide (A) pt will need due to NWB status     Equipment Recommendations  Other (comment) (TBD at SNF)    Recommendations for Other Services OT consult   Frequency Min 5X/week    Precautions / Restrictions Precautions Precautions: Knee;Fall Precaution Comments: No ROM on Lt knee; reviewed wear schedule and precautions with pt extensively Required Braces or Orthoses: Knee Immobilizer - Left Knee Immobilizer - Left: On at all times Restrictions Weight Bearing Restrictions: Yes LLE Weight Bearing: Non weight bearing   Pertinent Vitals/Pain 6/10; pt premedicated and patient repositioned for comfort in chair       Mobility  Bed Mobility Bed Mobility: Supine to Sit;Sitting - Scoot to Edge of Bed Supine to Sit: 4: Min assist;HOB elevated;With rails Sitting - Scoot to Edge of Bed: 4: Min guard;With rail Details for Bed Mobility Assistance: (A) to bring Lt LE to/off EOB and max cues for  hand placement and sequencing; pt requires incr time due to pain  Transfers Transfers: Sit to Stand;Stand to Sit;Stand Pivot Transfers Sit to Stand: 3: Mod assist;From bed;From elevated surface Stand to Sit: 3: Mod assist;To chair/3-in-1;With armrests;With upper extremity assist Stand Pivot Transfers: 3: Mod assist;From elevated surface;With armrests Details for Transfer Assistance: (A) and max cues to perform SPT bed to chair; (A) to maintain balance and perform pivotal step while maintaining NWB status on Lt LE; cues to control descent to chair; pt tends to flop into chair  Ambulation/Gait Ambulation/Gait Assistance: Not tested (comment) Stairs: No Wheelchair Mobility Wheelchair Mobility: No         PT Diagnosis: Difficulty walking;Acute pain  PT Problem List: Decreased strength;Decreased range of motion;Decreased activity tolerance;Decreased balance;Decreased mobility;Decreased knowledge of use of DME;Decreased safety awareness;Decreased knowledge of precautions;Pain PT Treatment Interventions: DME instruction;Gait training;Functional mobility training;Therapeutic activities;Therapeutic exercise;Balance training;Neuromuscular re-education;Patient/family education;Wheelchair mobility training     PT Goals(Current goals can be found in the care plan section) Acute Rehab PT Goals Patient Stated Goal: to not have my knee cut on anymore PT Goal Formulation: With patient Time For Goal Achievement: 05/31/13 Potential to Achieve Goals: Good  Visit Information  Last PT Received On: 05/24/13 Assistance Needed: +2 (for amb due to NWB status ) History of Present Illness: Pt with multiple recent admissions due to infected Lt knee       Prior Functioning  Home Living Family/patient expects to be discharged to:: Skilled nursing facility Living Arrangements: Spouse/significant other Available Help at Discharge: Family;Available 24 hours/day Type of Home: House Home Access: Stairs to  enter Entrance Stairs-Number of Steps: 2 Entrance Stairs-Rails: None Home Layout: One level Home Equipment: Walker - 2 wheels;Bedside commode Prior Function Level of Independence: Independent with assistive device(s) Comments: prior to returning home after original Lt TKA  Communication Communication: No difficulties Dominant Hand: Left    Cognition  Cognition Arousal/Alertness: Awake/alert Behavior During Therapy: WFL for tasks assessed/performed Overall Cognitive Status: Within Functional Limits for tasks assessed General Comments: Pt requires one step commands at all times to stay on task; easily confused with sequencing     Extremity/Trunk Assessment Upper Extremity Assessment Upper Extremity Assessment: Defer to OT evaluation Lower Extremity Assessment Lower Extremity Assessment: LLE deficits/detail LLE: Unable to fully assess due to pain;Unable to fully assess due to immobilization LLE Sensation:  (WFL to light touch on Lt foot) Cervical / Trunk Assessment Cervical / Trunk Assessment: Normal   Balance Balance Balance Assessed: Yes Static Sitting Balance Static Sitting - Balance Support: Bilateral upper extremity supported (Rt LE supported ) Static Sitting - Level of Assistance: 5: Stand by assistance Static Standing Balance Static Standing - Balance Support: Bilateral upper extremity supported;During functional activity Static Standing - Level of Assistance: 3: Mod assist  End of Session PT - End of Session Equipment Utilized During Treatment: Gait belt;Left knee immobilizer Activity Tolerance: Patient tolerated treatment well Patient left: in chair;with call bell/phone within reach Nurse Communication: Mobility status  GP     Donnamarie Poag N,PT [phone] 05/24/2013, 11:26 AM

## 2013-05-24 NOTE — Progress Notes (Signed)
Clinical Social Work Department  BRIEF PSYCHOSOCIAL ASSESSMENT  Patient: Edwin Shaffer  Account [id number]  Admit date: 05/23/13 Clinical Social Worker Sabino Niemann, MSW Date/Time: 05/24/2013 3:00 PM Referred by: Physician Date Referred: 05/24/2013 Referred for   SNF Placement   Other Referral:  Interview type: Patient  Other interview type: PSYCHOSOCIAL DATA  Living Status: Wife Admitted from facility:  Level of care:  Primary support name: Edwin Shaffer Primary support relationship to patient: Wife Degree of support available:  Strong and vested  CURRENT CONCERNS  Current Concerns   Post-Acute Placement   Other Concerns:  SOCIAL WORK ASSESSMENT / PLAN  CSW met with pt re: PT recommendation for SNF.   Pt lives with his spouse  CSW explained placement process and answered questions.   Pt reports [facility]  as his preference    CSW completed FL2 and initiated SNF search.     Assessment/plan status: Information/Referral to [facility]  Other assessment/ plan:  Information/referral to community resources:  SNF     PATIENT'S/FAMILY'S RESPONSE TO PLAN OF CARE:  Pt  reports he is agreeable to [facility] in order to increase strength and independence with mobility prior to returning home  Pt verbalized understanding of placement process and appreciation for CSW assist.   Sabino Niemann, MSW [phone]
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
## 2013-05-24 NOTE — Progress Notes (Signed)
At 0040 pt c/o severe, excrutiating pain stating IV Dilaudid q4h, PO oxy IR and robaxin not lasting. Jason Coop, PA on call notified. Order received to increase 1mg  IV Dilaudid to q2h. Pain has now decreased with this change. Requiring pain meds given as soon as due. Pt currently asleep, resting comfortably. Immobilizer on and ice pack in place. Will continue to monitor.

## 2013-05-25 LAB — CBC
MCHC: 33.3 g/dL (ref 30.0–36.0)
Platelets: 359 10*3/uL (ref 150–400)
RBC: 2.51 MIL/uL — ABNORMAL LOW (ref 4.22–5.81)
WBC: 10.5 10*3/uL (ref 4.0–10.5)

## 2013-05-25 LAB — GLUCOSE, CAPILLARY
Glucose-Capillary: 136 mg/dL — ABNORMAL HIGH (ref 70–99)
Glucose-Capillary: 137 mg/dL — ABNORMAL HIGH (ref 70–99)

## 2013-05-25 NOTE — Progress Notes (Signed)
Physical Therapy Treatment Patient Details Name: Edwin Shaffer MRN: [id number] DOB: 09-23-1945 Today's Date: 05/25/2013 Time: 4098-1191 PT Time Calculation (min): 20 min  PT Assessment / Plan / Recommendation  History of Present Illness Pt with multiple recent admissions due to infected Lt knee   PT Comments   Pt with recent transfusion of 1 unit of blood. Was able to incr amb due to updated WBAT status per PA note. Pt is very anxious and fearful of hurting Lt knee. Amb at decr speed and with min (A) to manage RW and maintain balance. Pt to benefit from ST SNF to incr independence and decr caregiver burden. Will cont to f/u with pt while in acute setting.   Follow Up Recommendations  SNF;Supervision/Assistance - 24 hour     Does the patient have the potential to tolerate intense rehabilitation     Barriers to Discharge        Equipment Recommendations  None recommended by PT    Recommendations for Other Services OT consult  Frequency Min 5X/week   Progress towards PT Goals Progress towards PT goals: Progressing toward goals  Plan Current plan remains appropriate    Precautions / Restrictions Precautions Precautions: Knee;Fall Precaution Comments: No ROM on Lt knee; reviewed wear schedule and precautions with pt extensively Required Braces or Orthoses: Knee Immobilizer - Left Knee Immobilizer - Left: On at all times Restrictions Weight Bearing Restrictions: Yes LLE Weight Bearing: Weight bearing as tolerated (Per recent PA note)   Pertinent Vitals/Pain 7/10; pt premedicated; no c/o dizziness or fatigue     Mobility  Bed Mobility Bed Mobility: Supine to Sit;Sitting - Scoot to Edge of Bed;Sit to Supine Supine to Sit: 4: Min assist;HOB elevated;With rails Sitting - Scoot to Edge of Bed: 4: Min guard;With rail Sit to Supine: 4: Min assist;HOB elevated;With rail Details for Bed Mobility Assistance: pt requries (A) to advance Lt LE onto/off EOB with incr time due to pain;  cues for hand placement and sequencing  Transfers Transfers: Sit to Stand;Stand to Sit Sit to Stand: 3: Mod assist;From bed;From elevated surface;With upper extremity assist Stand to Sit: 4: Min assist;To chair/3-in-1;With armrests;With upper extremity assist Details for Transfer Assistance: (A) to achieve upright standing position and maintain balance; pt with lean to Rt to off weight Lt LE due to pain; max cues for hand placement and seqeuencing with RW Ambulation/Gait Ambulation/Gait Assistance: 4: Min assist Ambulation Distance (Feet): 20 Feet Assistive device: Rolling walker Ambulation/Gait Assistance Details: cues for hand placement and gt sequencing; pt with fwd flex posture; pt very anxious and fearful of falling and hurting his Lt knee   Gait Pattern: Step-to pattern;Wide base of support;Trunk flexed Gait velocity: decreased General Gait Details: 2+ (A) to follow up with chair for safety; pt is 278lbs and 6\' 4"  Stairs: No Wheelchair Mobility Wheelchair Mobility: No    Exercises     PT Diagnosis:    PT Problem List:   PT Treatment Interventions:     PT Goals (current goals can now be found in the care plan section) Acute Rehab PT Goals Patient Stated Goal: to get better PT Goal Formulation: With patient Time For Goal Achievement: 05/31/13 Potential to Achieve Goals: Good  Visit Information  Last PT Received On: 05/25/13 Assistance Needed: +2 (for safety to follow with chair ) History of Present Illness: Pt with multiple recent admissions due to infected Lt knee    Subjective Data  Subjective: Pt lying supine; repeated same questions and statements regarding surgery;  agreeable to therapy; "ill do what i can but it hurts really bad"  Patient Stated Goal: to get better   Cognition  Cognition Arousal/Alertness: Awake/alert Behavior During Therapy: WFL for tasks assessed/performed Overall Cognitive Status: Within Functional Limits for tasks assessed General Comments:  Pt requires one step commands at all times to stay on task; easily confused with sequencing     Balance  Balance Balance Assessed: Yes Static Standing Balance Static Standing - Balance Support: Bilateral upper extremity supported;During functional activity Static Standing - Level of Assistance: 4: Min assist Static Standing - Comment/# of Minutes: pt tolerated standing at toilet for ~2 min to use restroom; with min (A) to maintain balance; pt tends to lean anteriorly and requries cues for upright posture and safety   End of Session PT - End of Session Equipment Utilized During Treatment: Gait belt;Left knee immobilizer Activity Tolerance: Patient tolerated treatment well Patient left: in bed;with call bell/phone within reach Nurse Communication: Mobility status   GP     [name], [place] [phone] 05/25/2013, 1:52 PM

## 2013-05-25 NOTE — Progress Notes (Signed)
PATIENT ID: Edwin Shaffer  MRN: [id number]  DOB/AGE:  1946-02-07 / 67 y.o.  2 Days Post-Op Procedure(s) (LRB): EXCISIONAL TOTAL KNEE ARTHROPLASTY WITH ANTIBIOTIC SPACERS Left (Left)    PROGRESS NOTE Subjective: Patient is alert, oriented, no Nausea, no Vomiting, yes passing gas, no Bowel Movement. Taking PO well, pt up eating in bed. Denies SOB, Chest or Calf Pain. Using Incentive Spirometer, PAS in place. Ambulate WBAT with knee immobilizer,  Patient reports pain as mild  .    Objective: Vital signs in last 24 hours: Filed Vitals:   05/24/13 2140 05/25/13 0000 05/25/13 0400 05/25/13 0550  BP: 122/68   152/78  Pulse: 92   92  Temp: 98.9 F (37.2 C)   98.7 F (37.1 C)  TempSrc: Oral   Oral  Resp: 18 18 18 18   Height:      Weight:      SpO2: 98%   99%      Intake/Output from previous day: I/O last 3 completed shifts: In: 2780 [P.O.:1080; I.V.:1500; Other:200] Out: 2880 [Urine:1945; Drains:935]   Intake/Output this shift:     LABORATORY DATA:  Recent Labs  05/22/13 2113  05/24/13 0549  05/24/13 1603 05/24/13 2126 05/25/13 0500 05/25/13 0643  WBC 11.2*  --  11.1*  --   --   --  10.5  --   HGB 9.7*  < > 8.5*  --   --   --  7.4*  --   HCT 28.4*  < > 25.6*  --   --   --  22.2*  --   PLT 470*  --  435*  --   --   --  359  --   NA 138  --   --   --   --   --   --   --   K 3.4*  --   --   --   --   --   --   --   CL 101  --   --   --   --   --   --   --   CO2 27  --   --   --   --   --   --   --   BUN 18  --   --   --   --   --   --   --   CREATININE 0.92  --   --   --   --   --   --   --   GLUCOSE 144*  --   --   --   --   --   --   --   GLUCAP  --   < >  --   < > 170* 128*  --  138*  INR 1.14  --   --   --   --   --   --   --   CALCIUM 8.7  --   --   --   --   --   --   --   < > = values in this interval not displayed.  Examination: Neurologically intact Neurovascular intact Sensation intact distally Intact pulses distally Dorsiflexion/Plantar flexion  intact Incision: moderate drainage No cellulitis present Compartment soft}  Assessment:   2 Days Post-Op Procedure(s) (LRB): EXCISIONAL TOTAL KNEE ARTHROPLASTY WITH ANTIBIOTIC SPACERS Left (Left) - Plan to continue on IV vanc and Maxipime. ADDITIONAL DIAGNOSIS:  Acute Blood Loss Anemia, Diabetes and Hypertension Urinary retention -  Pt had 2 in and out caths last night and finally voided on his own this AM. Acute blood loss anemia - pt given 2 units  Plan: PT/OT WBAT DVT Prophylaxis:  SCDx72hrs, ASA 325 mg BID x 2 weeks DISCHARGE PLAN: Skilled Nursing Facility/Rehab DISCHARGE NEEDS: IV Antibiotics Plan to leave Drains in through weekend     Shantai Tiedeman R 05/25/2013, 7:36 AM

## 2013-05-25 NOTE — Progress Notes (Signed)
Pt has been unable to void adequate amounts of urine.  He has voided 100 cc twice and 50 cc twice.  Bladder scan @ 1250 showed of urine in bladder.  Foley catheter inserted per protocol.  Dr. Turner Daniels was notified, no additional orders received.  Nsg to continue to monitor.

## 2013-05-25 NOTE — Clinical Social Work Placement (Signed)
.  Clinical Social Work Department  CLINICAL SOCIAL WORK PLACEMENT NOTE  Patient: EDREES VALENT Account Number: [id number] Admit date: 05/22/13  Clinical Social Worker: Sabino Niemann LCSWA Date/time: 05/24/13 3:30 PM  Clinical Social Work is seeking post-discharge placement for this patient at the following level of care: SKILLED NURSING (*CSW will update this form in Epic as items are completed)  05/24/13 Patient/family provided with [facility] Department of Clinical Social Work's list of facilities offering this level of care within the geographic area requested by the patient (or if unable, by the patient's family).  9/25/14Patient/family informed of their freedom to choose among providers that offer the needed level of care, that participate in Medicare, Medicaid or managed care program needed by the patient, have an available bed and are willing to accept the patient.  05/24/13 Patient/family informed of [facility]' ownership interest in [facility], as well as of the fact that they are under no obligation to receive care at this facility.  PASARR submitted to EDS on Pre-exisitng  PASARR number received from EDS on  FL2 transmitted to all facilities in geographic area requested by pt/family on 05/24/13  FL2 transmitted to all facilities within larger geographic area on  Patient informed that his/her managed care company has contracts with or will negotiate with certain facilities, including the following:  Patient/family informed of bed offers received: 05/24/13  Patient chooses bed at [facility] Physician recommends and patient chooses bed at  Patient to be transferred to on  Patient to be transferred to facility by  The following physician request were entered in Epic:  Additional Comments:  Sabino Niemann, MSW, LCSWA  [phone]  Signing off

## 2013-05-25 NOTE — Progress Notes (Signed)
Pt only able to spontaneously void 25mL. Bladder scan done at 2345 with result of urine. Pt stated he felt he would be able to void, given extra time to do so. Unable to void, in and out cath done at 0130. amber urine returned. Pt encouraged to continue PO fluid intake. Will continue to monitor.

## 2013-05-26 ENCOUNTER — Encounter (HOSPITAL_COMMUNITY): Payer: Self-pay

## 2013-05-26 LAB — GLUCOSE, CAPILLARY
Glucose-Capillary: 163 mg/dL — ABNORMAL HIGH (ref 70–99)
Glucose-Capillary: 162 mg/dL — ABNORMAL HIGH (ref 70–99)
Glucose-Capillary: 148 mg/dL — ABNORMAL HIGH (ref 70–99)

## 2013-05-26 LAB — TYPE AND SCREEN: Unit division: 0

## 2013-05-26 LAB — CBC
HCT: 26 % — ABNORMAL LOW (ref 39.0–52.0)
Hemoglobin: 8.8 g/dL — ABNORMAL LOW (ref 13.0–17.0)
MCV: 85.2 fL (ref 78.0–100.0)
Platelets: 372 10*3/uL (ref 150–400)
RBC: 3.05 MIL/uL — ABNORMAL LOW (ref 4.22–5.81)
RDW: 15.6 % — ABNORMAL HIGH (ref 11.5–15.5)
WBC: 11.1 10*3/uL — ABNORMAL HIGH (ref 4.0–10.5)

## 2013-05-26 LAB — CREATININE, SERUM
Creatinine, Ser: 0.95 mg/dL (ref 0.50–1.35)
GFR calc Af Amer: >90 mL/min (ref >90)
GFR calc non Af Amer: 85 mL/min — ABNORMAL LOW (ref >90)

## 2013-05-26 MED ORDER — OXYCODONE HCL ER 10 MG PO T12A
10.0000 mg | EXTENDED_RELEASE_TABLET | Freq: Once | ORAL | Status: AC
  Administered 2013-05-26: 10 mg via ORAL
  Filled 2013-05-26: qty 1

## 2013-05-26 NOTE — Progress Notes (Signed)
ANTIBIOTIC CONSULT NOTE - INITIAL  Pharmacy Consult for Vancomycin Indication: Infected L TKA  No Known Allergies  Patient Measurements: Height: 6' 2.41" (189 cm) Weight: 278 lb (126.1 kg) IBW/kg (Calculated) : 83.14  Vital Signs: Temp: 98.6 F (37 C) (09/27 0647) Temp src: Oral (09/27 0647) BP: 173/87 mmHg (09/27 0647) Pulse Rate: 93 (09/27 0647) Intake/Output from previous day: 09/26 0701 - 09/27 0700 In: 4305.8 [P.O.:960; I.V.:1525; Blood:720.8; IV Piggyback:1100] Out: 1610 [Urine:1400; Drains:210] Intake/Output from this shift: Total I/O In: 360 [P.O.:360] Out: 650 [Urine:650]  Labs:  Recent Labs  05/24/13 0549 05/25/13 0500 05/26/13 0510  WBC 11.1* 10.5 11.1*  HGB 8.5* 7.4* 8.8*  PLT 435* 359 372  CREATININE  --   --  0.95   Estimated Creatinine Clearance: 110 ml/min (by C-G formula based on Cr of 0.95). No results found for this basename: VANCOTROUGH, VANCOPEAK, VANCORANDOM, GENTTROUGH, GENTPEAK, GENTRANDOM, TOBRATROUGH, TOBRAPEAK, TOBRARND, AMIKACINPEAK, AMIKACINTROU, AMIKACIN,  in the last 72 hours   Microbiology: Recent Results (from the past 720 hour(s))  ANAEROBIC CULTURE     Status: None   Collection Time    05/11/13  1:44 PM      Result Value Range Status   Specimen Description TISSUE KNEE LEFT   Final   Special Requests LEFT KNEE DEEP TISSUE RECEIVED IN CUP   Final   Gram Stain     Final   Value: NO WBC SEEN     NO ORGANISMS SEEN     Performed at [facility]   Culture     Final   Value: NO ANAEROBES ISOLATED     Performed at [facility]   Report Status 05/16/2013 FINAL   Final  TISSUE CULTURE     Status: None   Collection Time    05/11/13  1:44 PM      Result Value Range Status   Specimen Description TISSUE KNEE LEFT   Final   Special Requests LEFT KNEE DEEP RECEIVED IN CUP   Final   Gram Stain     Final   Value: NO WBC SEEN     NO ORGANISMS SEEN     Performed at [facility]   Culture     Final   Value: NO  GROWTH 3 DAYS     Performed at [facility]   Report Status 05/14/2013 FINAL   Final  ANAEROBIC CULTURE     Status: None   Collection Time    05/11/13  1:48 PM      Result Value Range Status   Specimen Description ABSCESS KNEE LEFT   Final   Special Requests LEFT KNEE DEEP RECEIVED SWAB   Final   Gram Stain     Final   Value: NO WBC SEEN     NO SQUAMOUS EPITHELIAL CELLS SEEN     NO ORGANISMS SEEN     Performed at [facility]   Culture     Final   Value: NO ANAEROBES ISOLATED     Performed at [facility]   Report Status 05/16/2013 FINAL   Final  CULTURE, ROUTINE-ABSCESS     Status: None   Collection Time    05/11/13  1:48 PM      Result Value Range Status   Specimen Description ABSCESS KNEE LEFT   Final   Special Requests LEFT KNEE DEEP RECEIVED SWAB   Final   Gram Stain     Final   Value: NO WBC SEEN  NO SQUAMOUS EPITHELIAL CELLS SEEN     NO ORGANISMS SEEN     Performed at [facility]   Culture     Final   Value: NO GROWTH 3 DAYS     Performed at [facility]   Report Status 05/14/2013 FINAL   Final  SURGICAL PCR SCREEN     Status: None   Collection Time    05/23/13  5:49 AM      Result Value Range Status   MRSA, PCR NEGATIVE  NEGATIVE Final   Staphylococcus aureus NEGATIVE  NEGATIVE Final   Comment:            The Xpert SA Assay (FDA     approved for NASAL specimens     in patients over 73 years of age),     is one component of     a comprehensive surveillance     program.  Test performance has     been validated by [employer] [facility] for patients greater     than or equal to 73 year old.     It is not intended     to diagnose infection nor to     guide or monitor treatment.    Medical History: Past Medical History  Diagnosis Date  . Hypertension   . Diabetes mellitus without complication   . Cancer     vocal cord  . Arthritis     Medications:  Prescriptions prior to admission  Medication Sig Dispense Refill   . aspirin EC 325 MG tablet Take 1 tablet (325 mg total) by mouth 2 (two) times daily.  30 tablet  0  . Dapagliflozin Propanediol (FARXIGA) 5 MG TABS Take 5 mg by mouth daily.      Marland Kitchen dextrose 5 % SOLN 50 mL with ceFEPIme 2 G SOLR 2 g Inject 2 g into the vein every 12 (twelve) hours.  84 Units  0  . diltiazem (TIAZAC) 300 MG 24 hr capsule Take 300 mg by mouth daily.      . insulin glargine (LANTUS) 100 UNIT/ML injection Inject 20 Units into the skin at bedtime.      . insulin lispro protamine-lispro (HUMALOG 75/25) (75-25) 100 UNIT/ML SUSP injection Inject 45 Units into the skin daily with breakfast.      . lisinopril (PRINIVIL,ZESTRIL) 20 MG tablet Take 20 mg by mouth daily.      . methocarbamol (ROBAXIN) 500 MG tablet Take 1 tablet (500 mg total) by mouth 2 (two) times daily with a meal.  60 tablet  0  . oxyCODONE-acetaminophen (ROXICET) 5-325 MG per tablet Take 1 tablet by mouth every 4 (four) hours as needed for pain.  60 tablet  0  . rifampin (RIFADIN) 300 MG capsule Take 1 capsule (300 mg total) by mouth daily.  42 capsule  0  . sodium chloride 0.9 % SOLN 500 mL with vancomycin 10 G SOLR 1,500 mg Inject 1,500 mg into the vein every 12 (twelve) hours.  84 Units  0  . tamsulosin (FLOMAX) 0.4 MG CAPS capsule Take 0.4 mg by mouth daily.        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  Treated with vancomycin and cefepime since 9/12.  VT =13.2 on 9/16 on Vanc 1250 mg IV q12h. Vanc increased to 1.5g IV q12h on 9/16, discharged 9/17 and readmitted on 9/23. Renal function stable (CrCl ~110) Afeb, WBC 10.5>11.1  Goal of Therapy:  Vancomycin trough level 15-20 mcg/ml  Plan:  - Vanc 1.5g IV q12h - VT at 1130 on 9/28 - Cefepime 2g IV q12h per MD - LOT x 6 wks post resection   Margie Billet, PharmD Clinical Pharmacist - Resident Pager: [phone] Pharmacy: [phone] 05/26/2013 1:05 PM

## 2013-05-26 NOTE — Progress Notes (Signed)
Physical Therapy Treatment Patient Details Name: Edwin Shaffer MRN: [id number] DOB: 24-Aug-1946 Today's Date: 05/26/2013 Time: 4098-1191 PT Time Calculation (min): 23 min  PT Assessment / Plan / Recommendation  History of Present Illness Pt with multiple recent admissions due to infected Lt knee   PT Comments   Pt making progress with mobility as evident in ability to increase ambulation distance.  He becomes very anxious with anticipation of pain & fear of pain.  Once pt calms down he moves well & listens to cues/commands much better.     Follow Up Recommendations  SNF;Supervision/Assistance - 24 hour     Does the patient have the potential to tolerate intense rehabilitation     Barriers to Discharge        Equipment Recommendations  None recommended by PT    Recommendations for Other Services OT consult  Frequency Min 5X/week   Progress towards PT Goals Progress towards PT goals: Progressing toward goals  Plan Current plan remains appropriate    Precautions / Restrictions Precautions Precautions: Knee;Fall Precaution Comments: No ROM on Lt knee; reviewed wear schedule and precautions with pt extensively Required Braces or Orthoses: Knee Immobilizer - Left Knee Immobilizer - Left: On at all times Restrictions LLE Weight Bearing: Weight bearing as tolerated   Pertinent Vitals/Pain C/o Lt knee pain.  RN administered pain medication.      Mobility  Bed Mobility Bed Mobility: Supine to Sit;Sitting - Scoot to Edge of Bed Supine to Sit: 4: Min assist;With rails;HOB elevated Sitting - Scoot to Edge of Bed: 4: Min guard;With rail Details for Bed Mobility Assistance: (A) for LLE management Transfers Transfers: Sit to Stand;Stand to Sit Sit to Stand: 4: Min assist;With upper extremity assist;From bed Stand to Sit: 4: Min assist;With upper extremity assist;With armrests;To chair/3-in-1 Details for Transfer Assistance: (A) to achieve upright standing position and maintain  balance; pt with lean to Rt to off weight Lt LE due to pain; max cues for hand placement and seqeuencing with RW Ambulation/Gait Ambulation/Gait Assistance: 4: Min guard Ambulation Distance (Feet): 50 Feet Assistive device: Rolling walker Ambulation/Gait Assistance Details: Max cues for sequencing & safe RW advancement initially.  Once pt relaxed & anxiety decreased he was able to continue without max cues.   Gait Pattern: Step-to pattern;Wide base of support;Step-through pattern General Gait Details: +2 to follow with recliner.  Pt beginning to progress to step-through gait pattern.  Requires max cues/encouragement to relax & calm down.   Stairs: No      PT Goals (current goals can now be found in the care plan section) Acute Rehab PT Goals PT Goal Formulation: With patient Time For Goal Achievement: 05/31/13 Potential to Achieve Goals: Good  Visit Information  Last PT Received On: 05/26/13 Assistance Needed: +1 History of Present Illness: Pt with multiple recent admissions due to infected Lt knee    Subjective Data      Cognition  Cognition Arousal/Alertness: Awake/alert Behavior During Therapy: WFL for tasks assessed/performed Overall Cognitive Status: Within Functional Limits for tasks assessed General Comments: Increased anxiety with anticipation of pain.  Pt requires one step commands at all times to stay on task; easily confused with sequencing     Balance     End of Session PT - End of Session Equipment Utilized During Treatment: Gait belt;Left knee immobilizer Activity Tolerance: Patient tolerated treatment well Patient left: in chair;with call bell/phone within reach Nurse Communication: Mobility status;Patient requests pain meds   GP     Verdell Face  Lynn 05/26/2013, 11:26 AM  Verdell Face, PTA [phone] 05/26/2013

## 2013-05-26 NOTE — Progress Notes (Signed)
Pt seen 8:00am. Pt 3 days S/P L TKA with removal of parts and placement of ABX spacer per Dr Turner Daniels. He received 2U of PRBC's yesterday and reports increased energy and feeling improved. Has been making slow gains in PT. Reports pain is mild and well controlled. He has experienced some nausea and reports lack of appetite. No emesis. Pt had difficulty voiding yesterday and foley was placed. Denies SOB, chest pain, fatigue.  BP 173/87  Pulse 93  Temp(Src) 98.6 F (37 C) (Oral)  Resp 18  Ht 6' 2.41" (1.89 m)  Wt 126.1 kg (278 lb)  BMI 35.3 kg/m2  SpO2 99%  Hg up to 8.8 today from 7.4 yesterday. HCT 26, WBC 11.1  Drain output 210 in last 24 hours.   Intake/Output Summary (Last 24 hours) at 05/26/13 1034 Last data filed at 05/26/13 1027  Gross per 24 hour  Intake 4425.83 ml  Output   2210 ml  Net 2215.83 ml    Pt laying comfortably in hospital bed. Ace wrap in place over L knee, 2 drains in place. Staples in place over incision, no drainage, erythema or sign of infection. No increased warmth compared to contralateral side. 2+ DPP = BIL. _ Homans BIL, NVI. DFLX & PFLX intact.  3 Days S/P L TKA with ABX spacer  -Cont IV Vanc and Maxipime   -Acute Blood Loss Anemia improved S/P 2U PRBC's given yesterday  -Urinary retention, pt was voiding on own but had difficulty yesterday and foley replaced. Will remove later today    and trial voiding on own again  -Cont PT/OT   -WBAT  -Drains to remain in place over weekend  -DVT prophylaxis: SCD's, ASA 325 BID x2 weeks  -D/C to SNF [facility]) on IV ABX likely early next week

## 2013-05-26 NOTE — Progress Notes (Signed)
Pt c/o intense pain in left knee after PT ambulation in the hallway this morning. Called PA and MD to verify pt to be ambulated WBAT on the left side. MD said to ambulate on that left side only if pt is able to tolerate. Called PT Brittney to relate MD information. Got order for one time dose of oxy contin for pain relief. Pt pain is being controlled at this time. Will continue to monitor. Peter Congo RN

## 2013-05-27 LAB — GLUCOSE, CAPILLARY
Glucose-Capillary: 152 mg/dL — ABNORMAL HIGH (ref 70–99)
Glucose-Capillary: 120 mg/dL — ABNORMAL HIGH (ref 70–99)
Glucose-Capillary: 210 mg/dL — ABNORMAL HIGH (ref 70–99)

## 2013-05-27 LAB — VANCOMYCIN, TROUGH: Vancomycin Tr: 19.9 ug/mL (ref 10.0–20.0)

## 2013-05-27 NOTE — Progress Notes (Signed)
Patient consistently rating pain in left knee 5-6 on a 0-10 scale over the course of the shift.  Patient states that it's "not bad" when he takes his pain medication regularly, but still rates the pain a 5-6 upon reassessment.  Patient states he has no other needs at this time.  Will continue to monitor.

## 2013-05-27 NOTE — Progress Notes (Signed)
Patient has his diabetic medication at the bedside.  He insist to keep it with him.  He has Dapagliflozin Marcelline Deist) 5 mg one tablet each day for diabetes.

## 2013-05-27 NOTE — Progress Notes (Signed)
ANTIBIOTIC CONSULT NOTE - INITIAL  Pharmacy Consult for Vancomycin Indication: Infected L TKA  No Known Allergies  Patient Measurements: Height: 6' 2.41" (189 cm) Weight: 278 lb (126.1 kg) IBW/kg (Calculated) : 83.14  Vital Signs: Temp: 99.2 F (37.3 C) (09/28 0556) Temp src: Oral (09/28 0556) BP: 173/91 mmHg (09/28 1012) Pulse Rate: 87 (09/28 0556) Intake/Output from previous day: 09/27 0701 - 09/28 0700 In: 910 [P.O.:360; IV Piggyback:550] Out: 2505 [Urine:2350; Drains:155] Intake/Output from this shift: Total I/O In: 20 [I.V.:20] Out: 445 [Urine:425; Drains:20]  Labs:  Recent Labs  05/25/13 0500 05/26/13 0510  WBC 10.5 11.1*  HGB 7.4* 8.8*  PLT 359 372  CREATININE  --  0.95   Estimated Creatinine Clearance: 110 ml/min (by C-G formula based on Cr of 0.95).  Recent Labs  05/27/13 1124  VANCOTROUGH 19.9     Microbiology: Recent Results (from the past 720 hour(s))  ANAEROBIC CULTURE     Status: None   Collection Time    05/11/13  1:44 PM      Result Value Range Status   Specimen Description TISSUE KNEE LEFT   Final   Special Requests LEFT KNEE DEEP TISSUE RECEIVED IN CUP   Final   Gram Stain     Final   Value: NO WBC SEEN     NO ORGANISMS SEEN     Performed at [facility]   Culture     Final   Value: NO ANAEROBES ISOLATED     Performed at [facility]   Report Status 05/16/2013 FINAL   Final  TISSUE CULTURE     Status: None   Collection Time    05/11/13  1:44 PM      Result Value Range Status   Specimen Description TISSUE KNEE LEFT   Final   Special Requests LEFT KNEE DEEP RECEIVED IN CUP   Final   Gram Stain     Final   Value: NO WBC SEEN     NO ORGANISMS SEEN     Performed at [facility]   Culture     Final   Value: NO GROWTH 3 DAYS     Performed at [facility]   Report Status 05/14/2013 FINAL   Final  ANAEROBIC CULTURE     Status: None   Collection Time    05/11/13  1:48 PM      Result Value Range  Status   Specimen Description ABSCESS KNEE LEFT   Final   Special Requests LEFT KNEE DEEP RECEIVED SWAB   Final   Gram Stain     Final   Value: NO WBC SEEN     NO SQUAMOUS EPITHELIAL CELLS SEEN     NO ORGANISMS SEEN     Performed at [facility]   Culture     Final   Value: NO ANAEROBES ISOLATED     Performed at [facility]   Report Status 05/16/2013 FINAL   Final  CULTURE, ROUTINE-ABSCESS     Status: None   Collection Time    05/11/13  1:48 PM      Result Value Range Status   Specimen Description ABSCESS KNEE LEFT   Final   Special Requests LEFT KNEE DEEP RECEIVED SWAB   Final   Gram Stain     Final   Value: NO WBC SEEN     NO SQUAMOUS EPITHELIAL CELLS SEEN     NO ORGANISMS SEEN     Performed at [facility]   Culture     Final   Value: NO GROWTH 3 DAYS     Performed at [facility]   Report Status 05/14/2013 FINAL   Final  SURGICAL PCR SCREEN     Status: None   Collection Time    05/23/13  5:49 AM      Result Value Range Status   MRSA, PCR NEGATIVE  NEGATIVE Final   Staphylococcus aureus NEGATIVE  NEGATIVE Final   Comment:            The Xpert SA Assay (FDA     approved for NASAL specimens     in patients over 55 years of age),     is one component of     a comprehensive surveillance     program.  Test performance has     been validated by [employer] for patients greater     than or equal to 64 year old.     It is not intended     to diagnose infection nor to     guide or monitor treatment.    Medical History: Past Medical History  Diagnosis Date  . Hypertension   . Diabetes mellitus without complication   . Cancer     vocal cord  . Arthritis     Medications:  Prescriptions prior to admission  Medication Sig Dispense Refill  . aspirin EC 325 MG tablet Take 1 tablet (325 mg total) by mouth 2 (two) times daily.  30 tablet  0  . Dapagliflozin Propanediol (FARXIGA) 5 MG TABS Take 5 mg by mouth daily.      Marland Kitchen dextrose 5  % SOLN 50 mL with ceFEPIme 2 G SOLR 2 g Inject 2 g into the vein every 12 (twelve) hours.  84 Units  0  . diltiazem (TIAZAC) 300 MG 24 hr capsule Take 300 mg by mouth daily.      . insulin glargine (LANTUS) 100 UNIT/ML injection Inject 20 Units into the skin at bedtime.      . insulin lispro protamine-lispro (HUMALOG 75/25) (75-25) 100 UNIT/ML SUSP injection Inject 45 Units into the skin daily with breakfast.      . lisinopril (PRINIVIL,ZESTRIL) 20 MG tablet Take 20 mg by mouth daily.      . methocarbamol (ROBAXIN) 500 MG tablet Take 1 tablet (500 mg total) by mouth 2 (two) times daily with a meal.  60 tablet  0  . oxyCODONE-acetaminophen (ROXICET) 5-325 MG per tablet Take 1 tablet by mouth every 4 (four) hours as needed for pain.  60 tablet  0  . rifampin (RIFADIN) 300 MG capsule Take 1 capsule (300 mg total) by mouth daily.  42 capsule  0  . sodium chloride 0.9 % SOLN 500 mL with vancomycin 10 G SOLR 1,500 mg Inject 1,500 mg into the vein every 12 (twelve) hours.  84 Units  0  . tamsulosin (FLOMAX) 0.4 MG CAPS capsule Take 0.4 mg by mouth daily.        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 Treated with vancomycin and cefepime since 9/12.   On 9/16, VT =13.2 on 1250 mg IV q12h. Vanc increased to 1.5g IV q12h on 9/16, discharged 9/17 and readmitted on 9/23.  Renal function stable (CrCl ~110), tmax 100.5, WBC 10.5>11.1. Vanc trough today therapeutic at 19.9 so will continue course and recheck as needed.   Goal of  Therapy:  Vancomycin trough level 15-20 mcg/ml  Plan:  - Vanc 1.5g IV q12h - Cefepime 2g IV q12h per MD - LOT x 6 wks post resection - Continue to monitor temp, WBC, renal function, clinical improvement   Margie Billet, PharmD Clinical Pharmacist - Resident Pager: [phone] Pharmacy: [phone] 05/27/2013 1:32 PM

## 2013-05-28 LAB — GLUCOSE, CAPILLARY
Glucose-Capillary: 157 mg/dL — ABNORMAL HIGH (ref 70–99)
Glucose-Capillary: 142 mg/dL — ABNORMAL HIGH (ref 70–99)
Glucose-Capillary: 138 mg/dL — ABNORMAL HIGH (ref 70–99)

## 2013-05-28 NOTE — Progress Notes (Signed)
PATIENT ID: Edwin Shaffer  MRN: [id number]  DOB/AGE:  1946/05/31 / 67 y.o.  5 Days Post-Op Procedure(s) (LRB): EXCISIONAL TOTAL KNEE ARTHROPLASTY WITH ANTIBIOTIC SPACERS Left (Left)    PROGRESS NOTE Subjective: Patient is alert, oriented, no Nausea, no Vomiting, yes passing gas, no Bowel Movement. Taking PO well. Denies SOB, Chest or Calf Pain. Using Incentive Spirometer, PAS in place. Ambulate wbat with walker and knee immobilizer, Patient reports pain as mild  .    Objective: Vital signs in last 24 hours: Filed Vitals:   05/27/13 1457 05/27/13 1600 05/27/13 2100 05/28/13 0513  BP: 143/91  164/87 168/93  Pulse: 99  96 94  Temp: 99.5 F (37.5 C)  98.3 F (36.8 C) 98.1 F (36.7 C)  TempSrc: Oral  Oral Oral  Resp: 20 16  18   Height:      Weight:      SpO2: 100%  96% 100%      Intake/Output from previous day: I/O last 3 completed shifts: In: 940 [P.O.:920; I.V.:20] Out: 3620 [Urine:3425; Drains:195]   Intake/Output this shift:     LABORATORY DATA:  Recent Labs  05/26/13 0510  05/27/13 1644 05/27/13 2214 05/28/13 0631  WBC 11.1*  --   --   --   --   HGB 8.8*  --   --   --   --   HCT 26.0*  --   --   --   --   PLT 372  --   --   --   --   CREATININE 0.95  --   --   --   --   GLUCAP  --   < > 120* 210* 157*  < > = values in this interval not displayed.  Examination: Neurologically intact Neurovascular intact Sensation intact distally Intact pulses distally Dorsiflexion/Plantar flexion intact Incision: dressing C/D/I No cellulitis present Compartment soft} Blood and plasma separated in drain indicating minimal recent drainage, drain pulled without difficulty.Large bore drain pulled.  Small bore left intact.  Assessment:   5 Days Post-Op Procedure(s) (LRB): EXCISIONAL TOTAL KNEE ARTHROPLASTY WITH ANTIBIOTIC SPACERS Left (Left) ADDITIONAL DIAGNOSIS:  Acute Blood Loss Anemia, Hyperglycemia and Hypertension  Plan: PT/OT WBAT with walker DVT Prophylaxis:   SCDx72hrs, ASA 325 mg BID x 2 weeks DISCHARGE PLAN: Home probably tomorrow DISCHARGE NEEDS: HHPT, HHRN, Walker, 3-in-1 comode seat and IV Antibiotics     Promiss Labarbera R 05/28/2013, 7:38 AM

## 2013-05-28 NOTE — Progress Notes (Signed)
Physical Therapy Treatment Patient Details Name: Edwin Shaffer MRN: [id number] DOB: Dec 16, 1945 Today's Date: 05/28/2013 Time: 0865-7846 PT Time Calculation (min): 24 min  PT Assessment / Plan / Recommendation  History of Present Illness Pt with multiple recent admissions due to infected Lt knee   PT Comments   Making progress with transfers and mobility/ amb; Noted plans for dc home  Follow Up Recommendations  Supervision/Assistance - 24 hour;Home health PT (plan is changed to homw per PA)     Does the patient have the potential to tolerate intense rehabilitation     Barriers to Discharge        Equipment Recommendations  None recommended by PT    Recommendations for Other Services OT consult  Frequency Min 5X/week   Progress towards PT Goals Progress towards PT goals: Progressing toward goals;Goals met and updated - see care plan  Plan Discharge plan needs to be updated    Precautions / Restrictions Precautions Precautions: Knee;Fall Precaution Comments: No ROM on Lt knee; reviewed wear schedule and precautions with pt extensively Required Braces or Orthoses: Knee Immobilizer - Left Knee Immobilizer - Left: On at all times Restrictions LLE Weight Bearing: Weight bearing as tolerated   Pertinent Vitals/Pain Did not rate, but pain was tolerable patient repositioned for comfort     Mobility  Bed Mobility Details for Bed Mobility Assistance: Sitting eOB upon arrival Transfers Transfers: Sit to Stand;Stand to Sit Sit to Stand: With upper extremity assist;From bed;4: Min guard Stand to Sit: With upper extremity assist;With armrests;To chair/3-in-1;4: Min guard Details for Transfer Assistance: Ceus for technique and hand placemetn; less need for cues and no need for physical assist Ambulation/Gait Ambulation/Gait Assistance: 4: Min guard Ambulation Distance (Feet): 100 Feet Assistive device: Rolling walker Ambulation/Gait Assistance Details: Better smoothness of gait  today; Pt not tolerating a lot of weight on LLE, is pushing down into RW to [place] L -- assured pt that he can adjust weight bearing to his tolerance Gait Pattern: Step-to pattern;Wide base of support;Step-through pattern Gait velocity: decreased Stairs: No    Exercises     PT Diagnosis:    PT Problem List:   PT Treatment Interventions:     PT Goals (current goals can now be found in the care plan section) Acute Rehab PT Goals Patient Stated Goal: to get better PT Goal Formulation: With patient Time For Goal Achievement: 05/31/13 Potential to Achieve Goals: Good  Visit Information  Last PT Received On: 05/28/13 Assistance Needed: +1 History of Present Illness: Pt with multiple recent admissions due to infected Lt knee    Subjective Data  Subjective: Agreeable to amb; it is apparent that he needs to feel understood Patient Stated Goal: to get better   Cognition  Cognition Arousal/Alertness: Awake/alert Behavior During Therapy: WFL for tasks assessed/performed Overall Cognitive Status: Within Functional Limits for tasks assessed    Balance     End of Session PT - End of Session Equipment Utilized During Treatment: Gait belt;Left knee immobilizer Activity Tolerance: Patient tolerated treatment well Patient left: in chair;with call bell/phone within reach Nurse Communication: Mobility status;Patient requests pain meds   GP     Van Clines [facility] [place], [place] [phone]  05/28/2013, 5:00 PM

## 2013-05-28 NOTE — Progress Notes (Signed)
SW spoke with the PA and the patient plans to discharge home with home health.  Clinical Social Worker will sign off for now as social work intervention is no longer needed. Please consult [place] again if new need arises.   Sabino Niemann, MSW, [employer] [phone]

## 2013-05-29 LAB — GLUCOSE, CAPILLARY
Glucose-Capillary: 135 mg/dL — ABNORMAL HIGH (ref 70–99)
Glucose-Capillary: 139 mg/dL — ABNORMAL HIGH (ref 70–99)
Glucose-Capillary: 104 mg/dL — ABNORMAL HIGH (ref 70–99)

## 2013-05-29 MED ORDER — MAGNESIUM CITRATE PO SOLN
1.0000 | Freq: Once | ORAL | Status: AC
  Administered 2013-05-29: 1 via ORAL
  Filled 2013-05-29: qty 296

## 2013-05-29 NOTE — Progress Notes (Signed)
Patient complained of constipation unrelieved by PRN dulcolax and senokot-s.  [name], PA notified; new orders received for mag citrate and soap suds enema if mag citrate ineffective.  Patient received mag citrate with no results; patient refusing the enema.  Explained purpose and importance of the enema but patient continues to refuse.  Will continue to monitor.

## 2013-05-29 NOTE — Progress Notes (Signed)
Physical Therapy Treatment Patient Details Name: Edwin Shaffer MRN: [id number] DOB: 1946-07-08 Today's Date: 05/29/2013 Time: 1345-1415 PT Time Calculation (min): 30 min  PT Assessment / Plan / Recommendation  History of Present Illness Pt with multiple recent admissions due to infected Lt knee   PT Comments   Continuing progress, and pt seemed genuinely excited at the possibility of going home; Pt with difficulty understanding that he can lead eith the LLE going down 1 step while keeping 50%PWB -- perhaps next session we should practice steps again, and he can proceed NWB LLE if he feels like he is putting too much on LE  Follow Up Recommendations  Supervision/Assistance - 24 hour;Home health PT (plan is changed to homw per PA)     Does the patient have the potential to tolerate intense rehabilitation     Barriers to Discharge        Equipment Recommendations  None recommended by PT    Recommendations for Other Services OT consult  Frequency Min 5X/week   Progress towards PT Goals Progress towards PT goals: Progressing toward goals  Plan Current plan remains appropriate    Precautions / Restrictions Precautions Precautions: Knee;Fall Precaution Comments: No ROM on Lt knee; reviewed wear schedule and precautions with pt extensively Required Braces or Orthoses: Knee Immobilizer - Left Knee Immobilizer - Left: On at all times Restrictions LLE Weight Bearing: Partial weight bearing LLE Partial Weight Bearing Percentage or Pounds: 50%   Pertinent Vitals/Pain 6-7/10; was premedicated     Mobility  Bed Mobility Details for Bed Mobility Assistance: Sitting eOB upon arrival Transfers Transfers: Sit to Stand;Stand to Sit Sit to Stand: With upper extremity assist;From bed;4: Min guard Stand to Sit: With upper extremity assist;With armrests;To chair/3-in-1;4: Min guard Details for Transfer Assistance: Ceus for technique and hand placemetn; less need for cues and no need for  physical assist Ambulation/Gait Ambulation/Gait Assistance: 4: Min guard Ambulation Distance (Feet): 20 Feet Assistive device: Rolling walker Ambulation/Gait Assistance Details: Verbal and demo cues for 50%PWB LLE Gait Pattern: Step-to pattern;Wide base of support;Step-through pattern Gait velocity: decreased Stairs: Yes Stairs Assistance: 4: Min guard Stairs Assistance Details (indicate cue type and reason): Extensive verbal and demo cueing to ascend step backwards in order to keep 50%PWB; Pt seemed confused with leading with soreLLE coming down, will need reinforcement Stair Management Technique: No rails;Backwards;With walker;Step to pattern Number of Stairs: 1    Exercises     PT Diagnosis:    PT Problem List:   PT Treatment Interventions:     PT Goals (current goals can now be found in the care plan section) Acute Rehab PT Goals Patient Stated Goal: to get better PT Goal Formulation: With patient Time For Goal Achievement: 05/31/13 Potential to Achieve Goals: Good  Visit Information  Last PT Received On: 05/29/13 Assistance Needed: +1 History of Present Illness: Pt with multiple recent admissions due to infected Lt knee    Subjective Data  Subjective: Agreeable to steps; Verbalizing understanding of 50%PWB Patient Stated Goal: to get better   Cognition  Cognition Arousal/Alertness: Awake/alert Behavior During Therapy: WFL for tasks assessed/performed Overall Cognitive Status: Within Functional Limits for tasks assessed Area of Impairment: Problem solving General Comments: Increased anxiety with anticipation of pain.  Pt requires one step commands at all times to stay on task; easily confused with sequencing     Balance     End of Session PT - End of Session Equipment Utilized During Treatment: Gait belt;Left knee immobilizer Activity Tolerance:  Patient tolerated treatment well Patient left: in chair (looking out hallway window; had cell phone) Nurse  Communication: Mobility status   GP     Van Clines [facility] [place], [place] [phone]  05/29/2013, 4:04 PM

## 2013-05-29 NOTE — Progress Notes (Signed)
Patient ID: Edwin Shaffer, male   DOB: 13-Jun-1946, 67 y.o.   MRN: [id number] PATIENT ID: Edwin Shaffer  MRN: [id number]  DOB/AGE:  11-07-1945 / 67 y.o.  6 Days Post-Op Procedure(s) (LRB): EXCISIONAL TOTAL KNEE ARTHROPLASTY WITH ANTIBIOTIC SPACERS Left (Left)    PROGRESS NOTE Subjective: Patient is alert, oriented, no Nausea, no Vomiting, yes passing gas, yes Bowel Movement. Taking PO well. Denies SOB, Chest or Calf Pain. Using Incentive Spirometer, PAS in place. Ambulate 50% weightbearing with knee immobilizer in place, no CPM secondary to placement of spacer block Patient reports pain as 3 on 0-10 scale  .    Objective: Vital signs in last 24 hours: Filed Vitals:   05/28/13 1400 05/28/13 1600 05/28/13 2206 05/29/13 0725  BP: 152/78  150/80 152/86  Pulse: 95  90 85  Temp: 99 F (37.2 C)  98.6 F (37 C) 98.6 F (37 C)  TempSrc:      Resp: 18 17 18 18   Height:      Weight:      SpO2: 100%  100% 99%      Intake/Output from previous day: I/O last 3 completed shifts: In: 3230 [P.O.:480; IV Piggyback:2750] Out: 3190 [Urine:3000; Drains:190]   Intake/Output this shift:     LABORATORY DATA:  Recent Labs  05/28/13 1606 05/28/13 2223 05/29/13 0650  GLUCAP 142* 138* 135*    Examination: Neurologically intact ABD soft Neurovascular intact Sensation intact distally Intact pulses distally Dorsiflexion/Plantar flexion intact Incision: no drainage No cellulitis present Compartment soft} A single superficial drain has put out 50 cc of cherry colored fluid since yesterday, I took the dressing down the staple line was completely healed with no drainage. Assessment:   6 Days Post-Op Procedure(s) (LRB): EXCISIONAL TOTAL KNEE ARTHROPLASTY WITH ANTIBIOTIC SPACERS Left (Left) ADDITIONAL DIAGNOSIS:  Diabetes and Hypertension  Plan: PT/OT PATIENT IS PARTIAL WEIGHTBEARING APPROXIMATELY 50% WITH KNEE IMMOBILIZER, SECONDARY TO ANTIBIOTIC SPACER. We'll monitor her drainage tonight  if it drops below 50 cc, maple drain tomorrow and let patient go home. DVT Prophylaxis:  SCDx72hrs, ASA 325 mg BID x 2 weeks DISCHARGE PLAN: Home, probable staple removal late this week or early next week DISCHARGE NEEDS: HHPT, HHRN, CPM, Walker, 3-in-1 comode seat and IV Antibiotics, Maxipime, and vancomycin.      Brietta Manso J 05/29/2013, 7:28 AM

## 2013-05-29 NOTE — Progress Notes (Addendum)
Occupational Therapy Treatment Patient Details Name: Edwin Shaffer MRN: [id number] DOB: 06-05-1946 Today's Date: 05/29/2013 Time: 1520-1550 OT Time Calculation (min): 30 min  OT Assessment / Plan / Recommendation  History of present illness Pt with multiple recent admissions due to infected Lt knee   OT comments  Pt limited by pain. Performed bathing, toileting, as well as grooming at sink. Planning to d/c home. Recommending HHOT upon d/c.   Follow Up Recommendations  Home health OT;Supervision/Assistance - 24 hour    Barriers to Discharge       Equipment Recommendations  None recommended by OT    Recommendations for Other Services    Frequency Min 2X/week   Progress towards OT Goals Progress towards OT goals: Progressing toward goals (Goals updated)  Plan Discharge plan needs to be updated    Precautions / Restrictions Precautions Precautions: Knee;Fall Precaution Comments: No ROM on Lt knee Required Braces or Orthoses: Knee Immobilizer - Left Knee Immobilizer - Left: On at all times Restrictions Weight Bearing Restrictions: Yes LLE Weight Bearing: Partial weight bearing LLE Partial Weight Bearing Percentage or Pounds: 50%   Pertinent Vitals/Pain Pain 7/10. Nurse notified that pt wanted to see her.     ADL  Grooming: Teeth care;Wash/dry face;Min guard;Set up;Supervision/safety (teeth-min guard and washed face-setup/supervision) Where Assessed - Grooming: Supported sitting;Supported standing (teeth-standing at sink and washed face-sitting on 3 in 1) Upper Body Bathing: Set up;Supervision/safety Where Assessed - Upper Body Bathing: Supported sitting Lower Body Bathing: Min guard Where Assessed - Lower Body Bathing: Supported sit to stand Toilet Transfer: Hydrographic surveyor Method: Sit to Barista: Comfort height toilet;Bedside commode (sit to stand from [facility] and stood to urinate at toilet) Toileting - Architect and Hygiene:  Min guard Where Assessed - Toileting Clothing Manipulation and Hygiene: Standing Equipment Used: Gait belt;Rolling walker;Knee Immobilizer Transfers/Ambulation Related to ADLs: Min guard ADL Comments: Pt limited by pain. He ambulated to bathroom and stood to urinate. Pt also practiced sit to stand from [facility]. Pt bathed off sitting on BSC. Educated pt to stand in front of chair/bed with walker in front when pulling up LB clothing for safety. Educated on dressing technique. Spoke about AE for LB ADLs. Pt sitting on BSC and wanted to sit there to brush teeth, but with encouragement stood at sink to brush them.     OT Diagnosis:    OT Problem List:   OT Treatment Interventions:     OT Goals(current goals can now be found in the care plan section) Acute Rehab OT Goals Patient Stated Goal: hopefully go home tomorrow OT Goal Formulation: With patient Time For Goal Achievement: 05/31/13 Potential to Achieve Goals: Good ADL Goals Pt Will Perform Grooming: with set-up;standing Pt Will Perform Lower Body Bathing: with set-up;with supervision;sit to/from stand;with adaptive equipment Pt Will Transfer to Toilet: with modified independence;ambulating (3 in 1 commode) Pt Will Perform Toileting - Clothing Manipulation and hygiene: with modified independence;sit to/from stand  Visit Information  Last OT Received On: 05/29/13 Assistance Needed: +1 History of Present Illness: Pt with multiple recent admissions due to infected Lt knee    Subjective Data      Prior Functioning       Cognition  Cognition Arousal/Alertness: Awake/alert Behavior During Therapy: WFL for tasks assessed/performed Overall Cognitive Status: Within Functional Limits for tasks assessed General Comments: anxious at times.   Mobility  Bed Mobility Bed Mobility: Sit to Supine Sit to Supine: 4: Min assist Details for Bed Mobility Assistance:  Pt wanted to sit with left leg propped on bed at end of session with HOB elevated  (not full supine position). Rt leg not fully on bed. OT assisted with left leg.  Transfers Transfers: Sit to Stand;Stand to Sit Sit to Stand: 4: Min guard;With upper extremity assist;From chair/3-in-1 Stand to Sit: 4: Min guard;With upper extremity assist;To bed;To chair/3-in-1 Details for Transfer Assistance: Min guard for safety. cues for technique and hand placement    Exercises      Balance     End of Session OT - End of Session Equipment Utilized During Treatment: Rolling walker;Gait belt;Left knee immobilizer Activity Tolerance: Patient limited by pain Patient left: in bed;with call bell/phone within reach;with bed alarm set Nurse Communication: Other (comment) (pt wanting nurse)  Lorri Frederick OTR/L [phone] 05/29/2013, 4:19 PM

## 2013-05-30 ENCOUNTER — Encounter (HOSPITAL_COMMUNITY): Payer: Self-pay | Admitting: General Practice

## 2013-05-30 LAB — BASIC METABOLIC PANEL
BUN: 12 mg/dL (ref 6–23)
CO2: 25 mEq/L (ref 19–32)
Calcium: 9.1 mg/dL (ref 8.4–10.5)
GFR calc Af Amer: >90 mL/min (ref >90)
GFR calc non Af Amer: 87 mL/min — ABNORMAL LOW (ref >90)
Glucose, Bld: 142 mg/dL — ABNORMAL HIGH (ref 70–99)
Potassium: 4.1 mEq/L (ref 3.5–5.1)

## 2013-05-30 LAB — GLUCOSE, CAPILLARY: Glucose-Capillary: 155 mg/dL — ABNORMAL HIGH (ref 70–99)

## 2013-05-30 MED ORDER — CEFEPIME-DEXTROSE 2 GM/50ML IV SOLR: 2.0000 g | Freq: Two times a day (BID) | INTRAVENOUS | Status: DC

## 2013-05-30 MED ORDER — OXYCODONE-ACETAMINOPHEN 5-325 MG PO TABS: 1.0000 | ORAL_TABLET | ORAL | Status: DC | PRN

## 2013-05-30 MED ORDER — HEPARIN SOD (PORK) LOCK FLUSH 100 UNIT/ML IV SOLN: 250.0000 [IU] | INTRAVENOUS | Status: DC | PRN

## 2013-05-30 MED ORDER — ASPIRIN EC 325 MG PO TBEC: 325.0000 mg | DELAYED_RELEASE_TABLET | Freq: Two times a day (BID) | ORAL | Status: DC

## 2013-05-30 MED ORDER — ALTEPLASE 2 MG IJ SOLR
2.0000 mg | Freq: Once | INTRAMUSCULAR | Status: AC
  Administered 2013-05-30: 2 mg
  Filled 2013-05-30 (×2): qty 2

## 2013-05-30 MED ORDER — VANCOMYCIN HCL 10 G IV SOLR: 1500.0000 mg | Freq: Once | INTRAVENOUS | Status: DC

## 2013-05-30 MED ORDER — HEPARIN SOD (PORK) LOCK FLUSH 100 UNIT/ML IV SOLN
250.0000 [IU] | INTRAVENOUS | Status: AC | PRN
  Administered 2013-05-30: 250 [IU]

## 2013-05-30 NOTE — Discharge Summary (Deleted)
Patient ID: Edwin Shaffer MRN: [id number] DOB/AGE: [birth year] 65 y.o.  Admit date: 05/22/2013 Discharge date: 05/30/2013  Admission Diagnoses:  Principal Problem:   Infection of total left knee replacement   Discharge Diagnoses:  Same  Past Medical History  Diagnosis Date  . Hypertension   . Diabetes mellitus without complication   . Cancer     vocal cord  . Arthritis     Surgeries: Procedure(s): EXCISIONAL TOTAL KNEE ARTHROPLASTY WITH ANTIBIOTIC SPACERS Left on 05/22/2013 - 05/23/2013   Consultants:    Discharged Condition: Improved  Hospital Course: Edwin Shaffer is an 67 y.o. male who was admitted 05/22/2013 for operative treatment ofInfection of total left knee replacement. Patient has severe unremitting pain that affects sleep, daily activities, and work/hobbies. After pre-op clearance the patient was taken to the operating room on 05/22/2013 - 05/23/2013 and underwent  Procedure(s): EXCISIONAL TOTAL KNEE ARTHROPLASTY WITH ANTIBIOTIC SPACERS Left.    Patient was given perioperative antibiotics: Anti-infectives   Start     Dose/Rate Route Frequency Ordered Stop   05/23/13 1622  vancomycin (VANCOCIN) powder  Status:  Discontinued       As needed 05/23/13 1623 05/23/13 1828   05/23/13 1621  tobramycin (NEBCIN) powder  Status:  Discontinued       As needed 05/23/13 1622 05/23/13 1828   05/23/13 1445  tobramycin (NEBCIN) powder 4.8 g  Status:  Discontinued     4.8 g Topical To Surgery 05/23/13 1434 05/23/13 1947   05/23/13 1445  vancomycin (VANCOCIN) powder 4,000 mg  Status:  Discontinued     4,000 mg Other To Surgery 05/23/13 1434 05/23/13 1947   05/23/13 0600  ceFAZolin (ANCEF) IVPB 2 g/50 mL premix  Status:  Discontinued     2 g 100 mL/hr over 30 Minutes Intravenous On call to O.R. 05/22/13 1742 05/22/13 1754   05/23/13 0600  [MAR Hold]  ceFAZolin (ANCEF) 3 g in dextrose 5 % 50 mL IVPB     (On MAR Hold since 05/23/13 1301)   3 g 160 mL/hr over 30 Minutes Intravenous  60 min pre-op 05/22/13 1755 05/23/13 1549   05/22/13 2200  ceFEPIme (MAXIPIME) 2 g in dextrose 5 % 50 mL IVPB     2 g 100 mL/hr over 30 Minutes Intravenous Every 12 hours 05/22/13 1832     05/22/13 2200  vancomycin (VANCOCIN) 1,500 mg in sodium chloride 0.9 % 500 mL IVPB     1,500 mg 250 mL/hr over 120 Minutes Intravenous Every 12 hours 05/22/13 1832     05/22/13 1845  rifampin (RIFADIN) capsule 300 mg  Status:  Discontinued     300 mg Oral Daily 05/22/13 1832 05/23/13 1947       Patient was given sequential compression devices, early ambulation, and chemoprophylaxis to prevent DVT.  Patient benefited maximally from hospital stay and there were no complications.    Recent vital signs: Patient Vitals for the past 24 hrs:  BP Temp Pulse Resp SpO2  05/30/13 0641 164/92 mmHg 98.8 F (37.1 C) 87 18 100 %  05/30/13 0000 - - - 18 100 %  05/29/13 2201 165/88 mmHg 99.5 F (37.5 C) 92 18 100 %  05/29/13 2000 - - - 18 99 %  05/29/13 1600 - - - 18 99 %  05/29/13 1200 - - - 18 100 %  05/29/13 0936 152/77 mmHg - - - -  05/29/13 0800 - - - 18 100 %     Recent laboratory studies:  Recent  Labs  05/30/13 0539  NA 133*  K 4.1  CL 98  CO2 25  BUN 12  CREATININE 0.92  GLUCOSE 142*  CALCIUM 9.1     Discharge Medications:     Medication List         aspirin EC 325 MG tablet  Take 1 tablet (325 mg total) by mouth 2 (two) times daily.     dextrose 5 % SOLN 50 mL with ceFEPIme 2 G SOLR 2 g  Inject 2 g into the vein every 12 (twelve) hours.     diltiazem 300 MG 24 hr capsule  Commonly known as:  TIAZAC  Take 300 mg by mouth daily.     FARXIGA 5 MG Tabs  Generic drug:  Dapagliflozin Propanediol  Take 5 mg by mouth daily.     insulin glargine 100 UNIT/ML injection  Commonly known as:  LANTUS  Inject 20 Units into the skin at bedtime.     insulin lispro protamine-lispro (75-25) 100 UNIT/ML Susp injection  Commonly known as:  HUMALOG 75/25  Inject 45 Units into the skin daily  with breakfast.     lisinopril 20 MG tablet  Commonly known as:  PRINIVIL,ZESTRIL  Take 20 mg by mouth daily.     methocarbamol 500 MG tablet  Commonly known as:  ROBAXIN  Take 1 tablet (500 mg total) by mouth 2 (two) times daily with a meal.     oxyCODONE-acetaminophen 5-325 MG per tablet  Commonly known as:  ROXICET  Take 1 tablet by mouth every 4 (four) hours as needed for pain.     rifampin 300 MG capsule  Commonly known as:  RIFADIN  Take 1 capsule (300 mg total) by mouth daily.     sodium chloride 0.9 % SOLN 500 mL with vancomycin 10 G SOLR 1,500 mg  Inject 1,500 mg into the vein every 12 (twelve) hours.     tamsulosin 0.4 MG Caps capsule  Commonly known as:  FLOMAX  Take 0.4 mg by mouth daily.        Diagnostic Studies: No results found.  Disposition: 01-Home or Self Care      Discharge Orders   Future Appointments Provider Department Dept Phone   06/11/2013 2:00 PM Ginnie Smart, MD [facility] for Infectious Disease [phone]   Future Orders Complete By Expires   Call MD / Call 911  As directed    Comments:     If you experience chest pain or shortness of breath, CALL 911 and be transported to the hospital emergency room.  If you develope a fever above 101 F, pus (white drainage) or increased drainage or redness at the wound, or calf pain, call your surgeon's office.   Change dressing  As directed    Comments:     Change dressing on 5, then change the dressing daily with sterile 4 x 4 inch gauze dressing and apply TED hose.  You may clean the incision with alcohol prior to redressing.   Constipation Prevention  As directed    Comments:     Drink plenty of fluids.  Prune juice may be helpful.  You may use a stool softener, such as Colace (over the counter) 100 mg twice a day.  Use MiraLax (over the counter) for constipation as needed.   Diet - low sodium heart healthy  As directed    Discharge instructions  As directed    Comments:      Follow up in 7-10  days with Dr. Turner Daniels in office   Driving restrictions  As directed    Comments:     No driving for 2 weeks   Increase activity slowly as tolerated  As directed    Patient may shower  As directed    Comments:     You may shower without a dressing once there is no drainage.  Do not wash over the wound.  If drainage remains, cover wound with plastic wrap and then shower.      Follow-up Information   Follow up with [name], MD.   Specialty:  Orthopedic Surgery   Contact information:   [address] [place] [address] [phone]        Signed: Vear Clock, Julian Medina R 05/30/2013, 7:48 AM

## 2013-05-30 NOTE — Progress Notes (Signed)
Physical Therapy Treatment Patient Details Name: Edwin Shaffer MRN: [id number] DOB: [birth year] Today's Date: 05/30/2013 Time: 7829-5621 PT Time Calculation (min): 10 min  PT Assessment / Plan / Recommendation  History of Present Illness Pt with multiple recent admissions due to infected Lt knee   PT Comments   Pt making steady progress toward his goals.  Follow Up Recommendations  Supervision/Assistance - 24 hour;Home health PT     Equipment Recommendations  None recommended by PT    Recommendations for Other Services OT consult  Frequency Min 5X/week   Progress towards PT Goals Progress towards PT goals: Progressing toward goals  Plan Current plan remains appropriate    Precautions / Restrictions Precautions Precautions: Knee;Fall Precaution Comments: No ROM on Lt knee Required Braces or Orthoses: Knee Immobilizer - Left Knee Immobilizer - Left: On at all times Restrictions Weight Bearing Restrictions: Yes LLE Weight Bearing: Partial weight bearing LLE Partial Weight Bearing Percentage or Pounds: 50%       Mobility  Bed Mobility Bed Mobility: Not assessed Transfers Sit to Stand: 5: Supervision;From toilet;With upper extremity assist;With armrests Stand to Sit: 5: Supervision;To chair/3-in-1;With upper extremity assist;With armrests Ambulation/Gait Ambulation/Gait Assistance: 5: Supervision Ambulation Distance (Feet): 15 Feet Assistive device: Rolling walker Ambulation/Gait Assistance Details: Needed cues for posture,walker use and to maintain 50% PWB on LLE. Gait Pattern: Step-to pattern;Wide base of support;Antalgic Gait velocity: decreased Stairs: Yes Stairs Assistance Details (indicate cue type and reason): Pt adamantley deferred stair practice today, stated he felt comfortable with this from yesterday. did demo correct technique to pt's spouse who will be helping him at home, with emphasis on decr weight bearing on LLE. Stair Management Technique: With  walker;Backwards;Step to pattern Number of Stairs: 1      PT Goals (current goals can now be found in the care plan section)    Visit Information  Last PT Received On: 05/30/13 Assistance Needed: +1 History of Present Illness: Pt with multiple recent admissions due to infected Lt knee       Cognition  Cognition Arousal/Alertness: Awake/alert Behavior During Therapy: WFL for tasks assessed/performed Overall Cognitive Status: Within Functional Limits for tasks assessed       End of Session PT - End of Session Equipment Utilized During Treatment: Gait belt;Left knee immobilizer Activity Tolerance: Patient tolerated treatment well Patient left: in chair;with call bell/phone within reach;with family/visitor present Nurse Communication: Mobility status   GP     Sallyanne Kuster 05/30/2013, 1:18 PM  Sallyanne Kuster, PTA Office- [phone]

## 2013-05-30 NOTE — Progress Notes (Signed)
NURSING PROGRESS NOTE  Edwin Shaffer [id number] Discharge Data: 05/30/2013 3:55 PM Attending Provider: [name], MD PIR:JJOAC,ZYSAYT, MD     [name] to be D/C'd Home per MD order.  Discussed with the patient the After Visit Summary and all questions fully answered. Patient's PICC remained in for home administration of IV abx. All belongings returned to patient for patient to take home.   Last Vital Signs:  Blood pressure 145/76, pulse 78, temperature 98 F (36.7 C), temperature source Oral, resp. rate 16, height 6' 2.41" (1.89 m), weight 126.1 kg (278 lb), SpO2 99.00%.  Discharge Medication List   Medication List         aspirin EC 325 MG tablet  Take 1 tablet (325 mg total) by mouth 2 (two) times daily.     aspirin EC 325 MG tablet  Take 1 tablet (325 mg total) by mouth 2 (two) times daily.     Cefepime-Dextrose 2 GM/50ML Solr  Inject 50 mLs (2 g total) into the vein 2 (two) times daily.     dextrose 5 % SOLN 50 mL with ceFEPIme 2 G SOLR 2 g  Inject 2 g into the vein every 12 (twelve) hours.     diltiazem 300 MG 24 hr capsule  Commonly known as:  TIAZAC  Take 300 mg by mouth daily.     FARXIGA 5 MG Tabs  Generic drug:  Dapagliflozin Propanediol  Take 5 mg by mouth daily.     insulin glargine 100 UNIT/ML injection  Commonly known as:  LANTUS  Inject 20 Units into the skin at bedtime.     insulin lispro protamine-lispro (75-25) 100 UNIT/ML Susp injection  Commonly known as:  HUMALOG 75/25  Inject 45 Units into the skin daily with breakfast.     lisinopril 20 MG tablet  Commonly known as:  PRINIVIL,ZESTRIL  Take 20 mg by mouth daily.     methocarbamol 500 MG tablet  Commonly known as:  ROBAXIN  Take 1 tablet (500 mg total) by mouth 2 (two) times daily with a meal.     oxyCODONE-acetaminophen 5-325 MG per tablet  Commonly known as:  ROXICET  Take 1 tablet by mouth every 4 (four) hours as needed for pain.     oxyCODONE-acetaminophen 5-325 MG per  tablet  Commonly known as:  ROXICET  Take 1 tablet by mouth every 4 (four) hours as needed for pain.     rifampin 300 MG capsule  Commonly known as:  RIFADIN  Take 1 capsule (300 mg total) by mouth daily.     sodium chloride 0.9 % SOLN 500 mL with vancomycin 10 G SOLR 1,500 mg  Inject 1,500 mg into the vein every 12 (twelve) hours.     sodium chloride 0.9 % SOLN 500 mL with vancomycin 10 G SOLR 1,500 mg  Inject 1,500 mg into the vein once.     tamsulosin 0.4 MG Caps capsule  Commonly known as:  FLOMAX  Take 0.4 mg by mouth daily.

## 2013-05-30 NOTE — Progress Notes (Addendum)
PATIENT ID: Edwin Shaffer  MRN: [id number]  DOB/AGE:  October 22, 1945 / 67 y.o.  7 Days Post-Op Procedure(s) (LRB): EXCISIONAL TOTAL KNEE ARTHROPLASTY WITH ANTIBIOTIC SPACERS Left (Left)    PROGRESS NOTE Subjective: Patient is alert, oriented, no Nausea, no Vomiting, yes passing gas, yes Bowel Movement. Taking PO well. Denies SOB, Chest or Calf Pain. Using Incentive Spirometer, PAS in place. Ambulate WBAT Patient reports pain as mild  .    Objective: Vital signs in last 24 hours: Filed Vitals:   05/29/13 2000 05/29/13 2201 05/30/13 0000 05/30/13 0641  BP:  165/88  164/92  Pulse:  92  87  Temp:  99.5 F (37.5 C)  98.8 F (37.1 C)  TempSrc:      Resp: 18 18 18 18   Height:      Weight:      SpO2: 99% 100% 100% 100%      Intake/Output from previous day: I/O last 3 completed shifts: In: 3710 [P.O.:960; IV Piggyback:2750] Out: 3165 [Urine:3000; Drains:165]   Intake/Output this shift:     LABORATORY DATA:  Recent Labs  05/29/13 1649 05/29/13 2211 05/30/13 0539 05/30/13 0644  NA  --   --  133*  --   K  --   --  4.1  --   CL  --   --  98  --   CO2  --   --  25  --   BUN  --   --  12  --   CREATININE  --   --  0.92  --   GLUCOSE  --   --  142*  --   GLUCAP 104* 154*  --  130*  CALCIUM  --   --  9.1  --     Examination: Neurologically intact Neurovascular intact Sensation intact distally Intact pulses distally Dorsiflexion/Plantar flexion intact Incision: no drainage No cellulitis present Compartment soft} Blood and plasma separated in drain indicating minimal recent drainage, drain pulled without difficulty. Assessment:   7 Days Post-Op Procedure(s) (LRB): EXCISIONAL TOTAL KNEE ARTHROPLASTY WITH ANTIBIOTIC SPACERS Left (Left) ADDITIONAL DIAGNOSIS:  Acute Blood Loss Anemia, Hyperglycemia and Hypertension  Plan: PT/OT WBAT DVT Prophylaxis:  SCDx72hrs, ASA 325 mg BID x 2 weeks DISCHARGE PLAN: Home today  DISCHARGE NEEDS: HHPT, HHRN, Walker, 3-in-1 comode seat  and IV Antibiotics  Pt to continue with IV Vanc and IV cefepime     Edwin Shaffer R 05/30/2013, 7:40 AM

## 2013-05-30 NOTE — Discharge Summary (Signed)
Patient ID: Edwin Shaffer MRN: [id number] DOB/AGE: Jan 15, 1946 67 y.o.  Admit date: 05/22/2013 Discharge date: 05/30/2013  Admission Diagnoses:  Principal Problem:   Infection of total left knee replacement   Discharge Diagnoses:  Same  Past Medical History  Diagnosis Date  . Hypertension   . Diabetes mellitus without complication   . Cancer     vocal cord  . Arthritis     Surgeries: Procedure(s): EXCISIONAL TOTAL KNEE ARTHROPLASTY WITH ANTIBIOTIC SPACERS Left on 05/22/2013 - 05/23/2013   Consultants:    Discharged Condition: Improved  Hospital Course: Edwin Shaffer is an 67 y.o. male who was admitted 05/22/2013 for operative treatment ofInfection of total left knee replacement. Patient has severe unremitting pain that affects sleep, daily activities, and work/hobbies. After pre-op clearance the patient was taken to the operating room on 05/22/2013 - 05/23/2013 and underwent  Procedure(s): EXCISIONAL TOTAL KNEE ARTHROPLASTY WITH ANTIBIOTIC SPACERS Left.    Patient was given perioperative antibiotics: Anti-infectives   Start     Dose/Rate Route Frequency Ordered Stop   05/30/13 0000  sodium chloride 0.9 % SOLN 500 mL with vancomycin 10 G SOLR 1,500 mg    Comments:  Pt trough level monitored by home health   1,500 mg 250 mL/hr over 120 Minutes Intravenous  Once 05/30/13 1219     05/30/13 0000  Cefepime-Dextrose 2 GM/50ML SOLR     2 g Intravenous 2 times daily 05/30/13 1219     05/23/13 1622  vancomycin (VANCOCIN) powder  Status:  Discontinued       As needed 05/23/13 1623 05/23/13 1828   05/23/13 1621  tobramycin (NEBCIN) powder  Status:  Discontinued       As needed 05/23/13 1622 05/23/13 1828   05/23/13 1445  tobramycin (NEBCIN) powder 4.8 g  Status:  Discontinued     4.8 g Topical To Surgery 05/23/13 1434 05/23/13 1947   05/23/13 1445  vancomycin (VANCOCIN) powder 4,000 mg  Status:  Discontinued     4,000 mg Other To Surgery 05/23/13 1434 05/23/13 1947   05/23/13 0600   ceFAZolin (ANCEF) IVPB 2 g/50 mL premix  Status:  Discontinued     2 g 100 mL/hr over 30 Minutes Intravenous On call to O.R. 05/22/13 1742 05/22/13 1754   05/23/13 0600  [MAR Hold]  ceFAZolin (ANCEF) 3 g in dextrose 5 % 50 mL IVPB     (On MAR Hold since 05/23/13 1301)   3 g 160 mL/hr over 30 Minutes Intravenous 60 min pre-op 05/22/13 1755 05/23/13 1549   05/22/13 2200  ceFEPIme (MAXIPIME) 2 g in dextrose 5 % 50 mL IVPB     2 g 100 mL/hr over 30 Minutes Intravenous Every 12 hours 05/22/13 1832     05/22/13 2200  vancomycin (VANCOCIN) 1,500 mg in sodium chloride 0.9 % 500 mL IVPB     1,500 mg 250 mL/hr over 120 Minutes Intravenous Every 12 hours 05/22/13 1832     05/22/13 1845  rifampin (RIFADIN) capsule 300 mg  Status:  Discontinued     300 mg Oral Daily 05/22/13 1832 05/23/13 1947       Patient was given sequential compression devices, early ambulation, and chemoprophylaxis to prevent DVT.  Patient benefited maximally from hospital stay and there were no complications.    Recent vital signs: Patient Vitals for the past 24 hrs:  BP Temp Pulse Resp SpO2  05/30/13 0641 164/92 mmHg 98.8 F (37.1 C) 87 18 100 %  05/30/13 0000 - - -  18 100 %  05/29/13 2201 165/88 mmHg 99.5 F (37.5 C) 92 18 100 %  05/29/13 2000 - - - 18 99 %  05/29/13 1600 - - - 18 99 %     Recent laboratory studies:  Recent Labs  05/30/13 0539  NA 133*  K 4.1  CL 98  CO2 25  BUN 12  CREATININE 0.92  GLUCOSE 142*  CALCIUM 9.1     Discharge Medications:     Medication List         aspirin EC 325 MG tablet  Take 1 tablet (325 mg total) by mouth 2 (two) times daily.     aspirin EC 325 MG tablet  Take 1 tablet (325 mg total) by mouth 2 (two) times daily.     Cefepime-Dextrose 2 GM/50ML Solr  Inject 50 mLs (2 g total) into the vein 2 (two) times daily.     dextrose 5 % SOLN 50 mL with ceFEPIme 2 G SOLR 2 g  Inject 2 g into the vein every 12 (twelve) hours.     diltiazem 300 MG 24 hr capsule   Commonly known as:  TIAZAC  Take 300 mg by mouth daily.     FARXIGA 5 MG Tabs  Generic drug:  Dapagliflozin Propanediol  Take 5 mg by mouth daily.     insulin glargine 100 UNIT/ML injection  Commonly known as:  LANTUS  Inject 20 Units into the skin at bedtime.     insulin lispro protamine-lispro (75-25) 100 UNIT/ML Susp injection  Commonly known as:  HUMALOG 75/25  Inject 45 Units into the skin daily with breakfast.     lisinopril 20 MG tablet  Commonly known as:  PRINIVIL,ZESTRIL  Take 20 mg by mouth daily.     methocarbamol 500 MG tablet  Commonly known as:  ROBAXIN  Take 1 tablet (500 mg total) by mouth 2 (two) times daily with a meal.     oxyCODONE-acetaminophen 5-325 MG per tablet  Commonly known as:  ROXICET  Take 1 tablet by mouth every 4 (four) hours as needed for pain.     oxyCODONE-acetaminophen 5-325 MG per tablet  Commonly known as:  ROXICET  Take 1 tablet by mouth every 4 (four) hours as needed for pain.     rifampin 300 MG capsule  Commonly known as:  RIFADIN  Take 1 capsule (300 mg total) by mouth daily.     sodium chloride 0.9 % SOLN 500 mL with vancomycin 10 G SOLR 1,500 mg  Inject 1,500 mg into the vein every 12 (twelve) hours.     sodium chloride 0.9 % SOLN 500 mL with vancomycin 10 G SOLR 1,500 mg  Inject 1,500 mg into the vein once.     tamsulosin 0.4 MG Caps capsule  Commonly known as:  FLOMAX  Take 0.4 mg by mouth daily.        Diagnostic Studies: No results found.  Disposition: 01-Home or Self Care      Discharge Orders   Future Appointments Provider Department Dept Phone   06/11/2013 2:00 PM Ginnie Smart, MD [facility] [phone]   Future Orders Complete By Expires   Call MD / Call 911  As directed    Comments:     If you experience chest pain or shortness of breath, CALL 911 and be transported to the hospital emergency room.  If you develope a fever above 101 F, pus (white drainage)  or increased drainage or  redness at the wound, or calf pain, call your surgeon's office.   Change dressing  As directed    Comments:     Change dressing on 5, then change the dressing daily with sterile 4 x 4 inch gauze dressing and apply TED hose.  You may clean the incision with alcohol prior to redressing.   Constipation Prevention  As directed    Comments:     Drink plenty of fluids.  Prune juice may be helpful.  You may use a stool softener, such as Colace (over the counter) 100 mg twice a day.  Use MiraLax (over the counter) for constipation as needed.   Diet - low sodium heart healthy  As directed    Discharge instructions  As directed    Comments:     Follow up in 7-10 days with Dr. Turner Daniels in office   Driving restrictions  As directed    Comments:     No driving for 2 weeks   Increase activity slowly as tolerated  As directed    Patient may shower  As directed    Comments:     You may shower without a dressing once there is no drainage.  Do not wash over the wound.  If drainage remains, cover wound with plastic wrap and then shower.      Follow-up Information   Follow up with [name], MD.   Specialty:  Orthopedic Surgery   Contact information:   [address] [place] [address] [phone]        Signed: Vear Clock, Dornell Grasmick R 05/30/2013, 1:25 PM

## 2013-05-30 NOTE — Progress Notes (Signed)
OT Cancellation Note  Patient Details Name: Edwin Shaffer MRN: [id number] DOB: [birth year]   Cancelled Treatment:    Reason Eval/Treat Not Completed: Patient at procedure or test/ unavailable, per r.n. Pt. About to get a new picc line placement due to issues with his current one.  Also attempted tx. Session earlier this a.m. And pt. Refused until he "got his pain meds"  Robet Leu, COTA/L 05/30/2013, 1:30 PM

## 2013-06-11 ENCOUNTER — Encounter: Payer: Self-pay | Admitting: Infectious Diseases

## 2013-06-11 ENCOUNTER — Ambulatory Visit (INDEPENDENT_AMBULATORY_CARE_PROVIDER_SITE_OTHER): Payer: BC Managed Care – PPO | Admitting: Infectious Diseases

## 2013-06-11 VITALS — BP 113/68 | HR 92 | Temp 98.4°F | Ht 74.5 in | Wt 277.0 lb

## 2013-06-11 DIAGNOSIS — T8454XD Infection and inflammatory reaction due to internal left knee prosthesis, subsequent encounter: Secondary | ICD-10-CM

## 2013-06-11 DIAGNOSIS — E119 Type 2 diabetes mellitus without complications: Secondary | ICD-10-CM

## 2013-06-11 DIAGNOSIS — Z5189 Encounter for other specified aftercare: Secondary | ICD-10-CM

## 2013-06-11 NOTE — Assessment & Plan Note (Addendum)
He appears to be doing well. Will plan for him to complete anbx on 07-03-13, pic pulled then as well. Will askhim to f/u in 4 weeks for re-assessment.   ESR 101, CRP 36.8 (06-04-13)

## 2013-06-11 NOTE — Assessment & Plan Note (Signed)
Appears to be well controlled.   

## 2013-06-11 NOTE — Progress Notes (Signed)
  Subjective:    Patient ID: Edwin Shaffer, male    DOB: 1946-08-17, 67 y.o.   MRN: [id number]  HPI 67 yo M with hx of DM2 and left total knee arthroplasty on 04/23/2013. He was discharged to rehab where he stayed for 16 days. The day prior to leaving rehab he had staples removed. The following day he noticed a brown discharge. His knee was also red, warm and swollen and he reports feeling feverous with chills, highest self recorded temperature was 98.6. He phoned his doctor who advised him to come into hospital. Abscess and tissue cultures taken from left knee on 05/11/13 which back with no WBC, and no organisms at 3 days. He underwent surgical radical irrigation and debridement of infected total knee arthroplasty with revision of DePuy tibial component on 05/11/13. Tissue and fluid Cx's (-). Vanco started on 05/11/13, cefepime and rifampin added 9-15.  He returns 9-23 and was felt to have not improved with anbx. On 9-23 he had his sutures removed, he states that blood "shot out" of his wound. On 9-24 he underwent removal of his TKR and placement of anbx spacer.  He was continued on vanco/cefepime/rifampin and d/c home on 05-30-13.  No further wound d/c. No f/c. Swelling has been better but not resolved. No increase in heat. Using a walker.  He has not gotten rifampin since d/c from hospital.  Today is day 20.   Review of Systems  Constitutional: Negative for fever, chills, appetite change and unexpected weight change.  Gastrointestinal: Negative for diarrhea and constipation.  Genitourinary: Negative for difficulty urinating.  FSG have been ok: 112-140 No problems with PIC- no d/c, no tenderness.     Objective:   Physical Exam  Constitutional: He appears well-developed and well-nourished.  HENT:  Mouth/Throat: No oropharyngeal exudate.  Eyes: EOM are normal. Pupils are equal, round, and reactive to light.  Neck: Neck supple.  Cardiovascular: Normal rate, regular rhythm and normal heart  sounds.   Pulmonary/Chest: Effort normal and breath sounds normal.  Abdominal: Soft. Bowel sounds are normal. He exhibits no distension. There is no tenderness.  Musculoskeletal:       Legs: Lymphadenopathy:    He has no cervical adenopathy.          Assessment & Plan:

## 2013-06-14 ENCOUNTER — Inpatient Hospital Stay: Payer: BC Managed Care – PPO | Admitting: Infectious Diseases

## 2013-07-05 ENCOUNTER — Other Ambulatory Visit: Payer: Self-pay | Admitting: Infectious Diseases

## 2013-07-05 ENCOUNTER — Other Ambulatory Visit: Payer: Self-pay | Admitting: *Deleted

## 2013-07-05 ENCOUNTER — Telehealth: Payer: Self-pay | Admitting: *Deleted

## 2013-07-05 DIAGNOSIS — T8450XS Infection and inflammatory reaction due to unspecified internal joint prosthesis, sequela: Secondary | ICD-10-CM

## 2013-07-05 MED ORDER — DOXYCYCLINE HYCLATE 100 MG PO TABS: 100.0000 mg | ORAL_TABLET | Freq: Two times a day (BID) | ORAL | Status: DC

## 2013-07-05 NOTE — Telephone Encounter (Signed)
Per Dr. Ninetta Lights, patient was to complete IV antibiotics on 07/03/13.  Pt will be started on oral doxycycline.  Pt has a follow up appointment with Dr. Ninetta Lights on 11/12. Patient notified of appointment and prescription waiting at [facility] in [facility].  [facility] infusion notified of the DC order, confirmed order to pull PICC.  Per Viviann Spare at [facility], he will inform [facility] nursing to complete this.  Andree Coss, RN

## 2013-07-11 ENCOUNTER — Ambulatory Visit (INDEPENDENT_AMBULATORY_CARE_PROVIDER_SITE_OTHER): Payer: BC Managed Care – PPO | Admitting: Infectious Diseases

## 2013-07-11 ENCOUNTER — Encounter: Payer: Self-pay | Admitting: Infectious Diseases

## 2013-07-11 VITALS — BP 157/91 | HR 88 | Temp 98.2°F | Ht 74.5 in | Wt 276.0 lb

## 2013-07-11 DIAGNOSIS — T8454XD Infection and inflammatory reaction due to internal left knee prosthesis, subsequent encounter: Secondary | ICD-10-CM

## 2013-07-11 DIAGNOSIS — Z5189 Encounter for other specified aftercare: Secondary | ICD-10-CM

## 2013-07-11 NOTE — Assessment & Plan Note (Signed)
Will continue him on doxy. I am concerned about the persistent heat, inability to bear weight, elevated inflammatory markers (ESR and CRP). He will see Dr Turner Daniels next week and will defer til his exam whether he needs MRI or CT to further eval. Will see him back in 1 month.

## 2013-07-11 NOTE — Progress Notes (Signed)
  Subjective:    Patient ID: Edwin Shaffer, male    DOB: [birth year], 67 y.o.   MRN: [id number]  HPI 67 yo M with hx of DM2 and left total knee arthroplasty on 04/23/2013. He was discharged to rehab where he stayed for 16 days. The day prior to leaving rehab he had staples removed. The following day he noticed a brown discharge. His knee was also red, warm and swollen and he reports feeling feverous with chills, highest self recorded temperature was 98.6. He phoned his doctor who advised him to come into hospital. Abscess and tissue cultures taken from left knee on 05/11/13 which back with no WBC, and no organisms at 3 days. He underwent surgical radical irrigation and debridement of infected total knee arthroplasty with revision of DePuy tibial component on 05/11/13. Tissue and fluid Cx's (-). Vanco started on 05/11/13, cefepime and rifampin added 9-15.  He returns 9-23 and was not improved with anbx. On 9-23 he had his sutures removed, he states that blood "shot out" of his wound. On 9-24 he underwent removal of his TKR and placement of anbx spacer.  He was continued on vanco/cefepime/rifampin and d/c home on 05-30-13.  06-25-13 ESR 76, CRP 43.3.  He completed IV anbx on 07-03-13 and was transitioned to doxy.  On 07-02-13 his CRP was 25.3, ESR 80 .  Feels like his knee is much better- wound is closed, can't bear wt (painful), swelling unchanged. Has to take doxy with food, "tearing my stomach up".  At last f/u with Dr Turner Daniels had plain films done which "looked good".   Review of Systems     Objective:   Physical Exam  Constitutional: He appears well-developed and well-nourished.  Musculoskeletal:       Legs:         Assessment & Plan:

## 2013-07-17 ENCOUNTER — Other Ambulatory Visit: Payer: Self-pay | Admitting: Orthopedic Surgery

## 2013-07-17 ENCOUNTER — Encounter (HOSPITAL_COMMUNITY): Payer: Self-pay | Admitting: *Deleted

## 2013-07-17 MED ORDER — DEXTROSE 5 % IV SOLN
3.0000 g | INTRAVENOUS | Status: AC
  Administered 2013-07-18: 3 g via INTRAVENOUS
  Filled 2013-07-17: qty 3000
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
## 2013-07-18 ENCOUNTER — Encounter (HOSPITAL_COMMUNITY)
Admission: RE | Disposition: A | Discharge status: Skilled Nursing Facility | Payer: Self-pay | Source: Ambulatory Visit | Attending: Orthopedic Surgery

## 2013-07-18 ENCOUNTER — Encounter (HOSPITAL_COMMUNITY): Payer: Self-pay | Admitting: Anesthesiology

## 2013-07-18 ENCOUNTER — Encounter (HOSPITAL_COMMUNITY): Payer: BC Managed Care – PPO | Admitting: Anesthesiology

## 2013-07-18 ENCOUNTER — Inpatient Hospital Stay (HOSPITAL_COMMUNITY): Payer: BC Managed Care – PPO | Admitting: Anesthesiology

## 2013-07-18 ENCOUNTER — Inpatient Hospital Stay (HOSPITAL_COMMUNITY)
Admission: RE | Admit: 2013-07-18 | Discharge: 2013-07-23 | DRG: 488 | Disposition: A | Discharge status: Skilled Nursing Facility | Payer: BC Managed Care – PPO | Source: Ambulatory Visit | Attending: Orthopedic Surgery | Admitting: Orthopedic Surgery

## 2013-07-18 DIAGNOSIS — S82143A Displaced bicondylar fracture of unspecified tibia, initial encounter for closed fracture: Secondary | ICD-10-CM

## 2013-07-18 DIAGNOSIS — Z96659 Presence of unspecified artificial knee joint: Secondary | ICD-10-CM

## 2013-07-18 DIAGNOSIS — Y849 Medical procedure, unspecified as the cause of abnormal reaction of the patient, or of later complication, without mention of misadventure at the time of the procedure: Secondary | ICD-10-CM | POA: Diagnosis present

## 2013-07-18 DIAGNOSIS — D62 Acute posthemorrhagic anemia: Secondary | ICD-10-CM | POA: Diagnosis not present

## 2013-07-18 DIAGNOSIS — B1920 Unspecified viral hepatitis C without hepatic coma: Secondary | ICD-10-CM

## 2013-07-18 DIAGNOSIS — T8454XA Infection and inflammatory reaction due to internal left knee prosthesis, initial encounter: Secondary | ICD-10-CM

## 2013-07-18 DIAGNOSIS — S82142A Displaced bicondylar fracture of left tibia, initial encounter for closed fracture: Secondary | ICD-10-CM

## 2013-07-18 DIAGNOSIS — M869 Osteomyelitis, unspecified: Secondary | ICD-10-CM | POA: Diagnosis present

## 2013-07-18 DIAGNOSIS — E119 Type 2 diabetes mellitus without complications: Secondary | ICD-10-CM | POA: Diagnosis present

## 2013-07-18 DIAGNOSIS — Z87891 Personal history of nicotine dependence: Secondary | ICD-10-CM

## 2013-07-18 DIAGNOSIS — Z113 Encounter for screening for infections with a predominantly sexual mode of transmission: Secondary | ICD-10-CM

## 2013-07-18 DIAGNOSIS — Z79899 Other long term (current) drug therapy: Secondary | ICD-10-CM

## 2013-07-18 DIAGNOSIS — W010XXA Fall on same level from slipping, tripping and stumbling without subsequent striking against object, initial encounter: Secondary | ICD-10-CM | POA: Diagnosis present

## 2013-07-18 DIAGNOSIS — I10 Essential (primary) hypertension: Secondary | ICD-10-CM | POA: Diagnosis present

## 2013-07-18 DIAGNOSIS — S82143P Displaced bicondylar fracture of unspecified tibia, subsequent encounter for closed fracture with malunion: Secondary | ICD-10-CM

## 2013-07-18 DIAGNOSIS — T8454XS Infection and inflammatory reaction due to internal left knee prosthesis, sequela: Secondary | ICD-10-CM

## 2013-07-18 DIAGNOSIS — T84099A Other mechanical complication of unspecified internal joint prosthesis, initial encounter: Principal | ICD-10-CM | POA: Diagnosis present

## 2013-07-18 HISTORY — DX: Shortness of breath: R06.02

## 2013-07-18 HISTORY — DX: Cardiac murmur, unspecified: R01.1

## 2013-07-18 HISTORY — DX: Constipation, unspecified: K59.00

## 2013-07-18 HISTORY — PX: IRRIGATION AND DEBRIDEMENT KNEE: SHX5185

## 2013-07-18 LAB — CBC
Hemoglobin: 11 g/dL — ABNORMAL LOW (ref 13.0–17.0)
RBC: 3.87 MIL/uL — ABNORMAL LOW (ref 4.22–5.81)
RDW: 14.9 % (ref 11.5–15.5)
WBC: 7.9 10*3/uL (ref 4.0–10.5)

## 2013-07-18 LAB — GLUCOSE, CAPILLARY
Glucose-Capillary: 71 mg/dL (ref 70–99)
Glucose-Capillary: 77 mg/dL (ref 70–99)

## 2013-07-18 LAB — BASIC METABOLIC PANEL
CO2: 23 mEq/L (ref 19–32)
Creatinine, Ser: 1.04 mg/dL (ref 0.50–1.35)
GFR calc Af Amer: 85 mL/min — ABNORMAL LOW (ref >90)
GFR calc non Af Amer: 73 mL/min — ABNORMAL LOW (ref >90)
Potassium: 4.2 mEq/L (ref 3.5–5.1)
Sodium: 139 mEq/L (ref 135–145)

## 2013-07-18 LAB — C-REACTIVE PROTEIN: CRP: 1.8 mg/dL — ABNORMAL HIGH (ref <0.60)

## 2013-07-18 LAB — SEDIMENTATION RATE: Sed Rate: 75 mm/hr — ABNORMAL HIGH (ref 0–16)

## 2013-07-18 SURGERY — IRRIGATION AND DEBRIDEMENT KNEE
Anesthesia: General | Site: Knee | Laterality: Left | Wound class: Dirty or Infected

## 2013-07-18 MED ORDER — VANCOMYCIN HCL 10 G IV SOLR
2500.0000 mg | Freq: Once | INTRAVENOUS | Status: AC
  Administered 2013-07-18: 2500 mg via INTRAVENOUS
  Filled 2013-07-18: qty 2500

## 2013-07-18 MED ORDER — ACETAMINOPHEN 650 MG RE SUPP: 650.0000 mg | Freq: Four times a day (QID) | RECTAL | Status: DC | PRN

## 2013-07-18 MED ORDER — HYDROGEN PEROXIDE 3 % EX SOLN
CUTANEOUS | Status: DC | PRN
  Administered 2013-07-18: 1 via TOPICAL

## 2013-07-18 MED ORDER — ONDANSETRON HCL 4 MG PO TABS: 4.0000 mg | ORAL_TABLET | Freq: Four times a day (QID) | ORAL | Status: DC | PRN

## 2013-07-18 MED ORDER — METHOCARBAMOL 500 MG PO TABS
500.0000 mg | ORAL_TABLET | Freq: Four times a day (QID) | ORAL | Status: DC | PRN
  Administered 2013-07-18 – 2013-07-23 (×9): 500 mg via ORAL
  Filled 2013-07-18 (×9): qty 1

## 2013-07-18 MED ORDER — PHENOL 1.4 % MT LIQD: 1.0000 | OROMUCOSAL | Status: DC | PRN

## 2013-07-18 MED ORDER — GLYCOPYRROLATE 0.2 MG/ML IJ SOLN
INTRAMUSCULAR | Status: DC | PRN
  Administered 2013-07-18: .4 mg via INTRAVENOUS

## 2013-07-18 MED ORDER — LACTATED RINGERS IV SOLN
INTRAVENOUS | Status: DC
  Administered 2013-07-18: 13:00:00 via INTRAVENOUS

## 2013-07-18 MED ORDER — DIPHENHYDRAMINE HCL 12.5 MG/5ML PO ELIX: 12.5000 mg | ORAL_SOLUTION | ORAL | Status: DC | PRN

## 2013-07-18 MED ORDER — ACETAMINOPHEN 325 MG PO TABS: 650.0000 mg | ORAL_TABLET | Freq: Four times a day (QID) | ORAL | Status: DC | PRN

## 2013-07-18 MED ORDER — ROCURONIUM BROMIDE 100 MG/10ML IV SOLN
INTRAVENOUS | Status: DC | PRN
  Administered 2013-07-18: 15 mg via INTRAVENOUS

## 2013-07-18 MED ORDER — PHENYLEPHRINE HCL 10 MG/ML IJ SOLN
10.0000 mg | INTRAVENOUS | Status: DC | PRN
  Administered 2013-07-18: 20 ug/min via INTRAVENOUS

## 2013-07-18 MED ORDER — KCL IN DEXTROSE-NACL 20-5-0.45 MEQ/L-%-% IV SOLN
INTRAVENOUS | Status: AC
  Filled 2013-07-18: qty 1000

## 2013-07-18 MED ORDER — FENTANYL CITRATE 0.05 MG/ML IJ SOLN
INTRAMUSCULAR | Status: DC | PRN
  Administered 2013-07-18: 50 ug via INTRAVENOUS
  Administered 2013-07-18: 100 ug via INTRAVENOUS
  Administered 2013-07-18 (×2): 50 ug via INTRAVENOUS

## 2013-07-18 MED ORDER — NEOSTIGMINE METHYLSULFATE 1 MG/ML IJ SOLN
INTRAMUSCULAR | Status: DC | PRN
  Administered 2013-07-18: 3 mg via INTRAVENOUS

## 2013-07-18 MED ORDER — OXYCODONE HCL 5 MG PO TABS
5.0000 mg | ORAL_TABLET | ORAL | Status: DC | PRN
  Administered 2013-07-18 – 2013-07-23 (×21): 10 mg via ORAL
  Filled 2013-07-18 (×21): qty 2

## 2013-07-18 MED ORDER — ONDANSETRON HCL 4 MG/2ML IJ SOLN: 4.0000 mg | Freq: Once | INTRAMUSCULAR | Status: DC | PRN

## 2013-07-18 MED ORDER — METHOCARBAMOL 100 MG/ML IJ SOLN
500.0000 mg | Freq: Four times a day (QID) | INTRAVENOUS | Status: DC | PRN
  Filled 2013-07-18: qty 5

## 2013-07-18 MED ORDER — CHLORHEXIDINE GLUCONATE 4 % EX LIQD: 60.0000 mL | Freq: Once | CUTANEOUS | Status: DC

## 2013-07-18 MED ORDER — HYDROMORPHONE HCL PF 1 MG/ML IJ SOLN
0.5000 mg | INTRAMUSCULAR | Status: DC | PRN
  Administered 2013-07-20: 0.5 mg via INTRAVENOUS
  Filled 2013-07-18: qty 1

## 2013-07-18 MED ORDER — VANCOMYCIN HCL 1000 MG IV SOLR
INTRAVENOUS | Status: AC
  Filled 2013-07-18: qty 1000

## 2013-07-18 MED ORDER — LACTATED RINGERS IV SOLN
INTRAVENOUS | Status: DC | PRN
  Administered 2013-07-18 (×2): via INTRAVENOUS

## 2013-07-18 MED ORDER — VANCOMYCIN HCL 10 G IV SOLR
1250.0000 mg | Freq: Two times a day (BID) | INTRAVENOUS | Status: DC
  Administered 2013-07-19 – 2013-07-23 (×9): 1250 mg via INTRAVENOUS
  Filled 2013-07-18 (×13): qty 1250

## 2013-07-18 MED ORDER — METOCLOPRAMIDE HCL 10 MG PO TABS: 5.0000 mg | ORAL_TABLET | Freq: Three times a day (TID) | ORAL | Status: DC | PRN

## 2013-07-18 MED ORDER — LIDOCAINE HCL (CARDIAC) 20 MG/ML IV SOLN
INTRAVENOUS | Status: DC | PRN
  Administered 2013-07-18: 80 mg via INTRAVENOUS

## 2013-07-18 MED ORDER — HYDROMORPHONE HCL PF 1 MG/ML IJ SOLN
INTRAMUSCULAR | Status: AC
  Filled 2013-07-18: qty 1

## 2013-07-18 MED ORDER — KCL IN DEXTROSE-NACL 20-5-0.45 MEQ/L-%-% IV SOLN
INTRAVENOUS | Status: DC
  Administered 2013-07-19: 125 mL/h via INTRAVENOUS
  Administered 2013-07-20: 1 mL via INTRAVENOUS
  Administered 2013-07-21 – 2013-07-23 (×7): via INTRAVENOUS
  Filled 2013-07-18 (×18): qty 1000

## 2013-07-18 MED ORDER — 0.9 % SODIUM CHLORIDE (POUR BTL) OPTIME
TOPICAL | Status: DC | PRN
  Administered 2013-07-18: 1000 mL

## 2013-07-18 MED ORDER — DILTIAZEM HCL ER BEADS 300 MG PO CP24
300.0000 mg | ORAL_CAPSULE | Freq: Every day | ORAL | Status: DC
  Administered 2013-07-19 – 2013-07-23 (×5): 300 mg via ORAL
  Filled 2013-07-18 (×5): qty 1

## 2013-07-18 MED ORDER — HYDROMORPHONE HCL PF 1 MG/ML IJ SOLN
0.2500 mg | INTRAMUSCULAR | Status: DC | PRN
  Administered 2013-07-18 (×4): 0.5 mg via INTRAVENOUS

## 2013-07-18 MED ORDER — TAMSULOSIN HCL 0.4 MG PO CAPS
0.4000 mg | ORAL_CAPSULE | Freq: Every day | ORAL | Status: DC
  Administered 2013-07-19 – 2013-07-23 (×5): 0.4 mg via ORAL
  Filled 2013-07-18 (×5): qty 1

## 2013-07-18 MED ORDER — ASPIRIN EC 325 MG PO TBEC
325.0000 mg | DELAYED_RELEASE_TABLET | Freq: Every day | ORAL | Status: DC
  Administered 2013-07-19 – 2013-07-21 (×3): 325 mg via ORAL
  Filled 2013-07-18 (×5): qty 1

## 2013-07-18 MED ORDER — ALUM & MAG HYDROXIDE-SIMETH 200-200-20 MG/5ML PO SUSP: 30.0000 mL | ORAL | Status: DC | PRN

## 2013-07-18 MED ORDER — TOBRAMYCIN SULFATE 1.2 G IJ SOLR
INTRAMUSCULAR | Status: AC
  Filled 2013-07-18: qty 1.2

## 2013-07-18 MED ORDER — SODIUM CHLORIDE 0.9 % IR SOLN
Status: DC | PRN
  Administered 2013-07-18 (×3): 1000 mL

## 2013-07-18 MED ORDER — DOXYCYCLINE HYCLATE 100 MG PO TABS
100.0000 mg | ORAL_TABLET | Freq: Two times a day (BID) | ORAL | Status: DC
  Administered 2013-07-18 – 2013-07-19 (×2): 100 mg via ORAL
  Filled 2013-07-18 (×3): qty 1

## 2013-07-18 MED ORDER — TOBRAMYCIN SULFATE 1.2 G IJ SOLR
INTRAMUSCULAR | Status: DC | PRN
  Administered 2013-07-18: 1.2 g

## 2013-07-18 MED ORDER — BUPIVACAINE-EPINEPHRINE (PF) 0.5% -1:200000 IJ SOLN
INTRAMUSCULAR | Status: AC
  Filled 2013-07-18: qty 10

## 2013-07-18 MED ORDER — DAPAGLIFLOZIN PROPANEDIOL 5 MG PO TABS
5.0000 mg | ORAL_TABLET | Freq: Every day | ORAL | Status: DC
  Administered 2013-07-22: 5 mg via ORAL

## 2013-07-18 MED ORDER — ONDANSETRON HCL 4 MG/2ML IJ SOLN
INTRAMUSCULAR | Status: DC | PRN
  Administered 2013-07-18: 4 mg via INTRAVENOUS

## 2013-07-18 MED ORDER — DEXTROSE-NACL 5-0.45 % IV SOLN: INTRAVENOUS | Status: DC

## 2013-07-18 MED ORDER — METOCLOPRAMIDE HCL 5 MG/ML IJ SOLN: 5.0000 mg | Freq: Three times a day (TID) | INTRAMUSCULAR | Status: DC | PRN

## 2013-07-18 MED ORDER — VANCOMYCIN HCL 1000 MG IV SOLR
INTRAVENOUS | Status: DC | PRN
  Administered 2013-07-18: 1000 mg

## 2013-07-18 MED ORDER — MENTHOL 3 MG MT LOZG
1.0000 | LOZENGE | OROMUCOSAL | Status: DC | PRN
  Filled 2013-07-18 (×2): qty 9

## 2013-07-18 MED ORDER — SUCCINYLCHOLINE CHLORIDE 20 MG/ML IJ SOLN
INTRAMUSCULAR | Status: DC | PRN
  Administered 2013-07-18: 120 mg via INTRAVENOUS

## 2013-07-18 MED ORDER — DOCUSATE SODIUM 100 MG PO CAPS
100.0000 mg | ORAL_CAPSULE | Freq: Two times a day (BID) | ORAL | Status: DC
  Administered 2013-07-18 – 2013-07-23 (×10): 100 mg via ORAL
  Filled 2013-07-18 (×11): qty 1

## 2013-07-18 MED ORDER — SENNOSIDES-DOCUSATE SODIUM 8.6-50 MG PO TABS: 1.0000 | ORAL_TABLET | Freq: Every evening | ORAL | Status: DC | PRN

## 2013-07-18 MED ORDER — MAGNESIUM CITRATE PO SOLN: 1.0000 | Freq: Once | ORAL | Status: AC | PRN

## 2013-07-18 MED ORDER — PHENYLEPHRINE HCL 10 MG/ML IJ SOLN
INTRAMUSCULAR | Status: DC | PRN
  Administered 2013-07-18: 80 ug via INTRAVENOUS
  Administered 2013-07-18 (×2): 40 ug via INTRAVENOUS
  Administered 2013-07-18: 80 ug via INTRAVENOUS
  Administered 2013-07-18: 40 ug via INTRAVENOUS
  Administered 2013-07-18: 80 ug via INTRAVENOUS
  Administered 2013-07-18: 40 ug via INTRAVENOUS

## 2013-07-18 MED ORDER — BISACODYL 5 MG PO TBEC: 5.0000 mg | DELAYED_RELEASE_TABLET | Freq: Every day | ORAL | Status: DC | PRN

## 2013-07-18 MED ORDER — LISINOPRIL 20 MG PO TABS
20.0000 mg | ORAL_TABLET | Freq: Every day | ORAL | Status: DC
  Administered 2013-07-19 – 2013-07-23 (×5): 20 mg via ORAL
  Filled 2013-07-18 (×5): qty 1

## 2013-07-18 MED ORDER — PROPOFOL 10 MG/ML IV BOLUS
INTRAVENOUS | Status: DC | PRN
  Administered 2013-07-18: 200 mg via INTRAVENOUS

## 2013-07-18 MED ORDER — ONDANSETRON HCL 4 MG/2ML IJ SOLN: 4.0000 mg | Freq: Four times a day (QID) | INTRAMUSCULAR | Status: DC | PRN

## 2013-07-18 MED ORDER — INSULIN ASPART 100 UNIT/ML ~~LOC~~ SOLN
0.0000 [IU] | Freq: Three times a day (TID) | SUBCUTANEOUS | Status: DC
  Administered 2013-07-19: 3 [IU] via SUBCUTANEOUS
  Administered 2013-07-19: 4 [IU] via SUBCUTANEOUS
  Administered 2013-07-19 – 2013-07-21 (×6): 3 [IU] via SUBCUTANEOUS
  Administered 2013-07-21: 4 [IU] via SUBCUTANEOUS
  Administered 2013-07-22: 3 [IU] via SUBCUTANEOUS
  Administered 2013-07-22: 4 [IU] via SUBCUTANEOUS
  Administered 2013-07-22 – 2013-07-23 (×2): 3 [IU] via SUBCUTANEOUS
  Administered 2013-07-23: 2 [IU] via SUBCUTANEOUS

## 2013-07-18 SURGICAL SUPPLY — 66 items
BANDAGE ESMARK 6X9 LF (GAUZE/BANDAGES/DRESSINGS) ×1 IMPLANT
BLADE SAW SGTL MED 73X18.5 STR (BLADE) ×1 IMPLANT
BLADE SURG 10 STRL SS (BLADE) ×3 IMPLANT
BNDG CMPR 9X6 STRL LF SNTH (GAUZE/BANDAGES/DRESSINGS) ×1
BNDG CMPR MED 10X6 ELC LF (GAUZE/BANDAGES/DRESSINGS) ×1
BNDG ELASTIC 6X10 VLCR STRL LF (GAUZE/BANDAGES/DRESSINGS) ×1 IMPLANT
BNDG ESMARK 6X9 LF (GAUZE/BANDAGES/DRESSINGS) ×2
BOWL SMART MIX CTS (DISPOSABLE) ×1 IMPLANT
CLOTH BEACON ORANGE TIMEOUT ST (SAFETY) ×1 IMPLANT
CONT SPEC 4OZ CLIKSEAL STRL BL (MISCELLANEOUS) ×3 IMPLANT
COVER BACK TABLE 24X17X13 BIG (DRAPES) IMPLANT
COVER SURGICAL LIGHT HANDLE (MISCELLANEOUS) ×2 IMPLANT
CUFF TOURNIQUET SINGLE 34IN LL (TOURNIQUET CUFF) ×1 IMPLANT
CUFF TOURNIQUET SINGLE 44IN (TOURNIQUET CUFF) IMPLANT
DRAPE EXTREMITY T 121X128X90 (DRAPE) ×2 IMPLANT
DRAPE INCISE IOBAN 66X45 STRL (DRAPES) IMPLANT
DRAPE PROXIMA HALF (DRAPES) ×2 IMPLANT
DRAPE U-SHAPE 47X51 STRL (DRAPES) ×2 IMPLANT
DRSG ADAPTIC 3X8 NADH LF (GAUZE/BANDAGES/DRESSINGS) ×2 IMPLANT
DRSG PAD ABDOMINAL 8X10 ST (GAUZE/BANDAGES/DRESSINGS) ×3 IMPLANT
DURAPREP 26ML APPLICATOR (WOUND CARE) ×3 IMPLANT
ELECT REM PT RETURN 9FT ADLT (ELECTROSURGICAL) ×2
ELECTRODE REM PT RTRN 9FT ADLT (ELECTROSURGICAL) ×1 IMPLANT
EVACUATOR 3/16  PVC DRAIN (DRAIN) ×1
EVACUATOR 3/16 PVC DRAIN (DRAIN) IMPLANT
GLOVE BIO SURGEON STRL SZ7.5 (GLOVE) ×2 IMPLANT
GLOVE BIO SURGEON STRL SZ8.5 (GLOVE) ×3 IMPLANT
GLOVE BIOGEL PI IND STRL 8 (GLOVE) ×1 IMPLANT
GLOVE BIOGEL PI IND STRL 9 (GLOVE) ×1 IMPLANT
GLOVE BIOGEL PI INDICATOR 8 (GLOVE) ×2
GLOVE BIOGEL PI INDICATOR 9 (GLOVE) ×1
GLOVE SURG SS PI 8.0 STRL IVOR (GLOVE) ×1 IMPLANT
GOWN PREVENTION PLUS XLARGE (GOWN DISPOSABLE) ×2 IMPLANT
GOWN STRL NON-REIN LRG LVL3 (GOWN DISPOSABLE) ×2 IMPLANT
GOWN STRL REIN XL XLG (GOWN DISPOSABLE) ×3 IMPLANT
HANDPIECE INTERPULSE COAX TIP (DISPOSABLE) ×2
HOOD PEEL AWAY FACE SHEILD DIS (HOOD) ×2 IMPLANT
IMMOBILIZER KNEE 24 THIGH 36 (MISCELLANEOUS) IMPLANT
IMMOBILIZER KNEE 24 UNIV (MISCELLANEOUS) ×2
KIT BASIN OR (CUSTOM PROCEDURE TRAY) ×2 IMPLANT
KIT ROOM TURNOVER OR (KITS) ×2 IMPLANT
KIT STIMULAN RAPID CURE  10CC (Orthopedic Implant) ×1 IMPLANT
KIT STIMULAN RAPID CURE 10CC (Orthopedic Implant) IMPLANT
MANIFOLD NEPTUNE II (INSTRUMENTS) ×2 IMPLANT
NS IRRIG 1000ML POUR BTL (IV SOLUTION) ×2 IMPLANT
PACK TOTAL JOINT (CUSTOM PROCEDURE TRAY) ×2 IMPLANT
PAD ARMBOARD 7.5X6 YLW CONV (MISCELLANEOUS) ×3 IMPLANT
PADDING CAST COTTON 6X4 STRL (CAST SUPPLIES) ×2 IMPLANT
SET HNDPC FAN SPRY TIP SCT (DISPOSABLE) ×1 IMPLANT
SPONGE GAUZE 4X4 12PLY (GAUZE/BANDAGES/DRESSINGS) ×2 IMPLANT
SPONGE LAP 18X18 X RAY DECT (DISPOSABLE) ×3 IMPLANT
STAPLER VISISTAT 35W (STAPLE) ×2 IMPLANT
SUCTION FRAZIER TIP 10 FR DISP (SUCTIONS) ×1 IMPLANT
SUT ETHILON 2 0 FS 18 (SUTURE) ×2 IMPLANT
SUT VIC AB 0 CT1 27 (SUTURE)
SUT VIC AB 0 CT1 27XBRD ANBCTR (SUTURE) ×1 IMPLANT
SUT VIC AB 1 CTX 36 (SUTURE) ×2
SUT VIC AB 1 CTX36XBRD ANBCTR (SUTURE) ×1 IMPLANT
SUT VIC AB 2-0 CT1 27 (SUTURE) ×4
SUT VIC AB 2-0 CT1 TAPERPNT 27 (SUTURE) ×1 IMPLANT
SYR 20CC LL (SYRINGE) ×2 IMPLANT
TOWEL OR 17X24 6PK STRL BLUE (TOWEL DISPOSABLE) ×2 IMPLANT
TOWEL OR 17X26 10 PK STRL BLUE (TOWEL DISPOSABLE) ×2 IMPLANT
TRAY FOLEY CATH 14FR (SET/KITS/TRAYS/PACK) IMPLANT
TUBE ANAEROBIC SPECIMEN COL (MISCELLANEOUS) ×1 IMPLANT
WATER STERILE IRR 1000ML POUR (IV SOLUTION) ×3 IMPLANT

## 2013-07-18 NOTE — Transfer of Care (Signed)
Immediate Anesthesia Transfer of Care Note  Patient: Edwin Shaffer  Procedure(s) Performed: Procedure(s): IRRIGATION AND DEBRIDEMENT LEFT KNEE AND REMOVAL OF PMMA SPACER (Left)  Patient Location: PACU  Anesthesia Type:General  Level of Consciousness: oriented and patient cooperative  Airway & Oxygen Therapy: Patient Spontanous Breathing and Patient connected to face mask oxygen  Post-op Assessment: Report given to PACU RN and Post -op Vital signs reviewed and stable  Post vital signs: Reviewed and stable  Complications: No apparent anesthesia complications

## 2013-07-18 NOTE — Progress Notes (Signed)
ANTIBIOTIC CONSULT NOTE - INITIAL  Pharmacy Consult for vancomycin Indication: infected knee  No Known Allergies  Patient Measurements:   Body Weight: 125 kg  Vital Signs: Temp: 97.7 F (36.5 C) (11/19 1911) Temp src: Oral (11/19 1247) BP: 143/80 mmHg (11/19 1911) Pulse Rate: 79 (11/19 1911) Intake/Output from previous day:   Intake/Output from this shift:    Labs:  Recent Labs  07/18/13 1242  WBC 7.9  HGB 11.0*  PLT 351  CREATININE 1.04   The CrCl is unknown because both a height and weight (above a minimum accepted value) are required for this calculation. No results found for this basename: VANCOTROUGH, VANCOPEAK, VANCORANDOM, GENTTROUGH, GENTPEAK, GENTRANDOM, TOBRATROUGH, TOBRAPEAK, TOBRARND, AMIKACINPEAK, AMIKACINTROU, AMIKACIN,  in the last 72 hours   Microbiology: No results found for this or any previous visit (from the past 720 hour(s)).  Medical History: Past Medical History  Diagnosis Date  . Hypertension   . Diabetes mellitus without complication   . Cancer     vocal cord  . Arthritis   . Constipation   . Heart murmur   . Shortness of breath     with exertion    Medications:  Prescriptions prior to admission  Medication Sig Dispense Refill  . aspirin EC 325 MG tablet Take 1 tablet (325 mg total) by mouth 2 (two) times daily.  30 tablet  0  . Dapagliflozin Propanediol (FARXIGA) 5 MG TABS Take 5 mg by mouth daily.      Marland Kitchen diltiazem (TIAZAC) 300 MG 24 hr capsule Take 300 mg by mouth daily.      Marland Kitchen doxycycline (VIBRA-TABS) 100 MG tablet Take 1 tablet (100 mg total) by mouth 2 (two) times daily.  120 tablet  0  . insulin glargine (LANTUS) 100 UNIT/ML injection Inject 20 Units into the skin at bedtime.      . insulin lispro protamine-lispro (HUMALOG 75/25) (75-25) 100 UNIT/ML SUSP injection Inject 45 Units into the skin daily with breakfast.      . lisinopril (PRINIVIL,ZESTRIL) 20 MG tablet Take 20 mg by mouth daily.      . methocarbamol (ROBAXIN) 500  MG tablet Take 1 tablet (500 mg total) by mouth 2 (two) times daily with a meal.  60 tablet  0  . oxyCODONE-acetaminophen (ROXICET) 5-325 MG per tablet Take 1 tablet by mouth every 4 (four) hours as needed for pain.  60 tablet  0  . tamsulosin (FLOMAX) 0.4 MG CAPS capsule Take 0.4 mg by mouth daily.        Assessment: 67 year old man s/p surgery today for removal of a knee spacer and irrigation and debridement with placement of drains.  Cultures obtained intra-operatively. Timeline:  04/23/13 - original knee arthroplasty 9/24 - TKR removed and antibiotic spacer placed 11/4 - IV antibiotics stopped - changed to oral doxycycline   Goal of Therapy:  Vancomycin trough level 15-20 mcg/ml  Plan:  Measure antibiotic drug levels at steady state Follow up culture results Vancomycin 2.5g IV x 1 dose, then 1250mg  IV q12h Follow renal function  Mickeal Skinner 07/18/2013,7:54 PM

## 2013-07-18 NOTE — Op Note (Signed)
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  Procedure: Left knee removal of antibiotic impregnated spacer, debridement of medial tibial plateau fracture and radical debridement of soft tissue around previous total knee site. Closure over deep drains with application of knee immobilizer in the hopes of obtaining pseudarthrosis or knee effusion, with resolution of infection  Surgeon: [name], MD  Assistant: Tomi Likens. Gaylene Brooks  (present throughout entire procedure and necessary for timely completion of the procedure)  Anesthesia: General  EBL: 300 cc  Fluids: 1800 cc  Tourniquet Time: 55 minutes and then a second period of 30 minutes  Indications: Status post primary left total by me on 04/23/2013, with swelling pain and drainage noted shortly after removal of staples 2 weeks later. Taken for radical irrigation and debridement with polyethylene bearing exchange 05/11/2024 and unfortunately the infection recurred although cultures were and have remained negative x6 so far. Again the infection recurred and the implants were removed on 05/24/2013 with placement of an antibiotic impregnated spacer and continuation of IV antibiotics. Initially did well came off of IV antibiotics last week and had a near falling episode with partial dislodgment of the spacer. In the office this week has significant swelling warmth and pain and is now taken for removal of the antibiotic impregnated spacer debridement of infected and devitalized tissue, placement of deep drains, stimulant beads, and then a knee immobilizer in the hopes of obtaining a noninfected pseudarthrosis or if we're fortunate a fusion. Risks and benefits of surgery discussed at length with the  patient and his wife.  Procedure: Patient was identified by arm band and taken to operating room 7 at [facility]. Appropriate anesthetic monitors were attached and general endotracheal anesthesia induced with the patient in the supine position. Tourniquet was applied high the left thigh and the left lower Chumley prepped and draped in the usual sterile fashion with the limb elevated to drain blood from the limb. A timeout procedure was performed. The foot and calf were then wrapped with an Esmarch bandage the tourniquet inflated to 350 mm of mercury and we began the procedure by recreating the old anterior midline incision 25-30 cm in length. The tissue was noted to be brawny and edema this. We then made a medial parapatellar arthrotomy and encountered serous sanguinous fluid and more brawny edema of the synovium was some necrotic tissue. Radical irrigation and debridement was then accomplished with a #10 blade, sharp curettes, sharp Rogers, and electrocautery. Once radical synovectomy had been accomplished anteriorly we noticed of the medial tibial plateau was fractured along the rim and necrotic bone was removed. The cement bearing was then split with a half inch wide osteotome in multiple planes and removed and we continued to radical synovectomy medially laterally and posteriorly. In order to have the knee in a slight amount of valgus and oscillating saw was used to remove 5 mm of bone from the lateral side of the tibial plateau and 1-2 mm of bone from the lateral femoral condyle. Satisfied with the radical irrigation and debridement pulse lavage irrigation alternating with hydrogen peroxide was then accomplished. At this point a tourniquet was let down and bleeders were identified and cauterized. After this had been accomplished the limb was once again wrapped with an  Esmarch bandage, the tourniquet inflated and we irrigated one more time for. Deep drains were placed in the wound and then a double  batch of large stimulant beads loaded with vancomycin and tobramycin were placed in the wound as well. We then closed in layers using #1 Vicryl suture and the arthrotomy, 2-0 Vicryl suture and the subcutaneous tissue, wide spaced staples in the skin. A dressing of 4 x 4's, 6 inch and 4 inch web roll, a large a six-inch Ace wrap and a contoured 24 inch knee immobilizer was then applied. At this point a tourniquet was let down and minimal bleeding was noted.

## 2013-07-18 NOTE — Anesthesia Procedure Notes (Signed)
Procedure Name: Intubation Date/Time: 07/18/2013 3:17 PM Performed by: Lovie Chol Pre-anesthesia Checklist: Patient identified, Emergency Drugs available, Suction available, Patient being monitored and Timeout performed Patient Re-evaluated:Patient Re-evaluated prior to inductionOxygen Delivery Method: Circle system utilized Preoxygenation: Pre-oxygenation with 100% oxygen Intubation Type: IV induction Ventilation: Mask ventilation without difficulty Laryngoscope Size: Miller and 3 Grade View: Grade I Tube type: Oral Tube size: 7.5 mm Number of attempts: 1 Airway Equipment and Method: Stylet Placement Confirmation: ETT inserted through vocal cords under direct vision,  positive ETCO2,  CO2 detector and breath sounds checked- equal and bilateral Secured at: 22 cm Tube secured with: Tape Dental Injury: Teeth and Oropharynx as per pre-operative assessment

## 2013-07-18 NOTE — Anesthesia Preprocedure Evaluation (Signed)
Anesthesia Evaluation  Patient identified by MRN, date of birth, ID band Patient awake    Reviewed: Allergy & Precautions, H&P , NPO status , Patient's Chart, lab work & pertinent test results  Airway       Dental   Pulmonary former smoker,          Cardiovascular hypertension,     Neuro/Psych    GI/Hepatic   Endo/Other  diabetes, Type 2, Insulin Dependent  Renal/GU      Musculoskeletal   Abdominal   Peds  Hematology   Anesthesia Other Findings   Reproductive/Obstetrics                           Anesthesia Physical Anesthesia Plan  ASA: III  Anesthesia Plan: General   Post-op Pain Management:    Induction: Intravenous  Airway Management Planned: LMA and Oral ETT  Additional Equipment:   Intra-op Plan:   Post-operative Plan: Extubation in OR  Informed Consent: I have reviewed the patients History and Physical, chart, labs and discussed the procedure including the risks, benefits and alternatives for the proposed anesthesia with the patient or authorized representative who has indicated his/her understanding and acceptance.     Plan Discussed with: CRNA, Anesthesiologist and Surgeon  Anesthesia Plan Comments:         Anesthesia Quick Evaluation

## 2013-07-18 NOTE — Interval H&P Note (Signed)
History and Physical Interval Note:  07/18/2013 2:08 PM  Edwin Shaffer  has presented today for surgery, with the diagnosis of left knee osteomylitis  The various methods of treatment have been discussed with the patient and family. After consideration of risks, benefits and other options for treatment, the patient has consented to  Procedure(s): IRRIGATION AND DEBRIDEMENT LEFT KNEE AND REMOVAL OF PMMA SPACER (Left) as a surgical intervention .  The patient's history has been reviewed, patient examined, no change in status, stable for surgery.  I have reviewed the patient's chart and labs.  Questions were answered to the patient's satisfaction.     [name]

## 2013-07-19 DIAGNOSIS — E119 Type 2 diabetes mellitus without complications: Secondary | ICD-10-CM

## 2013-07-19 DIAGNOSIS — T8450XA Infection and inflammatory reaction due to unspecified internal joint prosthesis, initial encounter: Secondary | ICD-10-CM

## 2013-07-19 DIAGNOSIS — S82143A Displaced bicondylar fracture of unspecified tibia, initial encounter for closed fracture: Secondary | ICD-10-CM

## 2013-07-19 DIAGNOSIS — Z96659 Presence of unspecified artificial knee joint: Secondary | ICD-10-CM

## 2013-07-19 DIAGNOSIS — I10 Essential (primary) hypertension: Secondary | ICD-10-CM

## 2013-07-19 LAB — CBC
HCT: 26.9 % — ABNORMAL LOW (ref 39.0–52.0)
Hemoglobin: 8.8 g/dL — ABNORMAL LOW (ref 13.0–17.0)
MCH: 27.8 pg (ref 26.0–34.0)
MCHC: 32.7 g/dL (ref 30.0–36.0)
MCV: 84.9 fL (ref 78.0–100.0)
RDW: 14.9 % (ref 11.5–15.5)

## 2013-07-19 MED ORDER — SODIUM CHLORIDE 0.9 % IV SOLN
1.0000 g | Freq: Three times a day (TID) | INTRAVENOUS | Status: DC
  Administered 2013-07-19 – 2013-07-23 (×12): 1 g via INTRAVENOUS
  Filled 2013-07-19 (×18): qty 1

## 2013-07-19 NOTE — Evaluation (Signed)
Physical Therapy Evaluation Patient Details Name: Edwin Shaffer MRN: [id number] DOB: Jan 05, 1946 Today's Date: 07/19/2013 Time: 3086-5784 PT Time Calculation (min): 14 min  PT Assessment / Plan / Recommendation History of Present Illness  Pt with multiple recent admissions due to infected Lt knee.  Now s/p removal of spacer and I&D on 07/18/13.  Clinical Impression  Patient presents with problems listed below.  Will benefit from acute PT to maximize independence prior to discharge.  Recommend ST-SNF for continued therapy at discharge before returning home with wife.    PT Assessment  Patient needs continued PT services    Follow Up Recommendations  SNF    Does the patient have the potential to tolerate intense rehabilitation      Barriers to Discharge        Equipment Recommendations  None recommended by PT    Recommendations for Other Services     Frequency Min 5X/week    Precautions / Restrictions Precautions Precautions: Fall Required Braces or Orthoses: Knee Immobilizer - Left Knee Immobilizer - Left: On at all times (NO flexion of Lt knee allowed (no joint)) Restrictions Weight Bearing Restrictions: Yes LLE Weight Bearing: Partial weight bearing LLE Partial Weight Bearing Percentage or Pounds: 10 lbs Other Position/Activity Restrictions: No ROM/knee flexion of Lt knee - no joint   Pertinent Vitals/Pain Pain limiting mobility      Mobility  Bed Mobility Bed Mobility: Supine to Sit;Sitting - Scoot to Edge of Bed Supine to Sit: 5: Supervision Sitting - Scoot to Edge of Bed: 5: Supervision Details for Bed Mobility Assistance: Supervision for safety only. Transfers Transfers: Sit to Stand;Stand to Sit Sit to Stand: 4: Min assist;With upper extremity assist;From bed Stand to Sit: 4: Min assist;With upper extremity assist;With armrests;To chair/3-in-1 Details for Transfer Assistance: Verbal cues for hand placement and technique.  Cues for maintaining PWB on LLE  - instructed patient to perform TDWB to limit weight on LLE. Ambulation/Gait Ambulation/Gait Assistance: 1: +2 Total assist Ambulation/Gait: Patient Percentage: 70% Ambulation Distance (Feet): 15 Feet Assistive device: Rolling walker Ambulation/Gait Assistance Details: Verbal cues for safe use of RW and gait sequence.  Cues to maintain minimal weight through LLE.   Gait Pattern: Step-to pattern;Decreased stance time - left;Decreased step length - right;Antalgic Gait velocity: Slow gait speed    Exercises     PT Diagnosis: Difficulty walking;Abnormality of gait;Acute pain  PT Problem List: Decreased strength;Decreased range of motion;Decreased activity tolerance;Decreased balance;Decreased mobility;Decreased knowledge of use of DME;Decreased safety awareness;Decreased knowledge of precautions;Pain PT Treatment Interventions: DME instruction;Gait training;Functional mobility training;Patient/family education     PT Goals(Current goals can be found in the care plan section) Acute Rehab PT Goals Patient Stated Goal: To be able to walk good before going home PT Goal Formulation: With patient/family Time For Goal Achievement: 08/02/13 Potential to Achieve Goals: Good  Visit Information  Last PT Received On: 07/19/13 Assistance Needed: +1 (+1 for chair) History of Present Illness: Pt with multiple recent admissions due to infected Lt knee.  Now s/p removal of spacer and I&D on 07/18/13.       Prior Functioning  Home Living Family/patient expects to be discharged to:: Skilled nursing facility Home Equipment: Dan Humphreys - 2 wheels;Bedside commode Prior Function Level of Independence: Independent with assistive device(s) Communication Communication: No difficulties Dominant Hand: Left    Cognition  Cognition Arousal/Alertness: Awake/alert Behavior During Therapy: Anxious Overall Cognitive Status: History of cognitive impairments - at baseline Area of Impairment:  Safety/judgement General Comments: Patient with decreased safety  awareness.  Patient fell at home pta.    Extremity/Trunk Assessment Upper Extremity Assessment Upper Extremity Assessment: Overall WFL for tasks assessed Lower Extremity Assessment Lower Extremity Assessment: LLE deficits/detail LLE Deficits / Details: Decreased strength and ROM due to surgery/pain/immobilization LLE: Unable to fully assess due to pain;Unable to fully assess due to immobilization Cervical / Trunk Assessment Cervical / Trunk Assessment: Normal   Balance    End of Session PT - End of Session Equipment Utilized During Treatment: Gait belt;Left knee immobilizer Activity Tolerance: Patient limited by fatigue;Patient limited by pain Patient left: in chair;with call bell/phone within reach;with family/visitor present Nurse Communication: Mobility status  GP     Vena Austria 07/19/2013, 4:14 PM Durenda Hurt. Renaldo Fiddler, [facility] Acute Rehab Services Pager [phone]

## 2013-07-19 NOTE — Progress Notes (Signed)
Patient ID: Edwin Shaffer, male   DOB: February 08, 1946, 67 y.o.   MRN: [id number] PATIENT ID: Edwin Shaffer  MRN: [id number]  DOB/AGE:  [birth year] / 67 y.o.  1 Day Post-Op Procedure(s) (LRB): IRRIGATION AND DEBRIDEMENT LEFT KNEE AND REMOVAL OF PMMA SPACER (Left)    PROGRESS NOTE Subjective: Patient is alert, oriented, no Nausea, no Vomiting, yes passing gas, no Bowel Movement. Taking PO well. Only put out 400 cc urine last night, will in and out cath today, patient reports that once he is up standing a disease he for him to pass urine. Denies SOB, Chest or Calf Pain. Using Incentive Spirometer, PAS in place. Ambulate 10-20 pounds weight bearing on left lower trembly must stay in knee immobilizer 24 hours with straps snug, since his knee joint itself has been resected. Patient has had 4 operations in attempt to control infection after total knee that was done a few months ago, Gram stain and cultures from yesterday negative we have never grown an organism Patient reports pain as 5 on 0-10 scale  .    Objective: Vital signs in last 24 hours: Filed Vitals:   07/18/13 1848 07/18/13 1911 07/19/13 0201 07/19/13 0636  BP:  143/80 151/89 146/81  Pulse: 80 79 94 93  Temp:  97.7 F (36.5 C) 98.6 F (37 C) 99.2 F (37.3 C)  TempSrc:   Oral Oral  Resp: 14 15 16 16   SpO2: 98% 96% 98% 97%      Intake/Output from previous day: I/O last 3 completed shifts: In: 2575 [P.O.:600; I.V.:1975] Out: 600 [Urine:200; Drains:350; Blood:50]   Intake/Output this shift:     LABORATORY DATA:  Recent Labs  07/18/13 1242  07/18/13 1816 07/18/13 2138 07/19/13 0419 07/19/13 0620  WBC 7.9  --   --   --  8.5  --   HGB 11.0*  --   --   --  8.8*  --   HCT 33.0*  --   --   --  26.9*  --   PLT 351  --   --   --  336  --   NA 139  --   --   --   --   --   K 4.2  --   --   --   --   --   CL 103  --   --   --   --   --   CO2 23  --   --   --   --   --   BUN 14  --   --   --   --   --   CREATININE 1.04  --   --    --   --   --   GLUCOSE 74  --   --   --   --   --   GLUCAP  --   < > 77 114*  --  136*  CALCIUM 9.4  --   --   --   --   --   < > = values in this interval not displayed.  Examination: Neurologically intact ABD soft Neurovascular intact Sensation intact distally Intact pulses distally Dorsiflexion/Plantar flexion intact Incision: moderate drainage No cellulitis present Compartment soft} Patient had drainage through the inferior aspect a dressing last night, dressing taken down today and no active bleeding new dressing of 4 x 4's Ace wrap applied towel placed behind the calf to prevent pressure on the skin and knee immobilizer reapplied  today. Assessment:   1 Day Post-Op Procedure(s) (LRB): IRRIGATION AND DEBRIDEMENT LEFT KNEE AND REMOVAL OF PMMA SPACER (Left) ADDITIONAL DIAGNOSIS:  Diabetes and Hypertension  Plan: PT, 10-20 pounds weight bearing on the left lower trembly, knee immobilizer with leg extended at all times do not allow any flexion of the leg as there is no knee joint.  DVT Prophylaxis:  SCDx72hrs, ASA 325 mg BID x 2 weeks DISCHARGE PLAN: Skilled Nursing Facility/Rehab,, patient prefers [name] available DISCHARGE NEEDS: HHPT, HHRN, Walker, 3-in-1 comode seat and IV Antibiotics antibiotics will be selected by infectious disease consultants as patient is now had 7 sets of cultures since his total knee in no growth.      Juliene Kirsh J 07/19/2013, 9:27 AM

## 2013-07-19 NOTE — Care Management Utilization Note (Signed)
Utilization review completed. Doralyn Kirkes, RN BSN 
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
## 2013-07-19 NOTE — Progress Notes (Signed)
ANTIBIOTIC CONSULT NOTE - FOLLOW UP  Pharmacy Consult for Vancomycin and Meropenem Indication:  Infected left knee  No Known Allergies  Patient Measurements: Height: 6' 2.5" (189.2 cm) Weight: 276 lb (125.193 kg) IBW/kg (Calculated) : 83.35  Vital Signs: Temp: 98.9 F (37.2 C) (11/20 1327) Temp src: Oral (11/20 1327) BP: 161/87 mmHg (11/20 1327) Pulse Rate: 100 (11/20 1327) Intake/Output from previous day: 11/19 0701 - 11/20 0700 In: 2575 [P.O.:600; I.V.:1975] Out: 600 [Urine:200; Drains:350; Blood:50]  Labs:  Recent Labs  07/18/13 1242 07/19/13 0419  WBC 7.9 8.5  HGB 11.0* 8.8*  PLT 351 336  CREATININE 1.04  --    Estimated Creatinine Clearance: 100.3 ml/min (by C-G formula based on Cr of 1.04).  Microbiology: Recent Results (from the past 720 hour(s))  TISSUE CULTURE     Status: None   Collection Time    07/18/13  3:39 PM      Result Value Range Status   Specimen Description TISSUE LEFT KNEE   Final   Special Requests SYNOVIUM LEFT KNEE PT ON CEFOMYCIN RIFAMPIN   Final   Gram Stain     Final   Value: NO WBC SEEN     NO ORGANISMS SEEN     Performed at [facility]   Culture     Final   Value: NO GROWTH 1 DAY     Performed at [facility]   Report Status PENDING   Incomplete  ANAEROBIC CULTURE     Status: None   Collection Time    07/18/13  3:39 PM      Result Value Range Status   Specimen Description TISSUE LEFT KNEE   Final   Special Requests SYNOVIUM LEFT KNEE PT ON CEFOMYCIN RIFAMPIN   Final   Gram Stain     Final   Value: NO WBC SEEN     NO ORGANISMS SEEN     Performed at [facility]   Culture     Final   Value: NO ANAEROBES ISOLATED; CULTURE IN PROGRESS FOR 5 DAYS     Performed at [facility]   Report Status PENDING   Incomplete  FUNGUS CULTURE W SMEAR     Status: None   Collection Time    07/18/13  3:39 PM      Result Value Range Status   Specimen Description TISSUE LEFT KNEE   Final   Special Requests  SYNOVIUM LEFT KNEE PT ON CEFOMYCIN RIFAMPIN   Final   Fungal Smear     Final   Value: NO YEAST OR FUNGAL ELEMENTS SEEN     Performed at [facility]   Culture     Final   Value: CULTURE IN PROGRESS FOR FOUR WEEKS     Performed at [facility]   Report Status PENDING   Incomplete   Assessment:   POD#1 I&D of left knee and removal of spacer.   Vancomycin begun last night: adding Meropenem today.  Goal of Therapy:  Vancomycin trough level 15-20 mcg/ml appropriate Meropenem dose for renal function and infection  Plan:    Continue Vancomycin 1250 mg IV q12hrs.   Begin Meropenem 1 gram IV q8hrs.   Will follow renal function and culture data.   Will plan to check Vancomycin trough on 11/22.  Dennie Fetters, RPh Pager: [phone] 07/19/2013,2:53 PM

## 2013-07-19 NOTE — Anesthesia Postprocedure Evaluation (Signed)
  Anesthesia Post-op Note  Patient: [name]  Procedure(s) Performed: Procedure(s): IRRIGATION AND DEBRIDEMENT LEFT KNEE AND REMOVAL OF PMMA SPACER (Left)  Patient Location: PACU  Anesthesia Type:General  Level of Consciousness: awake, oriented, sedated and patient cooperative  Airway and Oxygen Therapy: Patient Spontanous Breathing  Post-op Pain: moderate  Post-op Assessment: Post-op Vital signs reviewed, Patient's Cardiovascular Status Stable, Respiratory Function Stable, Patent Airway, No signs of Nausea or vomiting and Pain level controlled  Post-op Vital Signs: stable  Complications: No apparent anesthesia complications

## 2013-07-20 ENCOUNTER — Encounter (HOSPITAL_COMMUNITY): Payer: Self-pay | Admitting: Orthopedic Surgery

## 2013-07-20 LAB — CBC
HCT: 24.5 % — ABNORMAL LOW (ref 39.0–52.0)
Hemoglobin: 8 g/dL — ABNORMAL LOW (ref 13.0–17.0)
MCH: 27.9 pg (ref 26.0–34.0)
MCV: 85.4 fL (ref 78.0–100.0)
Platelets: 281 10*3/uL (ref 150–400)
RBC: 2.87 MIL/uL — ABNORMAL LOW (ref 4.22–5.81)
WBC: 8.4 10*3/uL (ref 4.0–10.5)

## 2013-07-20 LAB — GLUCOSE, CAPILLARY
Glucose-Capillary: 136 mg/dL — ABNORMAL HIGH (ref 70–99)
Glucose-Capillary: 132 mg/dL — ABNORMAL HIGH (ref 70–99)

## 2013-07-20 MED ORDER — SODIUM CHLORIDE 0.9 % IJ SOLN: 10.0000 mL | INTRAMUSCULAR | Status: DC | PRN

## 2013-07-20 NOTE — Progress Notes (Signed)
1720Peripherally Inserted Central Catheter/Midline Placement  The IV Nurse has discussed with the patient and/or persons authorized to consent for the patient, the purpose of this procedure and the potential benefits and risks involved with this procedure.  The benefits include less needle sticks, lab draws from the catheter and patient may be discharged home with the catheter.  Risks include, but not limited to, infection, bleeding, blood clot (thrombus formation), and puncture of an artery; nerve damage and irregular heat beat.  Alternatives to this procedure were also discussed.  PICC/Midline Placement Documentation  PICC / Midline Single Lumen 05/30/13 PICC Right Basilic (Active)     PICC / Midline Single Lumen 07/20/13 PICC Left Brachial 49 cm 1 cm (Active)  Indication for Insertion or Continuance of Line Home intravenous therapies (PICC only) 07/20/2013  5:45 PM  Exposed Catheter (cm) 1 cm 07/20/2013  5:45 PM  Dressing Change Due 07/27/13 07/20/2013  5:45 PM       Stacie Glaze Horton 07/20/2013, 5:59 PM

## 2013-07-20 NOTE — Progress Notes (Addendum)
[facility]  Day #2 meropenem  Day #2 vancomycin  Subjective: No new complaints   Antibiotics:  Anti-infectives   Start     Dose/Rate Route Frequency Ordered Stop   07/19/13 1600  meropenem (MERREM) 1 g in sodium chloride 0.9 % 100 mL IVPB     1 g 200 mL/hr over 30 Minutes Intravenous Every 8 hours 07/19/13 1452     07/19/13 0600  vancomycin (VANCOCIN) 1,250 mg in sodium chloride 0.9 % 250 mL IVPB     1,250 mg 166.7 mL/hr over 90 Minutes Intravenous Every 12 hours 07/18/13 1954     07/18/13 2200  doxycycline (VIBRA-TABS) tablet 100 mg  Status:  Discontinued     100 mg Oral 2 times daily 07/18/13 1940 07/19/13 1431   07/18/13 2045  vancomycin (VANCOCIN) 2,500 mg in sodium chloride 0.9 % 500 mL IVPB     2,500 mg 250 mL/hr over 120 Minutes Intravenous  Once 07/18/13 1954 07/19/13 0059   07/18/13 1549  vancomycin (VANCOCIN) powder  Status:  Discontinued       As needed 07/18/13 1549 07/18/13 1729   07/18/13 1548  tobramycin (NEBCIN) powder  Status:  Discontinued       As needed 07/18/13 1549 07/18/13 1729   07/18/13 0600  ceFAZolin (ANCEF) 3 g in dextrose 5 % 50 mL IVPB     3 g 160 mL/hr over 30 Minutes Intravenous On call to O.R. 07/17/13 2106 07/18/13 1519      Medications: Scheduled Meds: . aspirin EC  325 mg Oral Q breakfast  . Dapagliflozin Propanediol  5 mg Oral Daily  . diltiazem  300 mg Oral Daily  . docusate sodium  100 mg Oral BID  . insulin aspart  0-20 Units Subcutaneous TID WC  . lisinopril  20 mg Oral Daily  . meropenem (MERREM) IV  1 g Intravenous Q8H  . tamsulosin  0.4 mg Oral Daily  . vancomycin  1,250 mg Intravenous Q12H   Continuous Infusions: . dextrose 5 % and 0.45 % NaCl with KCl 20 mEq/L 125 mL/hr (07/19/13 1021)   PRN Meds:.acetaminophen, acetaminophen, alum & mag hydroxide-simeth, bisacodyl, diphenhydrAMINE, HYDROmorphone (DILAUDID) injection, menthol-cetylpyridinium, methocarbamol (ROBAXIN) IV, methocarbamol,  metoCLOPramide (REGLAN) injection, metoCLOPramide, ondansetron (ZOFRAN) IV, ondansetron, oxyCODONE, phenol, senna-docusate   Objective: Weight change:   Intake/Output Summary (Last 24 hours) at 07/20/13 1438 Last data filed at 07/20/13 0639  Gross per 24 hour  Intake      0 ml  Output    775 ml  Net   -775 ml   Blood pressure 156/78, pulse 92, temperature 97.9 F (36.6 C), temperature source Oral, resp. rate 18, height 6' 2.5" (1.892 m), weight 276 lb (125.193 kg), SpO2 98.00%. Temp:  [97.6 F (36.4 C)-97.9 F (36.6 C)] 97.9 F (36.6 C) (11/21 1318) Pulse Rate:  [82-92] 92 (11/21 1318) Resp:  [18] 18 (11/21 1318) BP: (152-159)/(78-90) 156/78 mmHg (11/21 1318) SpO2:  [98 %-99 %] 98 % (11/21 1318)  Physical Exam: General: Alert and awake, oriented x3, not in any acute distress dysphoric HEENT: anicteric sclera, pupils reactive to light and accommodation, EOMI CVS regular rate, normal r,   Chest: no wheezing, rales or rhonchi Abdomen nondistended,  Extremities: Left knee in brace1 Skin: no rashes Neuro: nonfocal  Lab Results:  Recent Labs  07/19/13 0419 07/20/13 0527  WBC 8.5 8.4  HGB 8.8* 8.0*  HCT 26.9* 24.5*  PLT 336 281    BMET  Recent Labs  07/18/13 1242  NA 139  K 4.2  CL 103  CO2 23  GLUCOSE 74  BUN 14  CREATININE 1.04  CALCIUM 9.4    Micro Results: Recent Results (from the past 240 hour(s))  TISSUE CULTURE     Status: None   Collection Time    07/18/13  3:39 PM      Result Value Range Status   Specimen Description TISSUE LEFT KNEE   Final   Special Requests SYNOVIUM LEFT KNEE PT ON CEFOMYCIN RIFAMPIN   Final   Gram Stain     Final   Value: NO WBC SEEN     NO ORGANISMS SEEN     Performed at [facility]   Culture     Final   Value: NO GROWTH 2 DAYS     Performed at [facility]   Report Status PENDING   Incomplete  AFB CULTURE WITH SMEAR     Status: None   Collection Time    07/18/13  3:39 PM      Result Value Range  Status   Specimen Description TISSUE LEFT KNEE   Final   Special Requests SYNOVIUM LEFT KNEE PT ON CEFOMYCIN RIFAMPIN   Final   ACID FAST SMEAR     Final   Value: NO ACID FAST BACILLI SEEN     Performed at [facility]   Culture     Final   Value: CULTURE WILL BE EXAMINED FOR 6 WEEKS BEFORE ISSUING A FINAL REPORT     Performed at [facility]   Report Status PENDING   Incomplete  ANAEROBIC CULTURE     Status: None   Collection Time    07/18/13  3:39 PM      Result Value Range Status   Specimen Description TISSUE LEFT KNEE   Final   Special Requests SYNOVIUM LEFT KNEE PT ON CEFOMYCIN RIFAMPIN   Final   Gram Stain     Final   Value: NO WBC SEEN     NO ORGANISMS SEEN     Performed at [facility]   Culture     Final   Value: NO ANAEROBES ISOLATED; CULTURE IN PROGRESS FOR 5 DAYS     Performed at [facility]   Report Status PENDING   Incomplete  FUNGUS CULTURE W SMEAR     Status: None   Collection Time    07/18/13  3:39 PM      Result Value Range Status   Specimen Description TISSUE LEFT KNEE   Final   Special Requests SYNOVIUM LEFT KNEE PT ON CEFOMYCIN RIFAMPIN   Final   Fungal Smear     Final   Value: NO YEAST OR FUNGAL ELEMENTS SEEN     Performed at [facility]   Culture     Final   Value: CULTURE IN PROGRESS FOR FOUR WEEKS     Performed at [facility]   Report Status PENDING   Incomplete    Studies/Results: No results found.    Assessment/Plan: Edwin Shaffer is a 67 y.o. male with PMHXof DM2 ('22), L TKR 04-23-13. He states he was home for 2 hours, then developed fever, wound drainage, wound swelling. He returned to the hospital, had his TKR resected and anbx impregnated spacer placed 05-23-13. His Cx were (-) at that time. He was d/c home on IV anbx (cefepime/vanco/rifampin) until 1 week ago. He was then changed to po doxy. He returns 11-18 with  worsening swelling of his knee. He had fallen ~ 1.5 weeks pta. He was found  on plain films to have a displaced spacer. On 11-19 he underwent removal of his spacer as well as necrotic bone/tissue.    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 removal of necrotic infected bone  --Continue meropenem and vancomycin  Follow cultures though they are with NGTD --If they failed to yield an organism would continue these antibiotics for additional 6-8 weeks postoperatively --he will need weekly cbc, bmp vancomycin troughs (dosed by home infusino co for trough 15-20 faxed to me at [phone]  #2 screening: Check HIV and hepatitis panel  I will ensure HSFU in the next 3 weeks in ID clinic  LOS: 2 days   Acey Lav 07/20/2013, 2:38 PM

## 2013-07-20 NOTE — Progress Notes (Addendum)
Clinical Social Work Department CLINICAL SOCIAL WORK PLACEMENT NOTE 07/20/2013  Patient:  Edwin Shaffer, Edwin Shaffer  Account Number:  [id number] Admit date:  07/18/2013  Clinical Social Worker:  Sharol Harness, Theresia Majors  Date/time:  07/20/2013 02:00 PM  Clinical Social Work is seeking post-discharge placement for this patient at the following level of care:   SKILLED NURSING   (*CSW will update this form in Epic as items are completed)   07/20/2013  Patient/family provided with [facility] Department of Clinical Social Work's list of facilities offering this level of care within the geographic area requested by the patient (or if unable, by the patient's family).  07/20/2013  Patient/family informed of their freedom to choose among providers that offer the needed level of care, that participate in Medicare, Medicaid or managed care program needed by the patient, have an available bed and are willing to accept the patient.  07/20/2013  Patient/family informed of [facility]' ownership interest in [facility], as well as of the fact that they are under no obligation to receive care at this facility.  PASARR submitted to [employer] on  PASARR number received from [employer] on  Existing  FL2 transmitted to all facilities in geographic area requested by pt/family on  07/20/2013 FL2 transmitted to all facilities within larger geographic area on   Patient informed that his/her managed care company has contracts with or will negotiate with  certain facilities, including the following:     Patient/family informed of bed offers received:  07/20/2013 Patient chooses bed at [facility] Physician recommends and patient [facility] bed [facility]    [facility]  Patient [facility] be transferred to facility by [facility]  The following physician request were entered in Epic:   Additional Comments:  Kerensa Nicklas, LCSWA [phone]

## 2013-07-20 NOTE — Evaluation (Signed)
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  Now s/p removal of spacer and I&D on 07/18/13.   Clinical Impression   Pt presents w/ dx as above impacting his ability to perform ADL and functional mobility tasks. He will benefit from acute OT to assist in maximizing independence w/ ADL's and functional transfers prior to d/c to next venue. Recommend SNF Rehab    OT Assessment  Patient needs continued OT Services    Follow Up Recommendations  SNF;Supervision/Assistance - 24 hour    Barriers to Discharge      Equipment Recommendations  Other (comment) (Defer to next venue)    Recommendations for Other Services    Frequency  Min 2X/week    Precautions / Restrictions Precautions Precautions: Fall Required Braces or Orthoses: Knee Immobilizer - Left Knee Immobilizer - Left: On at all times Restrictions Weight Bearing Restrictions: Yes LLE Weight Bearing: Partial weight bearing LLE Partial Weight Bearing Percentage or Pounds: 10 lbs Other Position/Activity Restrictions: No ROM/knee flexion of Lt knee - no joint   Pertinent Vitals/Pain Pt did not rate, no c/o    ADL  Eating/Feeding: Performed;Independent Where Assessed - Eating/Feeding: Chair Grooming: Performed;Wash/dry hands;Set up Where Assessed - Grooming: Supported sitting Upper Body Bathing: Simulated;Set up Where Assessed - Upper Body Bathing: Supported sitting Lower Body Bathing: Simulated;Maximal assistance Where Assessed - Lower Body Bathing: Supported sit to stand Upper Body Dressing: Simulated;Set up Where Assessed - Upper Body Dressing: Supported sitting Lower Body Dressing: Performed;Maximal assistance Where Assessed - Lower Body Dressing: Supported sit  to stand Toilet Transfer: Performed;Minimal Dentist Method: Sit to Barista: Raised toilet seat with arms (or 3-in-1 over toilet) Toileting - Clothing Manipulation and Hygiene: Performed;Minimal assistance Where Assessed - Engineer, mining and Hygiene: Standing;Sit to stand from 3-in-1 or toilet Tub/Shower Transfer Method: Not assessed Equipment Used: Gait belt;Rolling walker;Knee Immobilizer;Other (comment) (3:1 over toilet) Transfers/Ambulation Related to ADLs: Pt overall Min A for sit to stand from chair level using arm rests and vc's for safety, sequencing and hand placement. Pt used RW w/ Min A and vc's for safety ADL Comments: Pt was educated in role of OT, he participated in ADL (grooming, transfers and toileting tasks today). Pt requires vc's for safety, sequencing and hand placement. Pt I'ly states PWB status (he was educated in [place] at all times, no ROM to his knee and 10# wt bearing limit). He is noted to "hop" during functional mobility in room. He will need SNF rehab prior to d/c home to assist in maximizing independence with ADL's and self care.    OT Diagnosis: Generalized weakness;Acute pain  OT Problem List: Decreased knowledge of precautions;Decreased knowledge of use of DME or AE;Decreased safety awareness;Pain;Decreased activity tolerance OT Treatment Interventions: Self-care/ADL training;Therapeutic exercise;DME and/or AE instruction;Patient/family education;Therapeutic activities   OT Goals(Current goals can be found in the care plan section) Acute Rehab OT Goals Patient Stated Goal: To get well and get knee fixed. Time For Goal Achievement: 08/03/13 Potential to Achieve Goals: Good  Visit Information  Last OT Received On: 07/20/13 Assistance Needed: +1 History of Present Illness: Pt with multiple recent admissions due to infected Lt knee.  Now s/p removal of spacer and I&D on 07/18/13.       Prior  Functioning  Home Living Family/patient expects to be discharged to:: Skilled nursing facility Living Arrangements: Spouse/significant other Home Equipment: Walker - 2 wheels;Bedside commode Additional Comments: has walk in shower Prior Function Level of Independence: Independent with assistive device(s) Comments: prior to returning home after original Lt TKA  Communication Communication: No difficulties Dominant Hand: Left    Vision/Perception Vision - History Baseline Vision: Wears glasses only for reading   Cognition  Cognition Arousal/Alertness: Awake/alert Behavior During Therapy: WFL for tasks assessed/performed Overall Cognitive Status: History of cognitive impairments - at baseline Area of Impairment: Safety/judgement General Comments: Patient with decreased safety awareness.  Patient fell at home pta.    Extremity/Trunk Assessment Upper Extremity Assessment Upper Extremity Assessment: Overall WFL for tasks assessed Lower Extremity Assessment Lower Extremity Assessment: Defer to PT evaluation Cervical / Trunk Assessment Cervical / Trunk Assessment: Normal    Mobility Bed Mobility Bed Mobility: Not assessed (Up in chair) Transfers Transfers: Sit to Stand;Stand to Sit Sit to Stand: 4: Min assist;With armrests;From chair/3-in-1;With upper extremity assist Stand to Sit: 4: Min assist;With upper extremity assist;With armrests;To chair/3-in-1 Details for Transfer Assistance: Verbal cues for safety, sequencing, hand placement and technique.  Cues for maintaining PWB on LLE. Pt noted to "hop" at times and other times performed TDWB on LLE.           End of Session OT - End of Session Equipment Utilized During Treatment: Gait belt;Rolling walker;Left knee immobilizer Activity Tolerance: Patient tolerated treatment well Patient left: in chair;with call bell/phone within reach  GO     Alm Bustard 07/20/2013, 12:02 PM

## 2013-07-20 NOTE — Progress Notes (Signed)
Physical Therapy Treatment Patient Details Name: Edwin Shaffer MRN: [id number] DOB: [birth year] Today's Date: 07/20/2013 Time: 4098-1191 PT Time Calculation (min): 30 min  PT Assessment / Plan / Recommendation  History of Present Illness Pt with multiple recent admissions due to infected Lt knee.  Now s/p removal of spacer and I&D on 07/18/13.   PT Comments   Pt progressing with ambulation distance and requiring less assistance for transfers.  Pt has poor safety awareness and requires constant cues for safe RW sequencing.  Pt will benefit from further PT in a SNF to increase mobility, strength, and functional independence.   Follow Up Recommendations  SNF     Does the patient have the potential to tolerate intense rehabilitation     Barriers to Discharge        Equipment Recommendations       Recommendations for Other Services    Frequency Min 5X/week   Progress towards PT Goals Progress towards PT goals: Progressing toward goals  Plan Current plan remains appropriate    Precautions / Restrictions Precautions Precautions: Fall Required Braces or Orthoses: Knee Immobilizer - Left Knee Immobilizer - Left: On at all times Restrictions Weight Bearing Restrictions: Yes LLE Weight Bearing: Partial weight bearing LLE Partial Weight Bearing Percentage or Pounds: 10 lbs Other Position/Activity Restrictions: No ROM/knee flexion of Lt knee - no joint   Pertinent Vitals/Pain 6/10 L LE pain; nursing notified for pain meds    Mobility  Bed Mobility Bed Mobility: Not assessed (Up in chair) Details for Bed Mobility Assistance: Pt received in recliner and wanted to return to it Transfers Transfers: Sit to Stand;Stand to Sit Sit to Stand: With armrests;From chair/3-in-1;With upper extremity assist;4: Min guard Stand to Sit: With upper extremity assist;With armrests;To chair/3-in-1;4: Min guard Details for Transfer Assistance: vc's to maintain WB'ing status; cues for technique and  hand placement.   Ambulation/Gait Ambulation/Gait Assistance: 4: Min assist Ambulation Distance (Feet): 40 Feet Assistive device: Rolling walker Ambulation/Gait Assistance Details: cues throughout for correct sequence with RW; cues to maintain WB'ing status with LLE Gait Pattern: Step-to pattern;Decreased stance time - left;Decreased step length - right;Antalgic Gait velocity: Slow gait speed    Exercises     PT Diagnosis:    PT Problem List:   PT Treatment Interventions:     PT Goals (current goals can now be found in the care plan section) Acute Rehab PT Goals Patient Stated Goal: To get well and get knee fixed.  Visit Information  Last PT Received On: 07/20/13 Assistance Needed: +1 History of Present Illness: Pt with multiple recent admissions due to infected Lt knee.  Now s/p removal of spacer and I&D on 07/18/13.    Subjective Data  Patient Stated Goal: To get well and get knee fixed.   Cognition  Cognition Arousal/Alertness: Awake/alert Behavior During Therapy: WFL for tasks assessed/performed Overall Cognitive Status: History of cognitive impairments - at baseline Area of Impairment: Safety/judgement Safety/Judgement: Decreased awareness of safety General Comments: Patient with decreased safety awareness.  Patient fell at home pta.    Balance     End of Session PT - End of Session Equipment Utilized During Treatment: Gait belt;Left knee immobilizer Activity Tolerance: Patient tolerated treatment well;Patient limited by fatigue Patient left: in chair;with call bell/phone within reach Nurse Communication: Mobility status;Patient requests pain meds   GP     Ernestina Columbia, SPTA 07/20/2013, 2:21 PM

## 2013-07-20 NOTE — Progress Notes (Signed)
CSW (Clinical Child psychotherapist) informed by Malvin Johns that insurance auth received. Pt can dc when medically stable.  Shamar Engelmann, LCSWA [phone]

## 2013-07-20 NOTE — Progress Notes (Signed)
Clinical Social Work Department BRIEF PSYCHOSOCIAL ASSESSMENT 07/20/2013  Patient:  SOLACE, WENDORFF     Account Number:  [id number]     Admit date:  07/18/2013  Clinical Social Worker:  Harless Nakayama  Date/Time:  07/20/2013 02:00 PM  Referred by:  Physician  Date Referred:  07/20/2013 Referred for  SNF Placement   Other Referral:   Interview type:  Patient Other interview type:    PSYCHOSOCIAL DATA Living Status:  WIFE Admitted from facility:   Level of care:   Primary support name:  Ardian Haberland [phone] Primary support relationship to patient:  SPOUSE Degree of support available:   Pt has supportive wife.    CURRENT CONCERNS Current Concerns  Post-Acute Placement   Other Concerns:    SOCIAL WORK ASSESSMENT / PLAN CSW aware of PT recommendation for SNF. CSW spoke with pt about recommendation. Pt was already aware and informed CSW he would like to go to [facility] as he has been there in the past. CSW explained SNF referral process and asked if pt would be okay with CSW sending out to other facilities as well. Pt agreeable to being referred to SNFs in [place] in addition to [facility].   Assessment/plan status:  Psychosocial Support/Ongoing Assessment of Needs Other assessment/ plan:   Information/referral to community resources:   SNF list denied at this time    PATIENT'S/FAMILY'S RESPONSE TO PLAN OF CARE: Pt is agreeable to SNF and hoping to dc to [facility], [place] [phone]

## 2013-07-20 NOTE — Progress Notes (Signed)
PATIENT ID: Edwin Shaffer  MRN: [id number]  DOB/AGE:  Sep 20, 1945 / 67 y.o.  2 Days Post-Op Procedure(s) (LRB): IRRIGATION AND DEBRIDEMENT LEFT KNEE AND REMOVAL OF PMMA SPACER (Left)    PROGRESS NOTE Subjective: Patient is alert, oriented, no Nausea, no Vomiting, yes passing gas, no Bowel Movement. Taking PO well. Denies SOB, Chest or Calf Pain. Using Incentive Spirometer, PAS in place. Ambulate 10 lb limit on left leg,  Patient reports pain as mild  .    Objective: Vital signs in last 24 hours: Filed Vitals:   07/19/13 2218 07/20/13 0616 07/20/13 0800 07/20/13 0938  BP: 159/82 152/86  157/90  Pulse: 82 85    Temp: 97.7 F (36.5 C) 97.6 F (36.4 C)    TempSrc:      Resp: 18 18 18    Height:      Weight:      SpO2: 99% 99% 99%       Intake/Output from previous day: I/O last 3 completed shifts: In: 840 [P.O.:840] Out: 2125 [Urine:1700; Drains:425]   Intake/Output this shift:     LABORATORY DATA:  Recent Labs  07/18/13 1242  07/19/13 0419  07/19/13 1600 07/19/13 2223 07/20/13 0527 07/20/13 0646  WBC 7.9  --  8.5  --   --   --  8.4  --   HGB 11.0*  --  8.8*  --   --   --  8.0*  --   HCT 33.0*  --  26.9*  --   --   --  24.5*  --   PLT 351  --  336  --   --   --  281  --   NA 139  --   --   --   --   --   --   --   K 4.2  --   --   --   --   --   --   --   CL 103  --   --   --   --   --   --   --   CO2 23  --   --   --   --   --   --   --   BUN 14  --   --   --   --   --   --   --   CREATININE 1.04  --   --   --   --   --   --   --   GLUCOSE 74  --   --   --   --   --   --   --   GLUCAP  --   < >  --   < > 180* 124*  --  136*  CALCIUM 9.4  --   --   --   --   --   --   --   < > = values in this interval not displayed.  Examination: Neurologically intact Neurovascular intact Sensation intact distally Intact pulses distally Dorsiflexion/Plantar flexion intact Incision: dressing C/D/I and no drainage No cellulitis present Compartment soft}  Assessment:   2  Days Post-Op Procedure(s) (LRB): IRRIGATION AND DEBRIDEMENT LEFT KNEE AND REMOVAL OF PMMA SPACER (Left) ADDITIONAL DIAGNOSIS:  Diabetes and Hypertension Continue IV Vanc and zosyn per infectious disease.  Plan: PT/OT 10 lb limit with weight bearing.  Pt to wear knee immobilizer at all times. DVT Prophylaxis:  SCDx72hrs, ASA 325 mg BID x 2 weeks  DISCHARGE PLAN: Skilled Nursing Facility/Rehab when bed available. DISCHARGE NEEDS: HHPT, HHRN, Walker and 3-in-1 comode seat, IV abx.     Blaike Vickers R 07/20/2013, 10:14 AM

## 2013-07-21 LAB — HEPATITIS PANEL, ACUTE
HCV Ab: REACTIVE — AB
Hep A IgM: NONREACTIVE
Hep B C IgM: NONREACTIVE
Hepatitis B Surface Ag: NEGATIVE

## 2013-07-21 LAB — BASIC METABOLIC PANEL
Calcium: 8.8 mg/dL (ref 8.4–10.5)
GFR calc Af Amer: 82 mL/min — ABNORMAL LOW (ref >90)
GFR calc non Af Amer: 71 mL/min — ABNORMAL LOW (ref >90)
Glucose, Bld: 169 mg/dL — ABNORMAL HIGH (ref 70–99)
Potassium: 4.4 mEq/L (ref 3.5–5.1)
Sodium: 134 mEq/L — ABNORMAL LOW (ref 135–145)

## 2013-07-21 LAB — GLUCOSE, CAPILLARY: Glucose-Capillary: 157 mg/dL — ABNORMAL HIGH (ref 70–99)

## 2013-07-21 LAB — CBC
HCT: 23.2 % — ABNORMAL LOW (ref 39.0–52.0)
Hemoglobin: 7.8 g/dL — ABNORMAL LOW (ref 13.0–17.0)
MCH: 28.8 pg (ref 26.0–34.0)
MCV: 85.6 fL (ref 78.0–100.0)
Platelets: 277 10*3/uL (ref 150–400)
RBC: 2.71 MIL/uL — ABNORMAL LOW (ref 4.22–5.81)
WBC: 8.6 10*3/uL (ref 4.0–10.5)

## 2013-07-21 NOTE — Progress Notes (Signed)
PATIENT ID: Lady Deutscher Cosma   3 Days Post-Op Procedure(s) (LRB): IRRIGATION AND DEBRIDEMENT LEFT KNEE AND REMOVAL OF PMMA SPACER (Left)  Subjective: Reports feeling good today. Denies lightheadedness, fatigue. LEft knee pain with mvmt, controlled with medication.   Objective:  Filed Vitals:   07/21/13 0442  BP: 148/76  Pulse: 92  Temp: 98.1 F (36.7 C)  Resp: 16     L LE in knee immobilizer Drains in place Wiggles toes, distally nvi Calf soft/nontender  Labs:   Recent Labs  07/18/13 1242 07/19/13 0419 07/20/13 0527 07/21/13 0615  HGB 11.0* 8.8* 8.0* 7.8*   Recent Labs  07/20/13 0527 07/21/13 0615  WBC 8.4 8.6  RBC 2.87* 2.71*  HCT 24.5* 23.2*  PLT 281 277   Recent Labs  07/18/13 1242 07/21/13 0615  NA 139 134*  K 4.2 4.4  CL 103 103  CO2 23 22  BUN 14 13  CREATININE 1.04 1.07  GLUCOSE 74 169*  CALCIUM 9.4 8.8    Assessment and Plan: 3 days s/p I &D left knee and removal of pimma spacer Continue abx per ID PT/OT 10 lb weight bearing limit, continue knee immobilizer at all times Leave drains in over weekend Likely SNF on Monday ABLA- asymptomatic, will continue to monitor  VTE proph: ASA 325mg  BID, SCDs

## 2013-07-21 NOTE — Progress Notes (Signed)
ANTIBIOTIC CONSULT NOTE - FOLLOW UP  Pharmacy Consult for Vancomycin and Meropenem Indication:  Infected left knee  No Known Allergies  Patient Measurements: Height: 6' 2.5" (189.2 cm) Weight: 276 lb (125.193 kg) IBW/kg (Calculated) : 83.35  Vital Signs: Temp: 98.1 F (36.7 C) (11/22 0442) Temp src: Oral (11/22 0442) BP: 148/76 mmHg (11/22 0442) Pulse Rate: 92 (11/22 0442) Intake/Output from previous day: 11/21 0701 - 11/22 0700 In: 1115 [P.O.:240; I.V.:875] Out: 600 [Urine:500; Drains:100]  Labs:  Recent Labs  07/18/13 1242 07/19/13 0419 07/20/13 0527 07/21/13 0615  WBC 7.9 8.5 8.4 8.6  HGB 11.0* 8.8* 8.0* 7.8*  PLT 351 336 281 277  CREATININE 1.04  --   --  1.07   Estimated Creatinine Clearance: 97.4 ml/min (by C-G formula based on Cr of 1.07).  Microbiology: Recent Results (from the past 720 hour(s))  TISSUE CULTURE     Status: None   Collection Time    07/18/13  3:39 PM      Result Value Range Status   Specimen Description TISSUE LEFT KNEE   Final   Special Requests SYNOVIUM LEFT KNEE PT ON CEFOMYCIN RIFAMPIN   Final   Gram Stain     Final   Value: NO WBC SEEN     NO ORGANISMS SEEN     Performed at [facility]   Culture     Final   Value: NO GROWTH 2 DAYS     Performed at [facility]   Report Status PENDING   Incomplete  AFB CULTURE WITH SMEAR     Status: None   Collection Time    07/18/13  3:39 PM      Result Value Range Status   Specimen Description TISSUE LEFT KNEE   Final   Special Requests SYNOVIUM LEFT KNEE PT ON CEFOMYCIN RIFAMPIN   Final   ACID FAST SMEAR     Final   Value: NO ACID FAST BACILLI SEEN     Performed at [facility]   Culture     Final   Value: CULTURE WILL BE EXAMINED FOR 6 WEEKS BEFORE ISSUING A FINAL REPORT     Performed at [facility]   Report Status PENDING   Incomplete  ANAEROBIC CULTURE     Status: None   Collection Time    07/18/13  3:39 PM      Result Value Range Status   Specimen Description TISSUE LEFT KNEE   Final   Special Requests SYNOVIUM LEFT KNEE PT ON CEFOMYCIN RIFAMPIN   Final   Gram Stain     Final   Value: NO WBC SEEN     NO ORGANISMS SEEN     Performed at [facility]   Culture     Final   Value: NO ANAEROBES ISOLATED; CULTURE IN PROGRESS FOR 5 DAYS     Performed at [facility]   Report Status PENDING   Incomplete  FUNGUS CULTURE W SMEAR     Status: None   Collection Time    07/18/13  3:39 PM      Result Value Range Status   Specimen Description TISSUE LEFT KNEE   Final   Special Requests SYNOVIUM LEFT KNEE PT ON CEFOMYCIN RIFAMPIN   Final   Fungal Smear     Final   Value: NO YEAST OR FUNGAL ELEMENTS SEEN     Performed at [facility]   Culture     Final   Value: CULTURE  IN PROGRESS FOR FOUR WEEKS     Performed at [facility]   Report Status PENDING   Incomplete   Assessment: 67 yo M s/p removal of left knee spacer and I&D and debridement with placement of drains (done 11/19). Pharmacy is consulted to dose meropenem and vancomycin. Timeline: 04/23/13 - original knee arthroplasty  9/24 - TKR removed and antibiotic spacer placed 11/4 - IV antibiotics stopped - changed to oral doxycycline  Cultures obtained intraoperatively have remained no growth to date. Renal function remains stable with a  CrCl ~100 ml/min. Per ID, if cultures remain negative, plan is to continue antibiotics for an additional 6-8 weeks postoperatively.   11/22 vancomycin trough therapeutic at 19.4 (drawn appropriately)     Goal of Therapy:  Vancomycin trough level 15-20 mcg/ml appropriate Meropenem dose for renal function and infection  Plan:    Continue Vancomycin 1250 mg IV q12hrs.   Continue Meropenem 1 gram IV q8hrs.   Will follow renal function and culture data.    Donell Tomkins C. Fenton Candee, PharmD Clinical Pharmacist-Resident Pager: [phone] Pharmacy: [phone] 07/21/2013 7:49 AM

## 2013-07-21 NOTE — Progress Notes (Signed)
Physical Therapy Treatment Patient Details Name: Edwin Shaffer MRN: [id number] DOB: Jan 25, 1946 Today's Date: 07/21/2013 Time: 4098-1191 PT Time Calculation (min): 19 min  PT Assessment / Plan / Recommendation  History of Present Illness Pt with multiple recent admissions due to infected Lt knee.  Now s/p removal of spacer and I&D on 07/18/13.   PT Comments   Pt making steady progress with mobility.  Follow Up Recommendations  SNF     Equipment Recommendations  None recommended by PT    Frequency Min 5X/week   Progress towards PT Goals Progress towards PT goals: Progressing toward goals  Plan Current plan remains appropriate    Precautions / Restrictions Precautions Precautions: Fall Required Braces or Orthoses: Knee Immobilizer - Left Knee Immobilizer - Left: On at all times Restrictions LLE Weight Bearing: Partial weight bearing LLE Partial Weight Bearing Percentage or Pounds: 10 lbs Other Position/Activity Restrictions: No ROM/knee flexion of Lt knee        Mobility  Bed Mobility Bed Mobility: Not assessed Transfers Sit to Stand: 4: Min guard;From chair/3-in-1;With upper extremity assist;With armrests Stand to Sit: 4: Min guard;To chair/3-in-1;With upper extremity assist;With armrests Details for Transfer Assistance: cues for hand placement, ant weight shift to assist with standing and for use of arm to control descent with sitting down Ambulation/Gait Ambulation/Gait Assistance: 4: Min assist Ambulation Distance (Feet): 50 Feet Assistive device: Rolling walker Ambulation/Gait Assistance Details: cues for sequence (pt forgets to advance left leg and often it's "left behind"/foot gets caught under him), and for walker position with gait. pt maintained nwb to tdwb with gait and demo'd heavy UE reliance on RW to maintain weight bearing status Gait Pattern: Step-to pattern;Antalgic Gait velocity: Slow gait speed    Exercises Total Joint Exercises Ankle  Circles/Pumps: AROM;Both;10 reps;Seated Hip ABduction/ADduction: AAROM;Strengthening;Left;10 reps;Seated Straight Leg Raises: AAROM;Strengthening;Left;10 reps;Seated     PT Goals (current goals can now be found in the care plan section) Acute Rehab PT Goals Patient Stated Goal: To get well and get knee fixed. Time For Goal Achievement: 08/02/13 Potential to Achieve Goals: Good  Visit Information  Last PT Received On: 07/21/13 Assistance Needed: +1 History of Present Illness: Pt with multiple recent admissions due to infected Lt knee.  Now s/p removal of spacer and I&D on 07/18/13.    Subjective Data  Patient Stated Goal: To get well and get knee fixed.   Cognition  Cognition Arousal/Alertness: Awake/alert Behavior During Therapy: WFL for tasks assessed/performed Overall Cognitive Status: History of cognitive impairments - at baseline Area of Impairment: Safety/judgement;Problem solving Safety/Judgement: Decreased awareness of safety Problem Solving: Slow processing;Requires verbal cues;Requires tactile cues       End of Session PT - End of Session Equipment Utilized During Treatment: Gait belt;Left knee immobilizer Activity Tolerance: Patient tolerated treatment well;Patient limited by fatigue Patient left: in chair;with call bell/phone within reach Nurse Communication: Mobility status;Patient requests pain meds   GP     Sallyanne Kuster 07/21/2013, 4:30 PM  Sallyanne Kuster, PTA Office- [phone]

## 2013-07-22 LAB — TISSUE CULTURE
Culture: NO GROWTH
Gram Stain: NONE SEEN

## 2013-07-22 LAB — GLUCOSE, CAPILLARY
Glucose-Capillary: 159 mg/dL — ABNORMAL HIGH (ref 70–99)
Glucose-Capillary: 156 mg/dL — ABNORMAL HIGH (ref 70–99)

## 2013-07-22 MED ORDER — ASPIRIN EC 325 MG PO TBEC
325.0000 mg | DELAYED_RELEASE_TABLET | Freq: Two times a day (BID) | ORAL | Status: DC
  Administered 2013-07-22 – 2013-07-23 (×2): 325 mg via ORAL
  Filled 2013-07-22 (×3): qty 1

## 2013-07-22 MED ORDER — DAPAGLIFLOZIN PROPANEDIOL 5 MG PO TABS
5.0000 mg | ORAL_TABLET | Freq: Every day | ORAL | Status: DC
  Administered 2013-07-23: 5 mg via ORAL
  Filled 2013-07-22: qty 1

## 2013-07-22 NOTE — Progress Notes (Signed)
PATIENT ID: Edwin Shaffer   4 Days Post-Op Procedure(s) (LRB): IRRIGATION AND DEBRIDEMENT LEFT KNEE AND REMOVAL OF PMMA SPACER (Left)  Subjective: In good spirits today. Again, denies lightheadedness/dizziness. Working with PT well and proud of progress. Comfortable and denies pain at rest.   Objective:  Filed Vitals:   07/22/13 0508  BP: 144/73  Pulse: 94  Temp: 99.3 F (37.4 C)  Resp: 16     Awake, alert, oriented Left dressing c/d/i Incision inspected and looks benign Knee immobilizer reapplied No calf tenderness, wiggles toes, distally NVI  Labs:   Recent Labs  07/20/13 0527 07/21/13 0615  HGB 8.0* 7.8*   Recent Labs  07/20/13 0527 07/21/13 0615  WBC 8.4 8.6  RBC 2.87* 2.71*  HCT 24.5* 23.2*  PLT 281 277   Recent Labs  07/21/13 0615  NA 134*  K 4.4  CL 103  CO2 22  BUN 13  CREATININE 1.07  GLUCOSE 169*  CALCIUM 8.8    Assessment and Plan: Doing well, continue with PT PT/OT 10 lb weight bearing limit, continue knee immobilizer at all times Continue IV abx per ID, drains in until tomorrow Plan on d/c to SNF tomorrow per Dr. Turner Daniels  VTE proph: ASA 325mg  BID, SCDs

## 2013-07-23 DIAGNOSIS — T889XXS Complication of surgical and medical care, unspecified, sequela: Secondary | ICD-10-CM

## 2013-07-23 DIAGNOSIS — B1920 Unspecified viral hepatitis C without hepatic coma: Secondary | ICD-10-CM

## 2013-07-23 DIAGNOSIS — Z113 Encounter for screening for infections with a predominantly sexual mode of transmission: Secondary | ICD-10-CM

## 2013-07-23 DIAGNOSIS — IMO0002 Reserved for concepts with insufficient information to code with codable children: Secondary | ICD-10-CM

## 2013-07-23 DIAGNOSIS — S82109A Unspecified fracture of upper end of unspecified tibia, initial encounter for closed fracture: Secondary | ICD-10-CM

## 2013-07-23 LAB — GLUCOSE, CAPILLARY
Glucose-Capillary: 125 mg/dL — ABNORMAL HIGH (ref 70–99)
Glucose-Capillary: 136 mg/dL — ABNORMAL HIGH (ref 70–99)

## 2013-07-23 LAB — ANAEROBIC CULTURE

## 2013-07-23 MED ORDER — HEPARIN SOD (PORK) LOCK FLUSH 100 UNIT/ML IV SOLN
250.0000 [IU] | INTRAVENOUS | Status: AC | PRN
  Administered 2013-07-23: 250 [IU]

## 2013-07-23 MED ORDER — VANCOMYCIN HCL 10 G IV SOLR: 1250.0000 mg | Freq: Two times a day (BID) | INTRAVENOUS | Status: DC

## 2013-07-23 MED ORDER — OXYCODONE-ACETAMINOPHEN 5-325 MG PO TABS: 1.0000 | ORAL_TABLET | ORAL | Status: DC | PRN

## 2013-07-23 MED ORDER — SODIUM CHLORIDE 0.9 % IV SOLN: 1.0000 g | Freq: Three times a day (TID) | INTRAVENOUS | Status: DC

## 2013-07-23 NOTE — Discharge Summary (Signed)
Patient ID: ZYRION COEY MRN: [id number] DOB/AGE: May 27, 1946 67 y.o.  Admit date: 07/18/2013 Discharge date: 07/23/2013  Admission Diagnoses:  Principal Problem:   Fracture of Left Medial tibial plateau, closed Osteomyelitis   Discharge Diagnoses:  Same  Past Medical History  Diagnosis Date  . Hypertension   . Diabetes mellitus without complication   . Cancer     vocal cord  . Arthritis   . Constipation   . Heart murmur   . Shortness of breath     with exertion    Surgeries: Procedure(s): IRRIGATION AND DEBRIDEMENT LEFT KNEE AND REMOVAL OF PMMA SPACER on 07/18/2013   Consultants:    Discharged Condition: Improved  Hospital Course: KENIEL RALSTON is an 67 y.o. male who was admitted 07/18/2013 for operative treatment ofFracture of tibial plateau, closed. Patient has severe unremitting pain that affects sleep, daily activities, and work/hobbies. After pre-op clearance the patient was taken to the operating room on 07/18/2013 and underwent  Procedure(s): IRRIGATION AND DEBRIDEMENT LEFT KNEE AND REMOVAL OF PMMA SPACER.    Patient was given perioperative antibiotics: Anti-infectives   Start     Dose/Rate Route Frequency Ordered Stop   07/23/13 0000  sodium chloride 0.9 % SOLN 100 mL with meropenem 1 G SOLR 1 g     1 g 200 mL/hr over 30 Minutes Intravenous Every 8 hours 07/23/13 0946     07/23/13 0000  sodium chloride 0.9 % SOLN 250 mL with vancomycin 10 G SOLR 1,250 mg     1,250 mg 166.7 mL/hr over 90 Minutes Intravenous Every 12 hours 07/23/13 0946     07/19/13 1600  meropenem (MERREM) 1 g in sodium chloride 0.9 % 100 mL IVPB     1 g 200 mL/hr over 30 Minutes Intravenous Every 8 hours 07/19/13 1452     07/19/13 0600  vancomycin (VANCOCIN) 1,250 mg in sodium chloride 0.9 % 250 mL IVPB     1,250 mg 166.7 mL/hr over 90 Minutes Intravenous Every 12 hours 07/18/13 1954     07/18/13 2200  doxycycline (VIBRA-TABS) tablet 100 mg  Status:  Discontinued     100 mg Oral 2  times daily 07/18/13 1940 07/19/13 1431   07/18/13 2045  vancomycin (VANCOCIN) 2,500 mg in sodium chloride 0.9 % 500 mL IVPB     2,500 mg 250 mL/hr over 120 Minutes Intravenous  Once 07/18/13 1954 07/19/13 0059   07/18/13 1549  vancomycin (VANCOCIN) powder  Status:  Discontinued       As needed 07/18/13 1549 07/18/13 1729   07/18/13 1548  tobramycin (NEBCIN) powder  Status:  Discontinued       As needed 07/18/13 1549 07/18/13 1729   07/18/13 0600  ceFAZolin (ANCEF) 3 g in dextrose 5 % 50 mL IVPB     3 g 160 mL/hr over 30 Minutes Intravenous On call to O.R. 07/17/13 2106 07/18/13 1519       Patient was given sequential compression devices, early ambulation, and chemoprophylaxis to prevent DVT.  Patient benefited maximally from hospital stay and there were no complications.    Recent vital signs: Patient Vitals for the past 24 hrs:  BP Temp Temp src Pulse Resp SpO2  07/23/13 0800 - - - - 16 97 %  07/23/13 0602 152/79 mmHg 98.1 F (36.7 C) Oral 103 19 100 %  07/22/13 2125 131/70 mmHg 98.1 F (36.7 C) Oral 92 18 100 %  07/22/13 1507 - 98 F (36.7 C) Oral 88 20 100 %  Recent laboratory studies:  Recent Labs  07/21/13 0615  WBC 8.6  HGB 7.8*  HCT 23.2*  PLT 277  NA 134*  K 4.4  CL 103  CO2 22  BUN 13  CREATININE 1.07  GLUCOSE 169*  CALCIUM 8.8     Discharge Medications:     Medication List         aspirin EC 325 MG tablet  Take 1 tablet (325 mg total) by mouth 2 (two) times daily.     diltiazem 300 MG 24 hr capsule  Commonly known as:  TIAZAC  Take 300 mg by mouth daily.     doxycycline 100 MG tablet  Commonly known as:  VIBRA-TABS  Take 1 tablet (100 mg total) by mouth 2 (two) times daily.     FARXIGA 5 MG Tabs  Generic drug:  Dapagliflozin Propanediol  Take 5 mg by mouth daily.     insulin glargine 100 UNIT/ML injection  Commonly known as:  LANTUS  Inject 20 Units into the skin at bedtime.     insulin lispro protamine-lispro (75-25) 100 UNIT/ML  Susp injection  Commonly known as:  HUMALOG 75/25  Inject 45 Units into the skin daily with breakfast.     lisinopril 20 MG tablet  Commonly known as:  PRINIVIL,ZESTRIL  Take 20 mg by mouth daily.     methocarbamol 500 MG tablet  Commonly known as:  ROBAXIN  Take 1 tablet (500 mg total) by mouth 2 (two) times daily with a meal.     oxyCODONE-acetaminophen 5-325 MG per tablet  Commonly known as:  ROXICET  Take 1-2 tablets by mouth every 4 (four) hours as needed.     rifampin 300 MG capsule  Commonly known as:  RIFADIN  Take 300 mg by mouth every morning.     sodium chloride 0.9 % SOLN 100 mL with meropenem 1 G SOLR 1 g  Inject 1 g into the vein every 8 (eight) hours.     sodium chloride 0.9 % SOLN 250 mL with vancomycin 10 G SOLR 1,250 mg  Inject 1,250 mg into the vein every 12 (twelve) hours.     tamsulosin 0.4 MG Caps capsule  Commonly known as:  FLOMAX  Take 0.4 mg by mouth daily.        Diagnostic Studies: No results found.  Disposition: 06-Home-Health Care Svc      Discharge Orders   Future Appointments Provider Department Dept Phone   08/02/2013 9:45 AM Randall Hiss, MD [facility] [phone]   09/05/2013 10:30 AM Ginnie Smart, MD [facility] [phone]   Future Orders Complete By Expires   Call MD / Call 911  As directed    Comments:     If you experience chest pain or shortness of breath, CALL 911 and be transported to the hospital emergency room.  If you develope a fever above 101 F, pus (white drainage) or increased drainage or redness at the wound, or calf pain, call your surgeon's office.   Change dressing  As directed    Comments:     Change dressing on 5, then change the dressing daily with sterile 4 x 4 inch gauze dressing and apply TED hose.  You may clean the incision with alcohol prior to redressing.   Constipation Prevention  As directed    Comments:     Drink  plenty of fluids.  Prune juice may be helpful.  You may use a stool softener, such as Colace (over the counter) 100 mg twice a day.  Use MiraLax (over the counter) for constipation as needed.   Diet - low sodium heart healthy  As directed    Discharge instructions  As directed    Comments:     Follow up in office with Dr. Turner Daniels in 10-14 days.  Pt is to wear knee immobilizer at all times.   Driving restrictions  As directed    Comments:     No driving for 2 weeks   Increase activity slowly as tolerated  As directed    Comments:     Pt is to wear knee immobilizer at all times.  10 Lb weight bearing on left leg.      Follow-up Information   Follow up with [name], MD In 10 days.   Specialty:  Orthopedic Surgery   Contact information:   [address] [place] [address] [phone]        Signed: Henry Russel 07/23/2013, 9:53 AM

## 2013-07-23 NOTE — Progress Notes (Signed)
Rylee Nuzum, PTA [phone] 07/23/2013  

## 2013-07-23 NOTE — Progress Notes (Signed)
PICC line capped off with Heparin 250 units, with GBR. Edwin Shaffer

## 2013-07-23 NOTE — Progress Notes (Addendum)
[facility]   Day #5 meropenem  Day #5 vancomycin  Subjective: No new complaints   Antibiotics:  Anti-infectives   Start     Dose/Rate Route Frequency Ordered Stop   07/23/13 0000  sodium chloride 0.9 % SOLN 100 mL with meropenem 1 G SOLR 1 g     1 g 200 mL/hr over 30 Minutes Intravenous Every 8 hours 07/23/13 0946     07/23/13 0000  sodium chloride 0.9 % SOLN 250 mL with vancomycin 10 G SOLR 1,250 mg     1,250 mg 166.7 mL/hr over 90 Minutes Intravenous Every 12 hours 07/23/13 0946     07/19/13 1600  meropenem (MERREM) 1 g in sodium chloride 0.9 % 100 mL IVPB     1 g 200 mL/hr over 30 Minutes Intravenous Every 8 hours 07/19/13 1452     07/19/13 0600  vancomycin (VANCOCIN) 1,250 mg in sodium chloride 0.9 % 250 mL IVPB     1,250 mg 166.7 mL/hr over 90 Minutes Intravenous Every 12 hours 07/18/13 1954     07/18/13 2200  doxycycline (VIBRA-TABS) tablet 100 mg  Status:  Discontinued     100 mg Oral 2 times daily 07/18/13 1940 07/19/13 1431   07/18/13 2045  vancomycin (VANCOCIN) 2,500 mg in sodium chloride 0.9 % 500 mL IVPB     2,500 mg 250 mL/hr over 120 Minutes Intravenous  Once 07/18/13 1954 07/19/13 0059   07/18/13 1549  vancomycin (VANCOCIN) powder  Status:  Discontinued       As needed 07/18/13 1549 07/18/13 1729   07/18/13 1548  tobramycin (NEBCIN) powder  Status:  Discontinued       As needed 07/18/13 1549 07/18/13 1729   07/18/13 0600  ceFAZolin (ANCEF) 3 g in dextrose 5 % 50 mL IVPB     3 g 160 mL/hr over 30 Minutes Intravenous On call to O.R. 07/17/13 2106 07/18/13 1519      Medications: Scheduled Meds: . aspirin EC  325 mg Oral BID  . Dapagliflozin Propanediol  5 mg Oral Daily  . diltiazem  300 mg Oral Daily  . docusate sodium  100 mg Oral BID  . insulin aspart  0-20 Units Subcutaneous TID WC  . lisinopril  20 mg Oral Daily  . meropenem (MERREM) IV  1 g Intravenous Q8H  . tamsulosin  0.4 mg Oral Daily  . vancomycin  1,250 mg  Intravenous Q12H   Continuous Infusions: . dextrose 5 % and 0.45 % NaCl with KCl 20 mEq/L 125 mL/hr at 07/22/13 2358   PRN Meds:.acetaminophen, acetaminophen, alum & mag hydroxide-simeth, bisacodyl, diphenhydrAMINE, HYDROmorphone (DILAUDID) injection, menthol-cetylpyridinium, methocarbamol (ROBAXIN) IV, methocarbamol, metoCLOPramide (REGLAN) injection, metoCLOPramide, ondansetron (ZOFRAN) IV, ondansetron, oxyCODONE, phenol, senna-docusate, sodium chloride   Objective: Weight change:   Intake/Output Summary (Last 24 hours) at 07/23/13 1139 Last data filed at 07/23/13 0500  Gross per 24 hour  Intake   2710 ml  Output   3475 ml  Net   -765 ml   Blood pressure 152/79, pulse 103, temperature 98.1 F (36.7 C), temperature source Oral, resp. rate 16, height 6' 2.5" (1.892 m), weight 276 lb (125.193 kg), SpO2 97.00%. Temp:  [98 F (36.7 C)-98.1 F (36.7 C)] 98.1 F (36.7 C) (11/24 0602) Pulse Rate:  [88-103] 103 (11/24 0602) Resp:  [16-20] 16 (11/24 0800) BP: (131-152)/(70-79) 152/79 mmHg (11/24 0602) SpO2:  [97 %-100 %] 97 % (11/24 0800)  Physical Exam: General: Alert and awake, oriented x3,  not in any acute distress dysphoric HEENT: anicteric sclera, pupils reactive to light and accommodation, EOMI CVS regular rate, normal r,   Chest: no wheezing, rales or rhonchi Abdomen nondistended,  Extremities: Left knee in brace1 Skin: no rashes Neuro: nonfocal  Lab Results:  Recent Labs  07/21/13 0615  WBC 8.6  HGB 7.8*  HCT 23.2*  PLT 277    BMET  Recent Labs  07/21/13 0615  NA 134*  K 4.4  CL 103  CO2 22  GLUCOSE 169*  BUN 13  CREATININE 1.07  CALCIUM 8.8    Micro Results: Recent Results (from the past 240 hour(s))  TISSUE CULTURE     Status: None   Collection Time    07/18/13  3:39 PM      Result Value Range Status   Specimen Description TISSUE LEFT KNEE   Final   Special Requests SYNOVIUM LEFT KNEE PT ON CEFOMYCIN RIFAMPIN   Final   Gram Stain     Final    Value: NO WBC SEEN     NO ORGANISMS SEEN     Performed at [facility]   Culture     Final   Value: NO GROWTH 3 DAYS     Performed at [facility]   Report Status 07/22/2013 FINAL   Final  AFB CULTURE WITH SMEAR     Status: None   Collection Time    07/18/13  3:39 PM      Result Value Range Status   Specimen Description TISSUE LEFT KNEE   Final   Special Requests SYNOVIUM LEFT KNEE PT ON CEFOMYCIN RIFAMPIN   Final   ACID FAST SMEAR     Final   Value: NO ACID FAST BACILLI SEEN     Performed at [facility]   Culture     Final   Value: CULTURE WILL BE EXAMINED FOR 6 WEEKS BEFORE ISSUING A FINAL REPORT     Performed at [facility]   Report Status PENDING   Incomplete  ANAEROBIC CULTURE     Status: None   Collection Time    07/18/13  3:39 PM      Result Value Range Status   Specimen Description TISSUE LEFT KNEE   Final   Special Requests SYNOVIUM LEFT KNEE PT ON CEFOMYCIN RIFAMPIN   Final   Gram Stain     Final   Value: NO WBC SEEN     NO ORGANISMS SEEN     Performed at [facility]   Culture     Final   Value: NO ANAEROBES ISOLATED; CULTURE IN PROGRESS FOR 5 DAYS     Performed at [facility]   Report Status PENDING   Incomplete  FUNGUS CULTURE W SMEAR     Status: None   Collection Time    07/18/13  3:39 PM      Result Value Range Status   Specimen Description TISSUE LEFT KNEE   Final   Special Requests SYNOVIUM LEFT KNEE PT ON CEFOMYCIN RIFAMPIN   Final   Fungal Smear     Final   Value: NO YEAST OR FUNGAL ELEMENTS SEEN     Performed at [facility]   Culture     Final   Value: CULTURE IN PROGRESS FOR FOUR WEEKS     Performed at [facility]   Report Status PENDING   Incomplete    Studies/Results: No results found.    Assessment/Plan: Edwin Shaffer  is a 67 y.o. male with PMHXof DM2 ('86), L TKR 04-23-13. He states he was home for 2 hours, then developed fever, wound drainage, wound swelling. He  returned to the hospital, had his TKR resected and anbx impregnated spacer placed 05-23-13. His Cx were (-) at that time. He was d/c home on IV anbx (cefepime/vanco/rifampin) until 1 week ago. He was then changed to po doxy. He returns 11-18 with worsening swelling of his knee. He had fallen ~ 1.5 weeks pta. He was found on plain films to have a displaced spacer. On 11-19 he underwent removal of his spacer as well as necrotic bone/tissue.    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 removal of necrotic infected bone. Bacterial cultures are NGrowth, final. AE. AFB and fungal pending    --Continue meropenem and vancomycin forl 6-8 weeks postoperatively --he will need weekly cbc, bmp vancomycin troughs (dosed by home infusino co for trough 15-20 faxed to me at [phone]  #2 screening:  HIV negative, Hep C positive    #3 Hep C: needs Hep C viral load and genotype tested if not done as inpatient we can do it in our clinic for followup   HE HAS [facility] appt with me on 08/02/13 @ 945 AM    LOS: 5 days   Acey Lav 07/23/2013, 11:39 AM

## 2013-07-23 NOTE — Progress Notes (Signed)
PATIENT ID: Edwin Shaffer  MRN: [id number]  DOB/AGE:  12-24-1945 / 67 y.o.  5 Days Post-Op Procedure(s) (LRB): IRRIGATION AND DEBRIDEMENT LEFT KNEE AND REMOVAL OF PMMA SPACER (Left)    PROGRESS NOTE Subjective: Patient is alert, oriented, no Nausea, no Vomiting, yes passing gas, no Bowel Movement. Taking PO well. Denies SOB, Chest or Calf Pain. Using Incentive Spirometer, PAS in place. Ambulate 10 lb max weight on left leg, Patient reports pain as mild  .    Objective: Vital signs in last 24 hours: Filed Vitals:   07/22/13 0508 07/22/13 1507 07/22/13 2125 07/23/13 0602  BP: 144/73  131/70 152/79  Pulse: 94 88 92 103  Temp: 99.3 F (37.4 C) 98 F (36.7 C) 98.1 F (36.7 C) 98.1 F (36.7 C)  TempSrc:  Oral Oral Oral  Resp: 16 20 18 19   Height:      Weight:      SpO2: 92% 100% 100% 100%      Intake/Output from previous day: I/O last 3 completed shifts: In: 6480 [P.O.:680; I.V.:5000; IV Piggyback:800] Out: 4300 [Urine:4175; Drains:125]   Intake/Output this shift:     LABORATORY DATA:  Recent Labs  07/21/13 0615  07/22/13 1630 07/22/13 2142 07/23/13 0641  WBC 8.6  --   --   --   --   HGB 7.8*  --   --   --   --   HCT 23.2*  --   --   --   --   PLT 277  --   --   --   --   NA 134*  --   --   --   --   K 4.4  --   --   --   --   CL 103  --   --   --   --   CO2 22  --   --   --   --   BUN 13  --   --   --   --   CREATININE 1.07  --   --   --   --   GLUCOSE 169*  --   --   --   --   GLUCAP  --   < > 156* 133* 125*  CALCIUM 8.8  --   --   --   --   < > = values in this interval not displayed.  Examination: Neurologically intact ABD soft Neurovascular intact Sensation intact distally Intact pulses distally Dorsiflexion/Plantar flexion intact Incision: dressing C/D/I and no drainage No cellulitis present Compartment soft} Blood and plasma separated in drain indicating minimal recent drainage, drain pulled without difficulty.  Assessment:   5 Days Post-Op  Procedure(s) (LRB): IRRIGATION AND DEBRIDEMENT LEFT KNEE AND REMOVAL OF PMMA SPACER (Left) ADDITIONAL DIAGNOSIS:  Diabetes and Hypertension  Plan: PT/OT 10 lb max weight on left leg. DVT Prophylaxis:  SCDx72hrs, ASA 325 mg BID x 2 weeks DISCHARGE PLAN: Skilled Nursing Facility/Rehab  Today if cleared by pt/ot. DISCHARGE NEEDS: HHPT, HHRN, Walker and 3-in-1 comode seat Continue out patient IV abx per infectious disease.     Ziere Docken R 07/23/2013, 7:35 AM

## 2013-07-23 NOTE — Progress Notes (Signed)
CSW (Clinical Child psychotherapist) prepared pt dc packet and placed with shadow chart. Pt nurse to add prescriptions. CSW informed by pt nurse that pt is ready to dc. CSW has called for non-emergent ambulance transport. CSW signing off.  Royce Sciara, LCSWA [phone]

## 2013-07-30 ENCOUNTER — Encounter: Payer: Self-pay | Admitting: Internal Medicine

## 2013-07-30 ENCOUNTER — Non-Acute Institutional Stay (SKILLED_NURSING_FACILITY): Payer: Medicare Other | Admitting: Internal Medicine

## 2013-07-30 DIAGNOSIS — I10 Essential (primary) hypertension: Secondary | ICD-10-CM

## 2013-07-30 DIAGNOSIS — E119 Type 2 diabetes mellitus without complications: Secondary | ICD-10-CM

## 2013-07-30 DIAGNOSIS — S8290XS Unspecified fracture of unspecified lower leg, sequela: Secondary | ICD-10-CM

## 2013-07-30 DIAGNOSIS — S82142S Displaced bicondylar fracture of left tibia, sequela: Secondary | ICD-10-CM

## 2013-07-30 DIAGNOSIS — M869 Osteomyelitis, unspecified: Secondary | ICD-10-CM
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
## 2013-08-02 ENCOUNTER — Non-Acute Institutional Stay (SKILLED_NURSING_FACILITY): Payer: Medicare Other | Admitting: Internal Medicine

## 2013-08-02 ENCOUNTER — Encounter: Payer: Self-pay | Admitting: Infectious Disease

## 2013-08-02 ENCOUNTER — Ambulatory Visit (INDEPENDENT_AMBULATORY_CARE_PROVIDER_SITE_OTHER): Payer: BC Managed Care – PPO | Admitting: Infectious Disease

## 2013-08-02 VITALS — BP 184/91 | HR 91 | Temp 97.8°F

## 2013-08-02 DIAGNOSIS — M869 Osteomyelitis, unspecified: Secondary | ICD-10-CM

## 2013-08-02 DIAGNOSIS — T8459XS Infection and inflammatory reaction due to other internal joint prosthesis, sequela: Secondary | ICD-10-CM

## 2013-08-02 DIAGNOSIS — Z23 Encounter for immunization: Secondary | ICD-10-CM

## 2013-08-02 DIAGNOSIS — T889XXS Complication of surgical and medical care, unspecified, sequela: Secondary | ICD-10-CM

## 2013-08-02 DIAGNOSIS — K5900 Constipation, unspecified: Secondary | ICD-10-CM
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
## 2013-08-02 NOTE — Progress Notes (Signed)
Subjective:    Patient ID: Edwin Shaffer, male    DOB: Jul 07, 1946, 67 y.o.   MRN: [id number]  HPI  Edwin Shaffer is a 67 y.o. male with PMHXof DM2 ('35), L TKR 04-23-13. He states he was home for 2 hours, then developed fever, wound drainage, wound swelling. He returned to the hospital, had his TKR resected and anbx impregnated spacer placed 05-23-13. His Cx were (-) at that time. He was d/c home on IV anbx (cefepime/vanco/rifampin) until 1 week ago. He was then changed to po doxy. He returns 11-18 with worsening swelling of his knee. He had fallen ~ 1.5 weeks pta. He was found on plain films to have a displaced spacer. On 11-19 he underwent removal of his spacer as well as necrotic bone/tissue. Repeat cultures were once again negatative.  He was broadened to vancomycin and merrem and rifamipin. He appears to still also be on doxy???  He is without pain at rest and is not weight bearing He is without fevers, or chills   Review of Systems  Constitutional: Negative for fever, chills, diaphoresis, activity change, appetite change, fatigue and unexpected weight change.  HENT: Negative for congestion, rhinorrhea, sinus pressure, sneezing, sore throat and trouble swallowing.   Eyes: Negative for photophobia and visual disturbance.  Respiratory: Negative for cough, chest tightness, shortness of breath, wheezing and stridor.   Cardiovascular: Negative for chest pain, palpitations and leg swelling.  Gastrointestinal: Negative for nausea, vomiting, abdominal pain, diarrhea, constipation, blood in stool, abdominal distention and anal bleeding.  Genitourinary: Negative for dysuria, hematuria, flank pain and difficulty urinating.  Musculoskeletal: Positive for joint swelling and myalgias. Negative for arthralgias, back pain and gait problem.  Skin: Positive for wound. Negative for color change, pallor and rash.  Neurological: Negative for dizziness, tremors, weakness and light-headedness.    Hematological: Negative for adenopathy. Does not bruise/bleed easily.  Psychiatric/Behavioral: Negative for behavioral problems, confusion, sleep disturbance, dysphoric mood, decreased concentration and agitation.       Objective:   Physical Exam  Constitutional: He is oriented to person, place, and time. He appears well-developed and well-nourished. No distress.  HENT:  Head: Normocephalic and atraumatic.  Mouth/Throat: Oropharynx is clear and moist. No oropharyngeal exudate.  Eyes: Conjunctivae and EOM are normal. Pupils are equal, round, and reactive to light. No scleral icterus.  Neck: Normal range of motion. Neck supple. No JVD present.  Cardiovascular: Normal rate, regular rhythm and normal heart sounds.  Exam reveals no gallop and no friction rub.   No murmur heard. Pulmonary/Chest: Effort normal and breath sounds normal. No respiratory distress. He has no wheezes. He has no rales. He exhibits no tenderness.  Abdominal: He exhibits no distension and no mass. There is no tenderness. There is no rebound and no guarding.  Musculoskeletal: He exhibits no edema and no tenderness.       Legs: Lymphadenopathy:    He has no cervical adenopathy.  Neurological: He is alert and oriented to person, place, and time. He exhibits normal muscle tone. Coordination normal.  Skin: Skin is warm and dry. He is not diaphoretic. No erythema. No pallor.     Psychiatric: He has a normal mood and affect. His behavior is normal. Judgment and thought content normal.            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

## 2013-08-04 ENCOUNTER — Other Ambulatory Visit: Payer: Self-pay

## 2013-08-04 LAB — BASIC METABOLIC PANEL
Anion Gap: 10 (ref 7–16)
BUN: 18 mg/dL (ref 7–18)
Calcium, Total: 8.7 mg/dL (ref 8.5–10.1)
EGFR (African American): >60
Potassium: 4.4 mmol/L (ref 3.5–5.1)
Sodium: 141 mmol/L (ref 136–145)

## 2013-08-14 ENCOUNTER — Non-Acute Institutional Stay (SKILLED_NURSING_FACILITY): Payer: Medicare Other | Admitting: Adult Health

## 2013-08-14 DIAGNOSIS — E1159 Type 2 diabetes mellitus with other circulatory complications: Secondary | ICD-10-CM

## 2013-08-14 DIAGNOSIS — I10 Essential (primary) hypertension: Secondary | ICD-10-CM

## 2013-08-15 LAB — FUNGUS CULTURE W SMEAR: Fungal Smear: NONE SEEN

## 2013-08-18 ENCOUNTER — Other Ambulatory Visit: Payer: Self-pay

## 2013-08-18 LAB — VANCOMYCIN, TROUGH: Vancomycin, Trough: 18 ug/mL (ref 10–20)

## 2013-08-18 LAB — BUN: BUN: 20 mg/dL — ABNORMAL HIGH (ref 7–18)

## 2013-08-18 LAB — CREATININE, SERUM
Creatinine: 1.15 mg/dL (ref 0.60–1.30)
EGFR (African American): >60
EGFR (Non-African Amer.): >60

## 2013-08-20 ENCOUNTER — Encounter: Payer: Self-pay | Admitting: Adult Health

## 2013-08-20 DIAGNOSIS — E1159 Type 2 diabetes mellitus with other circulatory complications: Secondary | ICD-10-CM | POA: Insufficient documentation

## 2013-08-20 MED ORDER — LISINOPRIL 20 MG PO TABS: 20.0000 mg | ORAL_TABLET | Freq: Two times a day (BID) | ORAL | Status: AC
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
## 2013-08-24 ENCOUNTER — Other Ambulatory Visit: Payer: Self-pay | Admitting: Orthopedic Surgery

## 2013-08-24 LAB — CREATININE, SERUM
Creatinine: 1.15 mg/dL (ref 0.60–1.30)
EGFR (Non-African Amer.): >60

## 2013-08-24 LAB — BUN: BUN: 20 mg/dL — ABNORMAL HIGH (ref 7–18)

## 2013-08-27 ENCOUNTER — Non-Acute Institutional Stay (SKILLED_NURSING_FACILITY): Payer: Medicare Other | Admitting: Adult Health

## 2013-08-27 ENCOUNTER — Encounter: Payer: Self-pay | Admitting: Adult Health

## 2013-08-27 DIAGNOSIS — E1159 Type 2 diabetes mellitus with other circulatory complications: Secondary | ICD-10-CM

## 2013-08-27 DIAGNOSIS — M1712 Unilateral primary osteoarthritis, left knee: Secondary | ICD-10-CM

## 2013-08-27 DIAGNOSIS — I10 Essential (primary) hypertension: Secondary | ICD-10-CM

## 2013-08-27 DIAGNOSIS — M1710 Unilateral primary osteoarthritis, unspecified knee: Secondary | ICD-10-CM

## 2013-08-27 MED ORDER — DILTIAZEM HCL ER BEADS 360 MG PO CP24: 300.0000 mg | ORAL_CAPSULE | Freq: Every day | ORAL | Status: AC
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
## 2013-08-28 ENCOUNTER — Other Ambulatory Visit: Payer: Self-pay | Admitting: *Deleted

## 2013-08-28 MED ORDER — OXYCODONE-ACETAMINOPHEN 5-325 MG PO TABS: ORAL_TABLET | ORAL | Status: DC

## 2013-08-30 ENCOUNTER — Other Ambulatory Visit: Payer: Self-pay

## 2013-08-30 LAB — CREATININE, SERUM: Creatinine: 1.16 mg/dL (ref 0.60–1.30)

## 2013-08-30 LAB — AFB CULTURE WITH SMEAR (NOT AT ARMC): Acid Fast Smear: NONE SEEN

## 2013-08-30 LAB — BUN: BUN: 20 mg/dL — ABNORMAL HIGH (ref 7–18)

## 2013-09-03 ENCOUNTER — Encounter: Payer: Self-pay | Admitting: Adult Health

## 2013-09-03 ENCOUNTER — Non-Acute Institutional Stay: Payer: Medicare Other | Admitting: Adult Health

## 2013-09-03 DIAGNOSIS — IMO0002 Reserved for concepts with insufficient information to code with codable children: Secondary | ICD-10-CM

## 2013-09-03 DIAGNOSIS — N400 Enlarged prostate without lower urinary tract symptoms: Secondary | ICD-10-CM

## 2013-09-03 DIAGNOSIS — M1712 Unilateral primary osteoarthritis, left knee: Secondary | ICD-10-CM

## 2013-09-03 DIAGNOSIS — Z5189 Encounter for other specified aftercare: Secondary | ICD-10-CM

## 2013-09-03 DIAGNOSIS — I10 Essential (primary) hypertension: Secondary | ICD-10-CM

## 2013-09-03 DIAGNOSIS — M1710 Unilateral primary osteoarthritis, unspecified knee: Secondary | ICD-10-CM

## 2013-09-03 DIAGNOSIS — E1159 Type 2 diabetes mellitus with other circulatory complications: Secondary | ICD-10-CM

## 2013-09-03 DIAGNOSIS — T8454XD Infection and inflammatory reaction due to internal left knee prosthesis, subsequent encounter: Secondary | ICD-10-CM

## 2013-09-03 DIAGNOSIS — K5900 Constipation, unspecified: Secondary | ICD-10-CM

## 2013-09-03 MED ORDER — HYDROCHLOROTHIAZIDE 25 MG PO TABS: 12.5000 mg | ORAL_TABLET | Freq: Every day | ORAL | Status: AC
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
## 2013-09-04 ENCOUNTER — Other Ambulatory Visit: Payer: Self-pay

## 2013-09-05 ENCOUNTER — Ambulatory Visit: Payer: Medicare Other | Admitting: Infectious Diseases

## 2013-09-05 ENCOUNTER — Telehealth: Payer: Self-pay | Admitting: *Deleted

## 2013-09-05 ENCOUNTER — Non-Acute Institutional Stay: Payer: Medicare Other | Admitting: Adult Health

## 2013-09-05 DIAGNOSIS — T8454XD Infection and inflammatory reaction due to internal left knee prosthesis, subsequent encounter: Secondary | ICD-10-CM

## 2013-09-05 DIAGNOSIS — E1159 Type 2 diabetes mellitus with other circulatory complications: Secondary | ICD-10-CM

## 2013-09-05 DIAGNOSIS — T8454XS Infection and inflammatory reaction due to internal left knee prosthesis, sequela: Secondary | ICD-10-CM

## 2013-09-05 DIAGNOSIS — IMO0002 Reserved for concepts with insufficient information to code with codable children: Secondary | ICD-10-CM

## 2013-09-05 DIAGNOSIS — M1712 Unilateral primary osteoarthritis, left knee: Secondary | ICD-10-CM

## 2013-09-05 DIAGNOSIS — Z5189 Encounter for other specified aftercare: Secondary | ICD-10-CM

## 2013-09-05 DIAGNOSIS — M1710 Unilateral primary osteoarthritis, unspecified knee: Secondary | ICD-10-CM

## 2013-09-05 DIAGNOSIS — I10 Essential (primary) hypertension: Secondary | ICD-10-CM

## 2013-09-05 LAB — BASIC METABOLIC PANEL
Anion Gap: 4 — ABNORMAL LOW (ref 7–16)
BUN: 22 mg/dL — ABNORMAL HIGH (ref 7–18)
CALCIUM: 9 mg/dL (ref 8.5–10.1)
CO2: 27 mmol/L (ref 21–32)
Chloride: 107 mmol/L (ref 98–107)
Creatinine: 1.17 mg/dL (ref 0.60–1.30)
EGFR (African American): >60
EGFR (Non-African Amer.): >60
GLUCOSE: 131 mg/dL — AB (ref 65–99)
Osmolality: 281 (ref 275–301)
Potassium: 4.3 mmol/L (ref 3.5–5.1)
Sodium: 138 mmol/L (ref 136–145)

## 2013-09-05 LAB — VANCOMYCIN, TROUGH: Vancomycin, Trough: 19 ug/mL (ref 10–20)

## 2013-09-05 MED ORDER — DOXYCYCLINE HYCLATE 100 MG PO TABS: 100.0000 mg | ORAL_TABLET | Freq: Two times a day (BID) | ORAL | Status: DC

## 2013-09-05 MED ORDER — LEVOFLOXACIN 750 MG PO TABS: 750.0000 mg | ORAL_TABLET | Freq: Every day | ORAL | Status: DC

## 2013-09-05 NOTE — Telephone Encounter (Signed)
Patient to be discharged 09/06/13 from [facility] - he has reached his 100 days of coverage.  Per Dr. Tommy Medal, patient can stop the IV vancomycin and merrem, have his PICC pulled today.  Patient is to continue the rifampin as prescribed and add Doxycycline 100mg  BID and Levaquin 750mg  daily.   Confirmed these orders with Lisabeth Pick, Librarian, academic at [facility].  Patient is scheduled for [facility] follow up 09/11/13 at 10:30 with Dr. Tommy Medal, patient is aware. Landis Gandy, RN

## 2013-09-09 ENCOUNTER — Encounter: Payer: Self-pay | Admitting: Adult Health
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
## 2013-09-11 ENCOUNTER — Ambulatory Visit: Payer: BC Managed Care – PPO | Admitting: Infectious Disease
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
## 2013-09-12 ENCOUNTER — Ambulatory Visit: Payer: BC Managed Care – PPO | Admitting: Infectious Disease

## 2013-09-19 ENCOUNTER — Encounter: Payer: Self-pay | Admitting: Internal Medicine

## 2013-09-20 ENCOUNTER — Encounter: Payer: Self-pay | Admitting: Internal Medicine

## 2013-09-25 ENCOUNTER — Other Ambulatory Visit: Payer: Self-pay | Admitting: Orthopedic Surgery

## 2013-09-25 NOTE — Pre-Procedure Instructions (Signed)
DAVION FLANNERY  09/25/2013   Your procedure is scheduled on: Monday, Feb. 2nd    Report to [facility]  2 * 3 at 5:30 AM.  Call this number if you have problems the morning of surgery: [phone]   Remember:   Do not eat food or drink liquids after midnight Sunday.   Take these medicines the morning of surgery with A SIP OF WATER: Flomax, Oxycodone, Diltiazem   Do not wear jewelry-no rings or watches.  Do not wear lotions or colognes.  You may NOT wear deodorant.   Men may shave face and neck.   Do not bring valuables to the hospital.  [facility] is not responsible for any belongings or valuables.               Contacts, dentures or bridgework may not be worn into surgery.  Leave suitcase in the car. After surgery it may be brought to your room.  For patients admitted to the hospital, discharge time is determined by your  treatment team.              Name and phone number of your driver:                                                                                                                                                                                                                                                                               Special Instructions: Shower using CHG 2 nights before surgery and the night before surgery.  If you shower the day of surgery use CHG.  Use special wash - you have one bottle of CHG for all showers.  You should use approximately 1/3 of the bottle for each shower.   Please read over the following fact sheets that you were given: Pain Booklet and Surgical Site Infection Prevention

## 2013-09-26 ENCOUNTER — Encounter (HOSPITAL_COMMUNITY): Payer: Self-pay

## 2013-09-26 ENCOUNTER — Encounter (HOSPITAL_COMMUNITY)
Admission: RE | Admit: 2013-09-26 | Discharge: 2013-09-26 | Disposition: A | Discharge status: Home / Self Care | Payer: BC Managed Care – PPO | Source: Ambulatory Visit | Attending: Orthopedic Surgery | Admitting: Orthopedic Surgery

## 2013-09-26 DIAGNOSIS — Z01818 Encounter for other preprocedural examination: Secondary | ICD-10-CM | POA: Diagnosis present

## 2013-09-26 DIAGNOSIS — Z01812 Encounter for preprocedural laboratory examination: Secondary | ICD-10-CM | POA: Diagnosis not present

## 2013-09-26 HISTORY — DX: Hypothyroidism, unspecified: E03.9

## 2013-09-26 HISTORY — DX: Benign prostatic hyperplasia without lower urinary tract symptoms: N40.0

## 2013-09-26 HISTORY — DX: Osteomyelitis, unspecified: M86.9

## 2013-09-26 LAB — CBC WITH DIFFERENTIAL/PLATELET
BASOS ABS: 0 10*3/uL (ref 0.0–0.1)
Basophils Relative: 1 % (ref 0–1)
EOS PCT: 4 % (ref 0–5)
Eosinophils Absolute: 0.3 10*3/uL (ref 0.0–0.7)
HEMATOCRIT: 34.6 % — AB (ref 39.0–52.0)
HEMOGLOBIN: 11.6 g/dL — AB (ref 13.0–17.0)
LYMPHS ABS: 2.8 10*3/uL (ref 0.7–4.0)
LYMPHS PCT: 39 % (ref 12–46)
MCH: 26.7 pg (ref 26.0–34.0)
MCHC: 33.5 g/dL (ref 30.0–36.0)
MCV: 79.7 fL (ref 78.0–100.0)
MONOS PCT: 10 % (ref 3–12)
Monocytes Absolute: 0.7 10*3/uL (ref 0.1–1.0)
Neutro Abs: 3.4 10*3/uL (ref 1.7–7.7)
Neutrophils Relative %: 47 % (ref 43–77)
Platelets: 287 10*3/uL (ref 150–400)
RBC: 4.34 MIL/uL (ref 4.22–5.81)
RDW: 15.5 % (ref 11.5–15.5)
WBC: 7.1 10*3/uL (ref 4.0–10.5)

## 2013-09-26 LAB — BASIC METABOLIC PANEL
BUN: 43 mg/dL — ABNORMAL HIGH (ref 6–23)
CHLORIDE: 100 meq/L (ref 96–112)
CO2: 21 meq/L (ref 19–32)
Calcium: 9.7 mg/dL (ref 8.4–10.5)
Creatinine, Ser: 1.49 mg/dL — ABNORMAL HIGH (ref 0.50–1.35)
GFR calc Af Amer: 55 mL/min — ABNORMAL LOW (ref >90)
GFR calc non Af Amer: 48 mL/min — ABNORMAL LOW (ref >90)
Glucose, Bld: 130 mg/dL — ABNORMAL HIGH (ref 70–99)
Potassium: 4.4 mEq/L (ref 3.7–5.3)
SODIUM: 138 meq/L (ref 137–147)

## 2013-09-26 LAB — URINALYSIS, ROUTINE W REFLEX MICROSCOPIC
Bilirubin Urine: NEGATIVE
Hgb urine dipstick: NEGATIVE
Ketones, ur: NEGATIVE mg/dL
LEUKOCYTES UA: NEGATIVE
NITRITE: NEGATIVE
Protein, ur: NEGATIVE mg/dL
SPECIFIC GRAVITY, URINE: 1.025 (ref 1.005–1.030)
Urobilinogen, UA: 0.2 mg/dL (ref 0.0–1.0)
pH: 5 (ref 5.0–8.0)

## 2013-09-26 LAB — URINE MICROSCOPIC-ADD ON

## 2013-09-26 LAB — PROTIME-INR
INR: 1.09 (ref 0.00–1.49)
Prothrombin Time: 13.9 seconds (ref 11.6–15.2)

## 2013-09-26 LAB — APTT: aPTT: 33 seconds (ref 24–37)

## 2013-09-26 NOTE — Progress Notes (Signed)
STOP-Bang score 5; results sent to PCP ,Dr. Brynda Greathouse in [place]
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
## 2013-09-30 MED ORDER — DEXTROSE 5 % IV SOLN
3.0000 g | INTRAVENOUS | Status: AC
  Administered 2013-10-01: 3 g via INTRAVENOUS
  Filled 2013-09-30: qty 3000

## 2013-10-01 ENCOUNTER — Inpatient Hospital Stay (HOSPITAL_COMMUNITY)
Admission: RE | Admit: 2013-10-01 | Discharge: 2013-10-04 | DRG: 488 | Disposition: A | Discharge status: Skilled Nursing Facility | Payer: BC Managed Care – PPO | Source: Ambulatory Visit | Attending: Orthopedic Surgery | Admitting: Orthopedic Surgery

## 2013-10-01 ENCOUNTER — Encounter (HOSPITAL_COMMUNITY): Payer: Self-pay | Admitting: Surgery

## 2013-10-01 ENCOUNTER — Encounter (HOSPITAL_COMMUNITY)
Admission: RE | Disposition: A | Discharge status: Skilled Nursing Facility | Payer: Self-pay | Source: Ambulatory Visit | Attending: Orthopedic Surgery

## 2013-10-01 ENCOUNTER — Inpatient Hospital Stay (HOSPITAL_COMMUNITY): Payer: BC Managed Care – PPO

## 2013-10-01 ENCOUNTER — Encounter (HOSPITAL_COMMUNITY): Payer: BC Managed Care – PPO | Admitting: Anesthesiology

## 2013-10-01 ENCOUNTER — Inpatient Hospital Stay (HOSPITAL_COMMUNITY): Payer: BC Managed Care – PPO | Admitting: Anesthesiology

## 2013-10-01 DIAGNOSIS — M21869 Other specified acquired deformities of unspecified lower leg: Principal | ICD-10-CM | POA: Diagnosis present

## 2013-10-01 DIAGNOSIS — D62 Acute posthemorrhagic anemia: Secondary | ICD-10-CM | POA: Diagnosis not present

## 2013-10-01 DIAGNOSIS — Z981 Arthrodesis status: Secondary | ICD-10-CM

## 2013-10-01 DIAGNOSIS — N400 Enlarged prostate without lower urinary tract symptoms: Secondary | ICD-10-CM | POA: Diagnosis present

## 2013-10-01 DIAGNOSIS — Z8521 Personal history of malignant neoplasm of larynx: Secondary | ICD-10-CM

## 2013-10-01 DIAGNOSIS — I10 Essential (primary) hypertension: Secondary | ICD-10-CM | POA: Diagnosis present

## 2013-10-01 DIAGNOSIS — Z96659 Presence of unspecified artificial knee joint: Secondary | ICD-10-CM

## 2013-10-01 DIAGNOSIS — E119 Type 2 diabetes mellitus without complications: Secondary | ICD-10-CM | POA: Diagnosis present

## 2013-10-01 DIAGNOSIS — T84018A Broken internal joint prosthesis, other site, initial encounter: Secondary | ICD-10-CM | POA: Insufficient documentation

## 2013-10-01 DIAGNOSIS — E039 Hypothyroidism, unspecified: Secondary | ICD-10-CM | POA: Diagnosis present

## 2013-10-01 DIAGNOSIS — Z87891 Personal history of nicotine dependence: Secondary | ICD-10-CM

## 2013-10-01 DIAGNOSIS — M25569 Pain in unspecified knee: Secondary | ICD-10-CM | POA: Diagnosis present

## 2013-10-01 HISTORY — PX: TOTAL KNEE ARTHROPLASTY: SHX125

## 2013-10-01 HISTORY — PX: KNEE ARTHRODESIS: SHX1857

## 2013-10-01 HISTORY — DX: Nausea with vomiting, unspecified: R11.2

## 2013-10-01 HISTORY — DX: Adverse effect of unspecified anesthetic, initial encounter: T41.45XA

## 2013-10-01 HISTORY — DX: Other specified postprocedural states: Z98.890

## 2013-10-01 HISTORY — DX: Other complications of anesthesia, initial encounter: T88.59XA

## 2013-10-01 LAB — GRAM STAIN

## 2013-10-01 LAB — GLUCOSE, CAPILLARY
GLUCOSE-CAPILLARY: 143 mg/dL — AB (ref 70–99)
Glucose-Capillary: 170 mg/dL — ABNORMAL HIGH (ref 70–99)
Glucose-Capillary: 257 mg/dL — ABNORMAL HIGH (ref 70–99)
Glucose-Capillary: 235 mg/dL — ABNORMAL HIGH (ref 70–99)

## 2013-10-01 LAB — HEMOGLOBIN AND HEMATOCRIT, BLOOD
HCT: 25.4 % — ABNORMAL LOW (ref 39.0–52.0)
HEMATOCRIT: 27.6 % — AB (ref 39.0–52.0)
Hemoglobin: 8.4 g/dL — ABNORMAL LOW (ref 13.0–17.0)
Hemoglobin: 9.1 g/dL — ABNORMAL LOW (ref 13.0–17.0)

## 2013-10-01 SURGERY — FUSION, JOINT, KNEE
Anesthesia: General | Site: Knee | Laterality: Left

## 2013-10-01 MED ORDER — SODIUM CHLORIDE 0.9 % IR SOLN
Status: DC | PRN
  Administered 2013-10-01: 3000 mL

## 2013-10-01 MED ORDER — SUCCINYLCHOLINE CHLORIDE 20 MG/ML IJ SOLN
INTRAMUSCULAR | Status: AC
  Filled 2013-10-01: qty 1

## 2013-10-01 MED ORDER — DEXTROSE-NACL 5-0.45 % IV SOLN
INTRAVENOUS | Status: DC
  Administered 2013-10-01: 175 mL/h via INTRAVENOUS

## 2013-10-01 MED ORDER — GLYCOPYRROLATE 0.2 MG/ML IJ SOLN
INTRAMUSCULAR | Status: AC
  Filled 2013-10-01: qty 3

## 2013-10-01 MED ORDER — MENTHOL 3 MG MT LOZG
1.0000 | LOZENGE | OROMUCOSAL | Status: DC | PRN
  Filled 2013-10-01: qty 9

## 2013-10-01 MED ORDER — PROMETHAZINE HCL 25 MG/ML IJ SOLN
INTRAMUSCULAR | Status: AC
  Filled 2013-10-01: qty 1

## 2013-10-01 MED ORDER — METOCLOPRAMIDE HCL 10 MG PO TABS: 5.0000 mg | ORAL_TABLET | Freq: Three times a day (TID) | ORAL | Status: DC | PRN

## 2013-10-01 MED ORDER — MIDAZOLAM HCL 2 MG/2ML IJ SOLN
INTRAMUSCULAR | Status: AC
  Filled 2013-10-01: qty 2

## 2013-10-01 MED ORDER — NEOSTIGMINE METHYLSULFATE 1 MG/ML IJ SOLN
INTRAMUSCULAR | Status: AC
  Filled 2013-10-01: qty 10

## 2013-10-01 MED ORDER — POLYETHYLENE GLYCOL 3350 17 G PO PACK
17.0000 g | PACK | Freq: Every day | ORAL | Status: DC
  Administered 2013-10-01 – 2013-10-04 (×4): 17 g via ORAL
  Filled 2013-10-01 (×4): qty 1

## 2013-10-01 MED ORDER — METHOCARBAMOL 100 MG/ML IJ SOLN
500.0000 mg | Freq: Four times a day (QID) | INTRAVENOUS | Status: DC | PRN
  Filled 2013-10-01: qty 5

## 2013-10-01 MED ORDER — INSULIN GLARGINE 100 UNIT/ML ~~LOC~~ SOLN
20.0000 [IU] | Freq: Every day | SUBCUTANEOUS | Status: DC
  Administered 2013-10-01 – 2013-10-03 (×3): 20 [IU] via SUBCUTANEOUS
  Filled 2013-10-01 (×4): qty 0.2

## 2013-10-01 MED ORDER — METHOCARBAMOL 500 MG PO TABS
ORAL_TABLET | ORAL | Status: AC
  Filled 2013-10-01: qty 1

## 2013-10-01 MED ORDER — HYDROMORPHONE HCL PF 1 MG/ML IJ SOLN
INTRAMUSCULAR | Status: AC
  Filled 2013-10-01: qty 1

## 2013-10-01 MED ORDER — ONDANSETRON HCL 4 MG PO TABS
4.0000 mg | ORAL_TABLET | Freq: Four times a day (QID) | ORAL | Status: DC | PRN
Start: 2013-10-01 — End: 2013-10-04

## 2013-10-01 MED ORDER — HYDROMORPHONE HCL PF 1 MG/ML IJ SOLN
1.0000 mg | INTRAMUSCULAR | Status: DC | PRN
  Administered 2013-10-02 (×2): 1 mg via INTRAVENOUS
  Filled 2013-10-01: qty 1

## 2013-10-01 MED ORDER — BISACODYL 5 MG PO TBEC: 5.0000 mg | DELAYED_RELEASE_TABLET | Freq: Every day | ORAL | Status: DC | PRN

## 2013-10-01 MED ORDER — DEXTROSE 5 % IV SOLN
10.0000 mg | INTRAVENOUS | Status: DC | PRN
  Administered 2013-10-01: 15 ug/min via INTRAVENOUS

## 2013-10-01 MED ORDER — DILTIAZEM HCL ER BEADS 300 MG PO CP24
300.0000 mg | ORAL_CAPSULE | Freq: Every day | ORAL | Status: DC
Start: 2013-10-01 — End: 2013-10-04
  Administered 2013-10-01 – 2013-10-04 (×4): 300 mg via ORAL
  Filled 2013-10-01 (×4): qty 1

## 2013-10-01 MED ORDER — DOXYCYCLINE HYCLATE 100 MG PO TABS
100.0000 mg | ORAL_TABLET | Freq: Two times a day (BID) | ORAL | Status: DC
  Administered 2013-10-01 – 2013-10-04 (×6): 100 mg via ORAL
  Filled 2013-10-01 (×7): qty 1

## 2013-10-01 MED ORDER — DIPHENHYDRAMINE HCL 12.5 MG/5ML PO ELIX: 12.5000 mg | ORAL_SOLUTION | ORAL | Status: DC | PRN

## 2013-10-01 MED ORDER — ACETAMINOPHEN 650 MG RE SUPP: 650.0000 mg | Freq: Four times a day (QID) | RECTAL | Status: DC | PRN

## 2013-10-01 MED ORDER — ONDANSETRON HCL 4 MG/2ML IJ SOLN
INTRAMUSCULAR | Status: DC | PRN
  Administered 2013-10-01: 4 mg via INTRAVENOUS

## 2013-10-01 MED ORDER — OXYCODONE HCL 5 MG PO TABS
5.0000 mg | ORAL_TABLET | ORAL | Status: DC | PRN
  Administered 2013-10-01 – 2013-10-02 (×7): 10 mg via ORAL
  Administered 2013-10-03: 5 mg via ORAL
  Administered 2013-10-03 – 2013-10-04 (×2): 10 mg via ORAL
  Administered 2013-10-04: 5 mg via ORAL
  Administered 2013-10-04: 10 mg via ORAL
  Filled 2013-10-01: qty 2
  Filled 2013-10-01: qty 1
  Filled 2013-10-01 (×4): qty 2
  Filled 2013-10-01: qty 1
  Filled 2013-10-01 (×5): qty 2

## 2013-10-01 MED ORDER — ONDANSETRON HCL 4 MG/2ML IJ SOLN
INTRAMUSCULAR | Status: AC
  Filled 2013-10-01: qty 2

## 2013-10-01 MED ORDER — METHOCARBAMOL 500 MG PO TABS
500.0000 mg | ORAL_TABLET | Freq: Four times a day (QID) | ORAL | Status: DC | PRN
  Administered 2013-10-01 – 2013-10-04 (×6): 500 mg via ORAL
  Filled 2013-10-01 (×6): qty 1

## 2013-10-01 MED ORDER — HYDROCHLOROTHIAZIDE 25 MG PO TABS
12.5000 mg | ORAL_TABLET | Freq: Every day | ORAL | Status: DC
  Administered 2013-10-01 – 2013-10-04 (×4): 12.5 mg via ORAL
  Filled 2013-10-01 (×4): qty 0.5

## 2013-10-01 MED ORDER — ARTIFICIAL TEARS OP OINT
TOPICAL_OINTMENT | OPHTHALMIC | Status: DC | PRN
  Administered 2013-10-01: 1 via OPHTHALMIC

## 2013-10-01 MED ORDER — GLYCOPYRROLATE 0.2 MG/ML IJ SOLN
INTRAMUSCULAR | Status: DC | PRN
  Administered 2013-10-01: 0.4 mg via INTRAVENOUS

## 2013-10-01 MED ORDER — EPHEDRINE SULFATE 50 MG/ML IJ SOLN
INTRAMUSCULAR | Status: AC
  Filled 2013-10-01: qty 1

## 2013-10-01 MED ORDER — ARTIFICIAL TEARS OP OINT
TOPICAL_OINTMENT | OPHTHALMIC | Status: AC
  Filled 2013-10-01: qty 3.5

## 2013-10-01 MED ORDER — TAMSULOSIN HCL 0.4 MG PO CAPS
0.4000 mg | ORAL_CAPSULE | Freq: Every day | ORAL | Status: DC
Start: 2013-10-01 — End: 2013-10-04
  Administered 2013-10-01 – 2013-10-04 (×4): 0.4 mg via ORAL
  Filled 2013-10-01 (×4): qty 1

## 2013-10-01 MED ORDER — OXYCODONE HCL 5 MG/5ML PO SOLN: 5.0000 mg | Freq: Once | ORAL | Status: AC | PRN

## 2013-10-01 MED ORDER — ALBUMIN HUMAN 5 % IV SOLN
12.5000 g | Freq: Once | INTRAVENOUS | Status: AC
  Administered 2013-10-01: 12.5 g via INTRAVENOUS

## 2013-10-01 MED ORDER — LACTATED RINGERS IV SOLN
INTRAVENOUS | Status: DC | PRN
  Administered 2013-10-01 (×2): via INTRAVENOUS

## 2013-10-01 MED ORDER — PHENOL 1.4 % MT LIQD: 1.0000 | OROMUCOSAL | Status: DC | PRN

## 2013-10-01 MED ORDER — ROCURONIUM BROMIDE 50 MG/5ML IV SOLN
INTRAVENOUS | Status: AC
  Filled 2013-10-01: qty 1

## 2013-10-01 MED ORDER — LIDOCAINE HCL (CARDIAC) 20 MG/ML IV SOLN
INTRAVENOUS | Status: DC | PRN
  Administered 2013-10-01: 60 mg via INTRAVENOUS

## 2013-10-01 MED ORDER — DOCUSATE SODIUM 100 MG PO CAPS
100.0000 mg | ORAL_CAPSULE | Freq: Two times a day (BID) | ORAL | Status: DC
  Administered 2013-10-01 – 2013-10-04 (×6): 100 mg via ORAL
  Filled 2013-10-01 (×8): qty 1

## 2013-10-01 MED ORDER — NEOSTIGMINE METHYLSULFATE 1 MG/ML IJ SOLN
INTRAMUSCULAR | Status: DC | PRN
  Administered 2013-10-01: 3 mg via INTRAVENOUS

## 2013-10-01 MED ORDER — MIDAZOLAM HCL 5 MG/5ML IJ SOLN
INTRAMUSCULAR | Status: DC | PRN
  Administered 2013-10-01 (×2): 1 mg via INTRAVENOUS

## 2013-10-01 MED ORDER — ALBUMIN HUMAN 5 % IV SOLN
INTRAVENOUS | Status: AC
  Filled 2013-10-01: qty 250

## 2013-10-01 MED ORDER — SENNOSIDES-DOCUSATE SODIUM 8.6-50 MG PO TABS: 1.0000 | ORAL_TABLET | Freq: Every evening | ORAL | Status: DC | PRN

## 2013-10-01 MED ORDER — INSULIN ASPART 100 UNIT/ML ~~LOC~~ SOLN
0.0000 [IU] | Freq: Three times a day (TID) | SUBCUTANEOUS | Status: DC
  Administered 2013-10-01: 11 [IU] via SUBCUTANEOUS
  Administered 2013-10-02: 7 [IU] via SUBCUTANEOUS
  Administered 2013-10-02: 11 [IU] via SUBCUTANEOUS
  Administered 2013-10-02 – 2013-10-03 (×2): 7 [IU] via SUBCUTANEOUS
  Administered 2013-10-03: 4 [IU] via SUBCUTANEOUS
  Administered 2013-10-03: 7 [IU] via SUBCUTANEOUS
  Administered 2013-10-04: 4 [IU] via SUBCUTANEOUS
  Administered 2013-10-04: 3 [IU] via SUBCUTANEOUS

## 2013-10-01 MED ORDER — ASPIRIN EC 325 MG PO TBEC
325.0000 mg | DELAYED_RELEASE_TABLET | Freq: Every day | ORAL | Status: DC
  Administered 2013-10-02 – 2013-10-04 (×3): 325 mg via ORAL
  Filled 2013-10-01 (×4): qty 1

## 2013-10-01 MED ORDER — ONDANSETRON HCL 4 MG/2ML IJ SOLN
4.0000 mg | Freq: Four times a day (QID) | INTRAMUSCULAR | Status: DC | PRN
  Administered 2013-10-01 – 2013-10-03 (×4): 4 mg via INTRAVENOUS
  Filled 2013-10-01 (×3): qty 2

## 2013-10-01 MED ORDER — LISINOPRIL 20 MG PO TABS
20.0000 mg | ORAL_TABLET | Freq: Two times a day (BID) | ORAL | Status: DC
  Administered 2013-10-01 – 2013-10-04 (×6): 20 mg via ORAL
  Filled 2013-10-01 (×7): qty 1

## 2013-10-01 MED ORDER — PHENYLEPHRINE HCL 10 MG/ML IJ SOLN
INTRAMUSCULAR | Status: DC | PRN
  Administered 2013-10-01 (×3): 40 ug via INTRAVENOUS
  Administered 2013-10-01: 80 ug via INTRAVENOUS
  Administered 2013-10-01: 120 ug via INTRAVENOUS

## 2013-10-01 MED ORDER — 0.9 % SODIUM CHLORIDE (POUR BTL) OPTIME
TOPICAL | Status: DC | PRN
  Administered 2013-10-01: 1000 mL

## 2013-10-01 MED ORDER — ALUM & MAG HYDROXIDE-SIMETH 200-200-20 MG/5ML PO SUSP
30.0000 mL | ORAL | Status: DC | PRN
  Administered 2013-10-03: 30 mL via ORAL
  Filled 2013-10-01: qty 30

## 2013-10-01 MED ORDER — FLEET ENEMA 7-19 GM/118ML RE ENEM: 1.0000 | ENEMA | Freq: Once | RECTAL | Status: AC | PRN

## 2013-10-01 MED ORDER — PROPOFOL 10 MG/ML IV BOLUS
INTRAVENOUS | Status: DC | PRN
  Administered 2013-10-01: 130 mg via INTRAVENOUS

## 2013-10-01 MED ORDER — PROPOFOL 10 MG/ML IV BOLUS
INTRAVENOUS | Status: AC
  Filled 2013-10-01: qty 20

## 2013-10-01 MED ORDER — ACETAMINOPHEN 325 MG PO TABS: 650.0000 mg | ORAL_TABLET | Freq: Four times a day (QID) | ORAL | Status: DC | PRN

## 2013-10-01 MED ORDER — PROMETHAZINE HCL 25 MG/ML IJ SOLN
6.2500 mg | INTRAMUSCULAR | Status: DC | PRN
  Administered 2013-10-01: 12.5 mg via INTRAVENOUS

## 2013-10-01 MED ORDER — ROCURONIUM BROMIDE 100 MG/10ML IV SOLN
INTRAVENOUS | Status: DC | PRN
  Administered 2013-10-01: 40 mg via INTRAVENOUS

## 2013-10-01 MED ORDER — LEVOFLOXACIN 750 MG PO TABS
750.0000 mg | ORAL_TABLET | Freq: Every day | ORAL | Status: DC
  Administered 2013-10-01 – 2013-10-04 (×4): 750 mg via ORAL
  Filled 2013-10-01 (×4): qty 1

## 2013-10-01 MED ORDER — FENTANYL CITRATE 0.05 MG/ML IJ SOLN
INTRAMUSCULAR | Status: AC
  Filled 2013-10-01: qty 5

## 2013-10-01 MED ORDER — KCL IN DEXTROSE-NACL 20-5-0.45 MEQ/L-%-% IV SOLN
INTRAVENOUS | Status: DC
  Administered 2013-10-01 – 2013-10-03 (×6): via INTRAVENOUS
  Filled 2013-10-01 (×15): qty 1000

## 2013-10-01 MED ORDER — ROPIVACAINE HCL 5 MG/ML IJ SOLN
INTRAMUSCULAR | Status: DC | PRN
  Administered 2013-10-01: 30 mL via PERINEURAL

## 2013-10-01 MED ORDER — OXYCODONE HCL 5 MG PO TABS: 5.0000 mg | ORAL_TABLET | Freq: Once | ORAL | Status: AC | PRN

## 2013-10-01 MED ORDER — HYDROMORPHONE HCL PF 1 MG/ML IJ SOLN
0.2500 mg | INTRAMUSCULAR | Status: DC | PRN
  Administered 2013-10-01: 0.5 mg via INTRAVENOUS

## 2013-10-01 MED ORDER — CHLORHEXIDINE GLUCONATE 4 % EX LIQD: 1.0000 "application " | Freq: Once | CUTANEOUS | Status: DC

## 2013-10-01 MED ORDER — METOCLOPRAMIDE HCL 5 MG/ML IJ SOLN: 5.0000 mg | Freq: Three times a day (TID) | INTRAMUSCULAR | Status: DC | PRN

## 2013-10-01 MED ORDER — OXYCODONE HCL 5 MG PO TABS
ORAL_TABLET | ORAL | Status: AC
  Filled 2013-10-01: qty 1

## 2013-10-01 MED ORDER — ALBUMIN HUMAN 5 % IV SOLN
INTRAVENOUS | Status: DC | PRN
  Administered 2013-10-01: 10:00:00 via INTRAVENOUS

## 2013-10-01 MED ORDER — CHLORHEXIDINE GLUCONATE 4 % EX LIQD: 60.0000 mL | Freq: Once | CUTANEOUS | Status: DC

## 2013-10-01 MED ORDER — DAPAGLIFLOZIN PROPANEDIOL 5 MG PO TABS
5.0000 mg | ORAL_TABLET | Freq: Every day | ORAL | Status: DC
  Administered 2013-10-04: 5 mg via ORAL
  Filled 2013-10-01 (×2): qty 1

## 2013-10-01 MED ORDER — CLONIDINE HCL 0.1 MG PO TABS
0.1000 mg | ORAL_TABLET | Freq: Four times a day (QID) | ORAL | Status: DC | PRN
  Filled 2013-10-01: qty 1

## 2013-10-01 MED ORDER — LIDOCAINE HCL (CARDIAC) 20 MG/ML IV SOLN
INTRAVENOUS | Status: AC
  Filled 2013-10-01: qty 5

## 2013-10-01 MED ORDER — STERILE WATER FOR INJECTION IJ SOLN
INTRAMUSCULAR | Status: AC
  Filled 2013-10-01: qty 10

## 2013-10-01 MED ORDER — FENTANYL CITRATE 0.05 MG/ML IJ SOLN
INTRAMUSCULAR | Status: DC | PRN
  Administered 2013-10-01: 100 ug via INTRAVENOUS
  Administered 2013-10-01: 50 ug via INTRAVENOUS

## 2013-10-01 SURGICAL SUPPLY — 68 items
BANDAGE ESMARK 6X9 LF (GAUZE/BANDAGES/DRESSINGS) ×1 IMPLANT
BLADE SAG 18X100X1.27 (BLADE) ×3 IMPLANT
BLADE SAW SGTL 13X75X1.27 (BLADE) ×3 IMPLANT
BLADE SURG ROTATE 9660 (MISCELLANEOUS) IMPLANT
BNDG CMPR 9X6 STRL LF SNTH (GAUZE/BANDAGES/DRESSINGS) ×1
BNDG CMPR MED 10X6 ELC LF (GAUZE/BANDAGES/DRESSINGS) ×1
BNDG ELASTIC 6X10 VLCR STRL LF (GAUZE/BANDAGES/DRESSINGS) ×3 IMPLANT
BNDG ESMARK 6X9 LF (GAUZE/BANDAGES/DRESSINGS) ×3
BOWL SMART MIX CTS (DISPOSABLE) ×1 IMPLANT
CLOTH BEACON ORANGE TIMEOUT ST (SAFETY) ×3 IMPLANT
COMPONET FEMORAL WICHITA 18MM (Knees) ×2 IMPLANT
COVER SURGICAL LIGHT HANDLE (MISCELLANEOUS) ×3 IMPLANT
CUFF TOURNIQUET SINGLE 34IN LL (TOURNIQUET CUFF) ×2 IMPLANT
CUFF TOURNIQUET SINGLE 44IN (TOURNIQUET CUFF) IMPLANT
DRAPE C-ARM 42X72 X-RAY (DRAPES) ×3 IMPLANT
DRAPE C-ARMOR (DRAPES) ×3 IMPLANT
DRAPE EXTREMITY T 121X128X90 (DRAPE) ×3 IMPLANT
DRAPE ORTHO SPLIT 77X108 STRL (DRAPES) ×3
DRAPE SURG ORHT 6 SPLT 77X108 (DRAPES) IMPLANT
DRAPE U-SHAPE 47X51 STRL (DRAPES) ×3 IMPLANT
DURAPREP 26ML APPLICATOR (WOUND CARE) ×3 IMPLANT
ELECT REM PT RETURN 9FT ADLT (ELECTROSURGICAL) ×3
ELECTRODE REM PT RTRN 9FT ADLT (ELECTROSURGICAL) ×1 IMPLANT
EVACUATOR 1/8 PVC DRAIN (DRAIN) ×3 IMPLANT
GAUZE XEROFORM 1X8 LF (GAUZE/BANDAGES/DRESSINGS) ×3 IMPLANT
GLOVE BIO SURGEON STRL SZ7.5 (GLOVE) ×3 IMPLANT
GLOVE BIO SURGEON STRL SZ8.5 (GLOVE) ×3 IMPLANT
GLOVE BIOGEL PI IND STRL 8 (GLOVE) ×1 IMPLANT
GLOVE BIOGEL PI IND STRL 9 (GLOVE) ×1 IMPLANT
GLOVE BIOGEL PI INDICATOR 8 (GLOVE) ×2
GLOVE BIOGEL PI INDICATOR 9 (GLOVE) ×2
GOWN PREVENTION PLUS XLARGE (GOWN DISPOSABLE) ×3 IMPLANT
GOWN STRL NON-REIN LRG LVL3 (GOWN DISPOSABLE) ×3 IMPLANT
GOWN STRL REIN XL XLG (GOWN DISPOSABLE) ×3 IMPLANT
HANDPIECE INTERPULSE COAX TIP (DISPOSABLE) ×3
HOOD PEEL AWAY FACE SHEILD DIS (HOOD) ×6 IMPLANT
KIT BASIN OR (CUSTOM PROCEDURE TRAY) ×3 IMPLANT
KIT ROOM TURNOVER OR (KITS) ×3 IMPLANT
MANIFOLD NEPTUNE II (INSTRUMENTS) ×3 IMPLANT
NAIL FUSION WICHITA 12MM (Nail) ×2 IMPLANT
NDL SAFETY ECLIPSE 18X1.5 (NEEDLE) IMPLANT
NDL SPNL 18GX3.5 QUINCKE PK (NEEDLE) IMPLANT
NEEDLE HYPO 18GX1.5 SHARP (NEEDLE)
NEEDLE SPNL 18GX3.5 QUINCKE PK (NEEDLE) ×3 IMPLANT
NS IRRIG 1000ML POUR BTL (IV SOLUTION) ×3 IMPLANT
PACK TOTAL JOINT (CUSTOM PROCEDURE TRAY) ×3 IMPLANT
PAD ARMBOARD 7.5X6 YLW CONV (MISCELLANEOUS) ×6 IMPLANT
PADDING CAST COTTON 6X4 STRL (CAST SUPPLIES) ×3 IMPLANT
SCREW COMPRESSION WHICITA (Screw) ×2 IMPLANT
SCREW SELF TAP 6.0X30 (Screw) ×4 IMPLANT
SCREW SELFTAP WICHITA 6MX35M (Screw) ×4 IMPLANT
SCREW SELFTAP WICHITA 6MX40M (Screw) ×2 IMPLANT
SET HNDPC FAN SPRY TIP SCT (DISPOSABLE) ×1 IMPLANT
SPONGE GAUZE 4X4 12PLY (GAUZE/BANDAGES/DRESSINGS) ×6 IMPLANT
STAPLER VISISTAT 35W (STAPLE) ×3 IMPLANT
SUCTION FRAZIER TIP 10 FR DISP (SUCTIONS) ×3 IMPLANT
SUT VIC AB 0 CTX 36 (SUTURE) ×3
SUT VIC AB 0 CTX36XBRD ANTBCTR (SUTURE) ×1 IMPLANT
SUT VIC AB 1 CT1 27 (SUTURE) ×3
SUT VIC AB 1 CT1 27XBRD ANBCTR (SUTURE) IMPLANT
SUT VIC AB 1 CTX 36 (SUTURE) ×6
SUT VIC AB 1 CTX36XBRD ANBCTR (SUTURE) ×1 IMPLANT
SUT VIC AB 2-0 CT1 27 (SUTURE) ×3
SUT VIC AB 2-0 CT1 TAPERPNT 27 (SUTURE) ×1 IMPLANT
SYR 50ML LL SCALE MARK (SYRINGE) ×1 IMPLANT
TOWEL OR 17X24 6PK STRL BLUE (TOWEL DISPOSABLE) ×3 IMPLANT
TOWEL OR 17X26 10 PK STRL BLUE (TOWEL DISPOSABLE) ×3 IMPLANT
WATER STERILE IRR 1000ML POUR (IV SOLUTION) ×2 IMPLANT

## 2013-10-01 NOTE — Anesthesia Postprocedure Evaluation (Signed)
  Anesthesia Post-op Note  Patient: Edwin Shaffer  Procedure(s) Performed: Procedure(s): ARTHRODESIS KNEE (Left)  Patient Location: PACU  Anesthesia Type:GA combined with regional for post-op pain  Level of Consciousness: awake and alert   Airway and Oxygen Therapy: Patient Spontanous Breathing  Post-op Pain: mild  Post-op Assessment: Post-op Vital signs reviewed, Patient's Cardiovascular Status Stable and Respiratory Function Stable  Post-op Vital Signs: stable  Complications: No apparent anesthesia complications

## 2013-10-01 NOTE — Op Note (Signed)
Pre Op Dx: Failed left total knee arthroplasty with recurrent infections after removal of implants and placement of PMMA spacer with fracture proximal tibia  Post Op Dx: Same  Procedure: Left knee radical irrigation and debridement, patellectomy many, arthrodesis using the [place] nail  Surgeon: Kerin Salen, MD  Assistant: Kerry Hough. Barton Dubois  (present throughout entire procedure and necessary for timely completion of the procedure)  Anesthesia: General  EBL: 400 cc  Fluids: 2 L of crystalloid  Tourniquet Time: One hour and 50 minutes  Indications: Patient primary total knee arthroplasty on the left in August of 2014. Had early postoperative infection, had radical irrigation and debridement with Polly change in infection recurred and continued we never grew any organisms, parts were removed after approximately 6 weeks and infection persisted and the patient fractured the proximal medial tibia with a PMMA spacer in place. All components removed and he received IV antibiotics a few months ago swelling has gone down and the skin is soft and he no longer looks actively infected he is taken for repeat irrigation and debridement and placement of [place] arthrodesis nail risks and benefits of surgery been discussed questions answered.  Procedure: Patient identified by arm band receive 3 g Ancef preoperatively IV, taken to the operating room and appropriate anesthetic monitors were attached and general endotracheal anesthesia induced with the patient in supine position. Tourniquet was applied high to the left thigh and the left lower extremity was prepped and draped in usual sterile fashion from the toes to the tourniquet. Timeout procedure was performed limb was wrapped with an [place] bandage tourniquet inflated to 350 mm of mercury. We began the procedure by recreating the anterior midline incision through the skin and subcutaneous tissue and performing a medial parapatellar arthrotomy.  Large amounts of scar tissue as well as inflamed synovium and bloody fluid were removed from the suprapatellar pouch and around the tibia and femur. At this point the patella itself was excised to enhance exposure. We continued to work around medially and laterally and also elevated tissue off the proximal medial flare of the tibia and finally were able to flex the knee to approximately 90 and entered the distal femur with the intramedullary rod allowing [place] to make a 0 distal femoral cut 5 posterior slope. The step drill followed by the [facility] nail femoral reamer to the appropriate depth. The femoral part of the nail itself was then inserted with a driving platform and a proximal locking screws placed. We then direct her attention distally and and using the [place] male tibial reamer reaming the tibia to the appropriate depth and then removed the anterior bone block to allow insertion of the tibial portion of the nail on the inserter. With the inserter in place we then placed the tibial portion of the nail locking screws after first reducing the stem of the tibial nail into the femur and setting are rotation. We then used a compression screw to compress the fusion site with rigid fixation. Excess bone was trimmed of the bone block was then placed back in the proximal tibia and the tourniquet was let down after placing drains. Bleeders were identified and cauterized with then closed in layers with running 1-0 Vicryl suture 2-0 Vicryl suture and staples followed by a dressing of Xerofoam 4 x 4 dressing sponges web roll and an Ace wrap. The screw insertion holes were closed with simple 3-0 nylon suture. We did send specimens of the fluid to the lab and he came  back occasional monos and polys no organisms.

## 2013-10-01 NOTE — Preoperative (Signed)
Beta Blockers   Reason not to administer Beta Blockers:Not Applicable 

## 2013-10-01 NOTE — Transfer of Care (Signed)
Immediate Anesthesia Transfer of Care Note  Patient: Edwin Shaffer  Procedure(s) Performed: Procedure(s): ARTHRODESIS KNEE (Left)  Patient Location: PACU  Anesthesia Type:General  Level of Consciousness: awake, alert  and oriented  Airway & Oxygen Therapy: Patient Spontanous Breathing and Patient connected to nasal cannula oxygen  Post-op Assessment: Report given to PACU RN and Post -op Vital signs reviewed and stable  Post vital signs: Reviewed and stable  Complications: No apparent anesthesia complications

## 2013-10-01 NOTE — Anesthesia Preprocedure Evaluation (Signed)
Anesthesia Evaluation  Patient identified by MRN, date of birth, ID band Patient awake    Reviewed: Allergy & Precautions, H&P , NPO status , Patient's Chart, lab work & pertinent test results  Airway Mallampati: II  Neck ROM: Full    Dental   Pulmonary former smoker,          Cardiovascular hypertension, + Peripheral Vascular Disease Rhythm:Regular Rate:Normal     Neuro/Psych    GI/Hepatic   Endo/Other  diabetes, Type 2, Insulin Dependent  Renal/GU      Musculoskeletal   Abdominal   Peds  Hematology  (+) Blood dyscrasia, ,   Anesthesia Other Findings   Reproductive/Obstetrics                           Anesthesia Physical Anesthesia Plan  ASA: II  Anesthesia Plan: General   Post-op Pain Management:    Induction: Intravenous  Airway Management Planned: Oral ETT  Additional Equipment:   Intra-op Plan:   Post-operative Plan: Extubation in OR  Informed Consent: I have reviewed the patients History and Physical, chart, labs and discussed the procedure including the risks, benefits and alternatives for the proposed anesthesia with the patient or authorized representative who has indicated his/her understanding and acceptance.     Plan Discussed with: CRNA and Surgeon  Anesthesia Plan Comments:         Anesthesia Quick Evaluation

## 2013-10-01 NOTE — Interval H&P Note (Signed)
History and Physical Interval Note:  10/01/2013 7:12 AM  Forrest Moron  has presented today for surgery, with the diagnosis of S/P LEFT TKA REMOVAL OF SPACER/OSTEOMYLITIS  The various methods of treatment have been discussed with the patient and family. After consideration of risks, benefits and other options for treatment, the patient has consented to  Procedure(s): ARTHRODESIS KNEE (Left) as a surgical intervention .  The patient's history has been reviewed, patient examined, no change in status, stable for surgery.  I have reviewed the patient's chart and labs.  Questions were answered to the patient's satisfaction.     Kerin Salen

## 2013-10-01 NOTE — Anesthesia Procedure Notes (Addendum)
Anesthesia Regional Block:  Femoral nerve block  Pre-Anesthetic Checklist: ,, timeout performed, Correct Patient, Correct Site, Correct Laterality, Correct Procedure, Correct Position, site marked, Risks and benefits discussed, pre-op evaluation,  At surgeon's request and post-op pain management  Laterality: Upper  Prep: chloraprep       Needles:  Injection technique: Single-shot  Needle Type: Echogenic Needle      Needle Gauge: 22 and 22 G  Needle insertion depth: 6 cm   Additional Needles:  Procedures: ultrasound guided (picture in chart) and nerve stimulator Femoral nerve block  Nerve Stimulator or Paresthesia:  Response: Twitch elicited, 0.8 mA,   Additional Responses:   Narrative:  Start time: 10/01/2013 7:05 AM End time: 10/01/2013 7:21 AM Injection made incrementally with aspirations every 5 mL.  Performed by: Personally  Anesthesiologist: Bartolo Darter, MD  Additional Notes: BP cuff, EKG monitors applied. Sedation begun. Femoral artery palpated for location of nerve. After nerve location anesthetic injected incrementally, slowly , and after neg aspirations. Tolerated well.   Procedure Name: Intubation Date/Time: 10/01/2013 7:39 AM Performed by: Erik Obey Pre-anesthesia Checklist: Patient identified, Emergency Drugs available, Suction available, Patient being monitored and Timeout performed Patient Re-evaluated:Patient Re-evaluated prior to inductionOxygen Delivery Method: Circle system utilized Preoxygenation: Pre-oxygenation with 100% oxygen Intubation Type: IV induction Ventilation: Mask ventilation without difficulty Laryngoscope Size: Mac and 3 Grade View: Grade I Tube type: Oral Tube size: 7.5 mm Number of attempts: 1 Airway Equipment and Method: Stylet Placement Confirmation: ETT inserted through vocal cords under direct vision,  positive ETCO2 and breath sounds checked- equal and bilateral Secured at: 23 cm Tube secured with: Tape Dental Injury:  Teeth and Oropharynx as per pre-operative assessment  Comments: Atraumatic intubation by Yetta Flock, SRNA.

## 2013-10-02 ENCOUNTER — Encounter (HOSPITAL_COMMUNITY): Payer: Self-pay | Admitting: General Practice

## 2013-10-02 LAB — CBC
HEMATOCRIT: 24.4 % — AB (ref 39.0–52.0)
Hemoglobin: 8.1 g/dL — ABNORMAL LOW (ref 13.0–17.0)
MCH: 26.6 pg (ref 26.0–34.0)
MCHC: 33.2 g/dL (ref 30.0–36.0)
MCV: 80 fL (ref 78.0–100.0)
Platelets: 229 10*3/uL (ref 150–400)
RBC: 3.05 MIL/uL — ABNORMAL LOW (ref 4.22–5.81)
RDW: 16 % — AB (ref 11.5–15.5)
WBC: 10.3 10*3/uL (ref 4.0–10.5)

## 2013-10-02 LAB — HEMOGLOBIN A1C
Hgb A1c MFr Bld: 7.8 % — ABNORMAL HIGH (ref <5.7)
Mean Plasma Glucose: 177 mg/dL — ABNORMAL HIGH (ref <117)

## 2013-10-02 LAB — GLUCOSE, CAPILLARY
GLUCOSE-CAPILLARY: 232 mg/dL — AB (ref 70–99)
GLUCOSE-CAPILLARY: 209 mg/dL — AB (ref 70–99)
GLUCOSE-CAPILLARY: 191 mg/dL — AB (ref 70–99)
Glucose-Capillary: 273 mg/dL — ABNORMAL HIGH (ref 70–99)

## 2013-10-02 NOTE — Progress Notes (Addendum)
Inpatient Diabetes Program Recommendations  AACE/ADA: New Consensus Statement on Inpatient Glycemic Control (2013)  Target Ranges:  Prepandial:   less than 140 mg/dL      Peak postprandial:   less than 180 mg/dL (1-2 hours)      Critically ill patients:  140 - 180 mg/dL   Hyperglycemia in 200 range:  Inpatient Diabetes Program Recommendations Insulin - Basal: Please add 70/30 to insulin regimen of 10 units in the am and 10 units ac supper.  (Home dose 75/25 is 45 units bid) Oral Agents: Please do not use Farxiga while here. At this point, he has elevated BUN/Creat which may not be safe for this patient at this time. Thank you, Rosita Kea, RN, CNS, Diabetes Coordinator [phone]) Ad @1705 : Just notified by RN that there is question regarding urine output since this am. Definitely recommend not giving the Farxiga at this time as we do not know the kidney/bladder status. Typically it is not recommended to use oral agents for DM while in the hospital. RN did notify me that the BUN and Creat are from January of 2015. However, using Farxiga in combination with insulin at this point could put the patient at risk for hypoglycemia. Thank you, Rosita Kea, RN, CNS, Diabetes Coordinator [phone])

## 2013-10-02 NOTE — Evaluation (Signed)
Physical Therapy Evaluation Patient Details Name: Edwin Shaffer MRN: [id number] DOB: [birth year] Today's Date: 10/02/2013 Time: 6073-7106 PT Time Calculation (min): 19 min  PT Assessment / Plan / Recommendation History of Present Illness  Multiple readmissions due to infected L knee; now s/p arthrodesis  Clinical Impression  Patient is s/p above surgery resulting in functional limitations due to the deficits listed below (see PT Problem List).  Patient will benefit from skilled PT to increase their independence and safety with mobility to allow discharge to the venue listed below.       PT Assessment  Patient needs continued PT services    Follow Up Recommendations  SNF    Does the patient have the potential to tolerate intense rehabilitation      Barriers to Discharge        Equipment Recommendations  None recommended by PT    Recommendations for Other Services     Frequency Min 3X/week    Precautions / Restrictions Precautions Precautions: Fall Restrictions LLE Weight Bearing: Weight bearing as tolerated   Pertinent Vitals/Pain 6-7/10 L knee;  patient repositioned for comfort       Mobility  Transfers Overall transfer level: Needs assistance Equipment used: Rolling walker (2 wheeled) Transfers: Sit to/from Stand Sit to Stand: Mod assist;+2 safety/equipment General transfer comment: Ceus for safety, technqiue, hand placement Ambulation/Gait Ambulation/Gait assistance: Min assist;+2 safety/equipment Ambulation Distance (Feet): 15 Feet Assistive device: Rolling walker (2 wheeled) Gait Pattern/deviations: Step-to pattern Gait velocity: slow General Gait Details: Does not tolerate much weight through LLE; tending to keep NWB, but able to place L foot on floor with cues    Exercises General Exercises - Lower Extremity Ankle Circles/Pumps: AROM;Both;10 reps   PT Diagnosis: Difficulty walking;Acute pain  PT Problem List: Decreased strength;Decreased range of  motion;Decreased activity tolerance;Decreased balance;Decreased mobility;Decreased knowledge of use of DME;Pain;Decreased knowledge of precautions PT Treatment Interventions: DME instruction;Gait training;Functional mobility training;Therapeutic activities;Therapeutic exercise;Patient/family education     PT Goals(Current goals can be found in the care plan section) Acute Rehab PT Goals Patient Stated Goal: get rid of the infection; he wants to work hard to walk PT Goal Formulation: With patient Time For Goal Achievement: 10/16/13 Potential to Achieve Goals: Good  Visit Information  Last PT Received On: 10/02/13 History of Present Illness: Multiple readmissions due to infected L knee; now s/p arthrodesis       Prior Functioning  Home Living Family/patient expects to be discharged to:: Skilled nursing facility Living Arrangements: Spouse/significant other Additional Comments: has walk in shower Prior Function Level of Independence: Independent with assistive device(s) Comments: prior to returning home after original Lt TKA  Communication Communication: No difficulties Dominant Hand: Left    Cognition  Cognition Arousal/Alertness: Awake/alert Behavior During Therapy: WFL for tasks assessed/performed Overall Cognitive Status: Within Functional Limits for tasks assessed    Extremity/Trunk Assessment Upper Extremity Assessment Upper Extremity Assessment: Overall WFL for tasks assessed Lower Extremity Assessment Lower Extremity Assessment: Generalized weakness;LLE deficits/detail LLE Deficits / Details: knee fused; Painful LLE, pt tending to keep weight off    Balance Balance Overall balance assessment: Needs assistance Sitting-balance support: Bilateral upper extremity supported Sitting balance-Leahy Scale: Fair Standing balance support: Bilateral upper extremity supported Standing balance-Leahy Scale: Fair Standing balance comment: Heavy dependence on UE support on RW  End  of Session PT - End of Session Equipment Utilized During Treatment: Gait belt Activity Tolerance: Patient tolerated treatment well;Patient limited by fatigue Patient left: in chair;with call bell/phone within reach Nurse Communication: Mobility  status  GP     Roney Marion [facility] [place], [place]  10/02/2013, 12:26 PM

## 2013-10-02 NOTE — Progress Notes (Signed)
Patient ID: Edwin Shaffer, male   DOB: October 27, 1945, 68 y.o.   MRN: [id number] PATIENT ID: Edwin Shaffer  MRN: [id number]  DOB/AGE:  22-May-1946 / 68 y.o.  1 Day Post-Op Procedure(s) (LRB): ARTHRODESIS KNEE (Left)    PROGRESS NOTE Subjective: Patient is alert, oriented, no Nausea, 2x Vomiting, yes passing gas, yes Bowel Movement. Taking PO Sips. Denies SOB, Chest or Calf Pain. Using Incentive Spirometer, PAS in place. Ambulate Weightbearing as tolerated with walker, No CPM this patient now has a knee effusionPatient reports pain as 5 on 0-10 scale  .    Objective: Vital signs in last 24 hours: Filed Vitals:   10/01/13 1541 10/01/13 2119 10/02/13 0200 10/02/13 0600  BP: 114/72 120/64 120/61 101/70  Pulse:  85 115 113  Temp: 97.7 F (36.5 C) 98.8 F (37.1 C) 99.9 F (37.7 C) 98.9 F (37.2 C)  TempSrc: Oral     Resp: 16 16 18 18   SpO2: 100% 100% 99% 100%      Intake/Output from previous day: I/O last 3 completed shifts: In: 4514.6 [P.O.:360; I.V.:3454.6; IV Piggyback:700] Out: 2845 [Urine:1350; Drains:995; Blood:500]   Intake/Output this shift:     LABORATORY DATA:  Recent Labs  10/01/13 1554 10/01/13 1847 10/01/13 2123 10/02/13 0640 10/02/13 0645  WBC  --   --   --   --  10.3  HGB  --  9.1*  --   --  8.1*  HCT  --  27.6*  --   --  24.4*  PLT  --   --   --   --  229  GLUCAP 257*  --  235* 232*  --     Examination: Neurologically intact ABD soft Neurovascular intact Sensation intact distally Intact pulses distally Dorsiflexion/Plantar flexion intact Incision: no drainage No cellulitis present Compartment soft}  Assessment:   1 Day Post-Op Procedure(s) (LRB): ARTHRODESIS KNEE (Left) ADDITIONAL DIAGNOSIS:  Acute Blood Loss Anemia, Diabetes, Hypertension and status post removal of infected total knee, osteomyelitis, presently on Levaquin  Plan: PT/OT WBAT, Continue Levaquin pending, Gram stain and culture results from surgery, which are pending  DVT  Prophylaxis:  SCDx72hrs, ASA 325 mg BID x 2 weeks DISCHARGE PLAN: Skilled Nursing Facility/Rehab, Patient and family prefer [facility] DISCHARGE NEEDS: HHPT, HHRN, CPM, Walker and 3-in-1 comode seat     Kashius Dominic J 10/02/2013, 7:54 AM

## 2013-10-02 NOTE — Care Management Note (Signed)
CARE MANAGEMENT NOTE 10/02/2013  Patient:  Edwin Shaffer, Edwin Shaffer   Account Number:  [id number]  Date Initiated:  10/02/2013  Documentation initiated by:  Ricki Miller  Subjective/Objective Assessment:   67 yr old male s/p left total Knee arthrodesis and removal of spacer.     Action/Plan:   Patient will need shortterm rehab [facility], wants [facility]. Social worker aware.   Anticipated DC Date:  10/03/2013   Anticipated DC Plan:  SKILLED NURSING FACILITY  In-house referral  Clinical Social Worker      DC Planning Services  CM consult      Choice offered to / List presented to:     DME arranged  NA        [place] arranged  NA      Status of service:  Completed, signed off Medicare Important Message given?   (If response is "NO", the following Medicare IM given date fields will be blank) Date Medicare IM given:   Date Additional Medicare IM given:    Discharge Disposition:  [place]  Per UR Regulation:

## 2013-10-02 NOTE — Progress Notes (Signed)
Pt unable to void since foley D/Cd at 0630. Bladder scan showed 29 cc but Pt was starting to fill full. I/O cath done at this time, 300 cc urine obtained. Will continue to monitor Pts output.

## 2013-10-03 LAB — BASIC METABOLIC PANEL
BUN: 42 mg/dL — ABNORMAL HIGH (ref 6–23)
CHLORIDE: 99 meq/L (ref 96–112)
CO2: 18 mEq/L — ABNORMAL LOW (ref 19–32)
CREATININE: 1.49 mg/dL — AB (ref 0.50–1.35)
Calcium: 8.2 mg/dL — ABNORMAL LOW (ref 8.4–10.5)
GFR calc non Af Amer: 48 mL/min — ABNORMAL LOW (ref >90)
GFR, EST AFRICAN AMERICAN: 55 mL/min — AB (ref >90)
Glucose, Bld: 224 mg/dL — ABNORMAL HIGH (ref 70–99)
POTASSIUM: 4.8 meq/L (ref 3.7–5.3)
Sodium: 131 mEq/L — ABNORMAL LOW (ref 137–147)

## 2013-10-03 LAB — GLUCOSE, CAPILLARY
GLUCOSE-CAPILLARY: 217 mg/dL — AB (ref 70–99)
Glucose-Capillary: 214 mg/dL — ABNORMAL HIGH (ref 70–99)
Glucose-Capillary: 189 mg/dL — ABNORMAL HIGH (ref 70–99)
Glucose-Capillary: 169 mg/dL — ABNORMAL HIGH (ref 70–99)

## 2013-10-03 LAB — CBC
HCT: 20.5 % — ABNORMAL LOW (ref 39.0–52.0)
HEMOGLOBIN: 7 g/dL — AB (ref 13.0–17.0)
MCH: 27.3 pg (ref 26.0–34.0)
MCHC: 34.1 g/dL (ref 30.0–36.0)
MCV: 80.1 fL (ref 78.0–100.0)
Platelets: 186 10*3/uL (ref 150–400)
RBC: 2.56 MIL/uL — AB (ref 4.22–5.81)
RDW: 16 % — ABNORMAL HIGH (ref 11.5–15.5)
WBC: 11.6 10*3/uL — ABNORMAL HIGH (ref 4.0–10.5)

## 2013-10-03 LAB — PREPARE RBC (CROSSMATCH)

## 2013-10-03 MED ORDER — METHOCARBAMOL 500 MG PO TABS: 500.0000 mg | ORAL_TABLET | Freq: Two times a day (BID) | ORAL | Status: AC

## 2013-10-03 MED ORDER — OXYCODONE-ACETAMINOPHEN 5-325 MG PO TABS: ORAL_TABLET | ORAL | Status: DC

## 2013-10-03 MED ORDER — ASPIRIN EC 325 MG PO TBEC: 325.0000 mg | DELAYED_RELEASE_TABLET | Freq: Two times a day (BID) | ORAL | Status: AC

## 2013-10-03 NOTE — Progress Notes (Signed)
Clinical Social Work Department BRIEF PSYCHOSOCIAL ASSESSMENT 10/03/2013  Patient:  Edwin Shaffer, Edwin Shaffer     Account Number:  [id number]     Admit date:  10/01/2013  Clinical Social Worker:  Adair Laundry  Date/Time:  10/03/2013 12:00 N  Referred by:    Date Referred:  10/03/2013 Referred for  SNF Placement   Other Referral:   Interview type:  Patient Other interview type:    PSYCHOSOCIAL DATA Living Status:  WIFE Admitted from facility:   Level of care:   Primary support name:  [facility] Primary support relationship to patient:  SPOUSE Degree of support available:   Pt has good support system    CURRENT CONCERNS Current Concerns  Post-Acute Placement   Other Concerns:    SOCIAL WORK ASSESSMENT / PLAN CSW informed pt will need SNF. CSW spoke with pt who informed CSW he is aware of referal and has contacted [facility]. Pt has been to [facility] before and would be happy to return to get his therapy. CSW had already received call prior to assessment from facility informing that they can accept pt. Facility has started [facility].   Assessment/plan status:  Psychosocial Support/Ongoing Assessment of Needs Other assessment/ plan:   Information/referral to community resources:   None needed at this time    PATIENT'S/FAMILY'S RESPONSE TO PLAN OF CARE: Pt is agreeable to SNF       [place], [place]

## 2013-10-03 NOTE — Progress Notes (Signed)
Physical Therapy Treatment Patient Details Name: DEVION CHRISCOE MRN: [id number] DOB: [birth year] Today's Date: 10/03/2013 Time: 2671-2458 PT Time Calculation (min): 35 min  PT Assessment / Plan / Recommendation  History of Present Illness Multiple readmissions due to infected L knee; now s/p arthrodesis   PT Comments   Making steady progress with amb; Still quite hesitant to WB through LLE  Follow Up Recommendations  SNF     Does the patient have the potential to tolerate intense rehabilitation     Barriers to Discharge        Equipment Recommendations  None recommended by PT    Recommendations for Other Services    Frequency Min 3X/week   Progress towards PT Goals Progress towards PT goals: Progressing toward goals  Plan Current plan remains appropriate    Precautions / Restrictions Precautions Precautions: Fall Restrictions LLE Weight Bearing: Weight bearing as tolerated   Pertinent Vitals/Pain Described Heartburn-like pain during session;  VitalsBP 134/73, HR 122, O2 sat on Room Air 98%    Mobility  Bed Mobility Overal bed mobility: Needs Assistance Bed Mobility: Supine to Sit;Sit to Supine Supine to sit: Min assist Sit to supine: Mod assist General bed mobility comments: Cues for technique and min assist for LLE Transfers Overall transfer level: Needs assistance Equipment used: Rolling walker (2 wheeled) Transfers: Sit to/from Stand Sit to Stand: Mod assist;+2 safety/equipment General transfer comment: Ceus for safety, technqiue, hand placement Ambulation/Gait Ambulation/Gait assistance: +2 safety/equipment;Min assist Ambulation Distance (Feet): 25 Feet Assistive device: Rolling walker (2 wheeled) Gait Pattern/deviations: Step-to pattern Gait velocity: slow General Gait Details: Does not tolerate much weight through LLE; tending to keep NWB, but able to place L foot on floor with cues    Exercises     PT Diagnosis:    PT Problem List:   PT Treatment  Interventions:     PT Goals (current goals can now be found in the care plan section) Acute Rehab PT Goals Patient Stated Goal: get rid of the infection; he wants to work hard to walk PT Goal Formulation: With patient Time For Goal Achievement: 10/16/13 Potential to Achieve Goals: Good  Visit Information  Last PT Received On: 10/03/13 Assistance Needed: +2 (helpful for amb) History of Present Illness: Multiple readmissions due to infected L knee; now s/p arthrodesis    Subjective Data  Subjective: wanting to get to bathroom to move bowels Patient Stated Goal: get rid of the infection; he wants to work hard to walk   [employer] [facility] Arousal/Alertness: Awake/alert Behavior During Therapy: WFL for tasks assessed/performed Overall Cognitive Status: Within Functional Limits for tasks assessed    Balance     End of Session PT - End of Session Equipment Utilized During Treatment: Gait belt Activity Tolerance: Patient tolerated treatment well;Patient limited by fatigue Patient left: in chair;with call bell/phone within reach Nurse Communication: Mobility status   GP     Quin Hoop [place], [place]  10/03/2013, 4:48 PM

## 2013-10-03 NOTE — Progress Notes (Signed)
PATIENT ID: Edwin Shaffer  MRN: [id number]  DOB/AGE:  Apr 12, 1946 / 68 y.o.  2 Days Post-Op Procedure(s) (LRB): ARTHRODESIS KNEE (Left)    PROGRESS NOTE Subjective: Patient is alert, oriented, no Nausea, no Vomiting, yes passing gas, no Bowel Movement. Taking PO well, pt up eating in bed. Denies SOB, Chest or Calf Pain. Using Incentive Spirometer, PAS in place. Ambulate WBAT,  Patient reports pain as mild  .    Objective: Vital signs in last 24 hours: Filed Vitals:   10/02/13 2128 10/03/13 0000 10/03/13 0400 10/03/13 0439  BP: 121/70   127/63  Pulse: 108   108  Temp: 98.9 F (37.2 C)   97.5 F (36.4 C)  TempSrc:      Resp: 18 18 18 18   SpO2: 98% 98% 97% 100%      Intake/Output from previous day: I/O last 3 completed shifts: In: 3060 [P.O.:960; I.V.:2100] Out: 2400 [Urine:2150; Drains:250]   Intake/Output this shift:     LABORATORY DATA:  Recent Labs  10/02/13 0645  10/02/13 1638 10/02/13 2140 10/03/13 0555 10/03/13 0636  WBC 10.3  --   --   --  11.6*  --   HGB 8.1*  --   --   --  7.0*  --   HCT 24.4*  --   --   --  20.5*  --   PLT 229  --   --   --  186  --   NA  --   --   --   --  131*  --   K  --   --   --   --  4.8  --   CL  --   --   --   --  99  --   CO2  --   --   --   --  18*  --   BUN  --   --   --   --  42*  --   CREATININE  --   --   --   --  1.49*  --   GLUCOSE  --   --   --   --  224*  --   GLUCAP  --   < > 209* 191*  --  214*  CALCIUM  --   --   --   --  8.2*  --   < > = values in this interval not displayed.  Examination: Neurologically intact ABD soft Neurovascular intact Sensation intact distally Intact pulses distally Dorsiflexion/Plantar flexion intact Incision: dressing C/D/I No cellulitis present Compartment soft} Blood and plasma separated in drain indicating minimal recent drainage, drain pulled without difficulty.  Assessment:   2 Days Post-Op Procedure(s) (LRB): ARTHRODESIS KNEE (Left) ADDITIONAL DIAGNOSIS:  Acute Blood  Loss Anemia, Diabetes, Hypertension and Status post removal of infected total knee, osteomyelitis, presently on levaquin.  Plan: PT/OT WBAT, continue levaquin DVT Prophylaxis:  SCDx72hrs, ASA 325 mg BID x 2 weeks DISCHARGE PLAN: Skilled Nursing Facility/Rehab, [facility]  DISCHARGE NEEDS: HHPT, [place], Walker and 3-in-1 comode seat  Acute blood loss anemia -  Orders written to transfuse 1 unit.     Ermagene Saidi R 10/03/2013, 8:09 AM

## 2013-10-03 NOTE — Progress Notes (Signed)
AACE/ADA: New Consensus Statement on Inpatient Glycemic Control (2013)  Target Ranges:  Prepandial:   less than 140 mg/dL      Peak postprandial:   less than 180 mg/dL (1-2 hours)      Critically ill patients:  140 - 180 mg/dL   Hyperglycemia continues in 200 range: [place] consistently > 200 mg/dL  Inpatient Diabetes Program Recommendations Insulin - Either increase lantus to 25 units or:  Basal: Please add 70/30 to insulin regimen of 10 units in the am and 10 units ac supper.  (Home dose 75/25 is 45 units bid)  Oral Agents: Please discontinue the Farxiega (SGL2 inhibitor) which is mode of action effects the kidneys and may not be safe for the patient. At this point, he still has elevated BUN/Creat which may not be safe for this patient at this time.  Thank you, Rosita Kea, RN, CNS, Diabetes Coordinator [phone]) I Oral Agents: Please do not use Farxiga while here. At this point, he has elevated BUN/Creat which may not be safe for this patient at this time.  Thank you, Rosita Kea, RN, CNS, Diabetes Coordinator [phone])

## 2013-10-03 NOTE — Evaluation (Signed)
Occupational Therapy Evaluation Patient Details Name: Edwin Shaffer MRN: [id number] DOB: 21-Oct-1945 Today's Date: 10/03/2013 Time: 4193-7902 OT Time Calculation (min): 28 min  OT Assessment / Plan / Recommendation History of present illness Multiple readmissions due to infected L knee; now s/p arthrodesis   Clinical Impression   Pt is admitted s/p L knee arthrodesis secondary to infected L knee. A chair level assessment was performed today secondary to pt getting blood (hgb 7.0). He presents with deficits in his ability to perform ADL's and self care tasks independently (see problem list) and will benefit from acute OT followed by SNF rehab to assist in maximizing independence in ADL, self care and functional mobility for transfers. Assess toilet transfers/functional mobility and LB ADL's next visit as pt medically able.    OT Assessment  Patient needs continued OT Services    Follow Up Recommendations  SNF;Supervision/Assistance - 24 hour    Barriers to Discharge      Equipment Recommendations  Other (comment) (Defer to next venue)    Recommendations for Other Services    Frequency  Min 2X/week    Precautions / Restrictions Precautions Precautions: Fall Restrictions Weight Bearing Restrictions: Yes LLE Weight Bearing: Weight bearing as tolerated   Pertinent Vitals/Pain Pt w/o c/o pain, c/o nausea "last night and I was so tired" Pt reports "I'm feeling better now"    ADL  Eating/Feeding: Performed;Set up Where Assessed - Eating/Feeding: Chair Grooming: Performed;Wash/dry hands;Wash/dry face;Teeth care;Set up Where Assessed - Grooming: Supported sitting Upper Body Bathing: Performed;Chest;Right arm;Left arm;Abdomen;Set up Where Assessed - Upper Body Bathing: Supported sitting Lower Body Bathing: Performed;Minimal assistance Where Assessed - Lower Body Bathing: Supported sitting (Not standing secondary to pt getting blood) Upper Body Dressing: Performed;Minimal  assistance;Set up (Secondary to IV/lines) Where Assessed - Upper Body Dressing: Supported sitting Lower Body Dressing: Simulated;Moderate assistance (Anticipated, no OOB secondary to pt getting blood) Toilet Transfer: Other (comment) (TBA, Pt getting blood transfers performed, Pt using urinal, sitting up in chair) Toileting - Clothing Manipulation and Hygiene: Performed;Set up;Other (comment);Minimal assistance (Pt using urinal sitting up in chair) Where Assessed - Toileting Clothing Manipulation and Hygiene: Lean right and/or left;Other (comment) (Using urinal) Tub/Shower Transfer Method: Not assessed Transfers/Ambulation Related to ADLs: No out of chair transfers performed secondary to pt getting blood this am. Transfers TBA next visit. ADL Comments: Pt was educated in role of OT and recommendation of SNF rehab, pt agreeable to rehab, prefers [facility] if possible. Pt was up in chair getting blood, but agreeable OT training session for bathing/grooming from seated position.    OT Diagnosis: Generalized weakness;Acute pain  OT Problem List: Decreased strength;Decreased activity tolerance;Decreased knowledge of precautions;Decreased knowledge of use of DME or AE;Pain OT Treatment Interventions: Self-care/ADL training;DME and/or AE instruction;Patient/family education;Therapeutic activities;Balance training   OT Goals(Current goals can be found in the care plan section) Acute Rehab OT Goals Patient Stated Goal: "I want to go to [facility] and get this better" Time For Goal Achievement: 10/17/13 Potential to Achieve Goals: Fair  Visit Information  Last OT Received On: 10/03/13 History of Present Illness: Multiple readmissions due to infected L knee; now s/p arthrodesis       Prior Functioning     Home Living Family/patient expects to be discharged to:: Skilled nursing facility Living Arrangements: Spouse/significant other Additional Comments: has walk in shower Prior  Function Level of Independence: Independent with assistive device(s) Comments: prior to returning home after original Lt TKA  Communication Communication: No difficulties Dominant Hand: Left  Vision/Perception Vision - History Baseline Vision: Wears glasses only for reading Patient Visual Report: No change from baseline   Cognition  Cognition Arousal/Alertness: Awake/alert Behavior During Therapy: WFL for tasks assessed/performed Overall Cognitive Status: Within Functional Limits for tasks assessed    Extremity/Trunk Assessment Upper Extremity Assessment Upper Extremity Assessment: Overall WFL for tasks assessed Lower Extremity Assessment Lower Extremity Assessment: Defer to PT evaluation    Mobility Bed Mobility General bed mobility comments: Pt up in chair getting blood Transfers General transfer comment: Deferred, pt getting blood        Balance  TBA, pt getting blood. Sitting up in recliner today.   End of Session OT - End of Session Activity Tolerance: Other (comment) (Chair level eval performed secondary to pt getting blood. Pt stated "I'm starting to feel better now" after bathe/grooming seated.) Patient left: in chair;with call bell/phone within reach;with nursing/sitter in room  [place], [place] 10/03/2013, 12:18 PM

## 2013-10-03 NOTE — Progress Notes (Addendum)
Clinical Social Work Department CLINICAL SOCIAL WORK PLACEMENT NOTE 10/03/2013  Patient:  AXL, RODINO  Account Number:  [id number] Admit date:  10/01/2013  Clinical Social Worker:  Berton Mount, Latanya Presser  Date/time:  10/03/2013 12:00 N  Clinical Social Work is seeking post-discharge placement for this patient at the following level of care:   SKILLED NURSING   (*CSW will update this form in Epic as items are completed)   10/03/2013  Patient/family provided with [facility] Department of Clinical Social Work's list of facilities offering this level of care within the geographic area requested by the patient (or if unable, by the patient's family).  10/03/2013  Patient/family informed of their freedom to choose among providers that offer the needed level of care, that participate in Medicare, Medicaid or managed care program needed by the patient, have an available bed and are willing to accept the patient.  10/03/2013  Patient/family informed of [facility]' ownership interest in [facility], as well as of the fact that they are under no obligation to receive care at this facility.  PASARR submitted to EDS on Existing PASARR number received from EDS on   FL2 transmitted to all facilities in geographic area requested by pt/family on  10/03/2013 FL2 transmitted to all facilities within larger geographic area on   Patient informed that his/her managed care company has contracts with or will negotiate with  certain facilities, including the following:     Patient/family informed of bed offers received:  10/03/2013 Patient chooses bed at [facility] Physician recommends and patient chooses bed at    Patient to be transferred to [facility] on  10/04/2013 Patient to be transferred to facility by [facility]  The following physician request were entered in Epic:   Additional Comments:   [place], [place]

## 2013-10-04 LAB — TYPE AND SCREEN
ABO/RH(D): B POS
Antibody Screen: NEGATIVE
Unit division: 0

## 2013-10-04 LAB — BODY FLUID CULTURE: Culture: NO GROWTH

## 2013-10-04 LAB — CBC
HEMATOCRIT: 21.9 % — AB (ref 39.0–52.0)
Hemoglobin: 7.6 g/dL — ABNORMAL LOW (ref 13.0–17.0)
MCH: 27.8 pg (ref 26.0–34.0)
MCHC: 34.7 g/dL (ref 30.0–36.0)
MCV: 80.2 fL (ref 78.0–100.0)
Platelets: 186 10*3/uL (ref 150–400)
RBC: 2.73 MIL/uL — ABNORMAL LOW (ref 4.22–5.81)
RDW: 16 % — ABNORMAL HIGH (ref 11.5–15.5)
WBC: 12.8 10*3/uL — ABNORMAL HIGH (ref 4.0–10.5)

## 2013-10-04 LAB — GLUCOSE, CAPILLARY
GLUCOSE-CAPILLARY: 138 mg/dL — AB (ref 70–99)
Glucose-Capillary: 155 mg/dL — ABNORMAL HIGH (ref 70–99)

## 2013-10-04 NOTE — Progress Notes (Signed)
CSW (Clinical Social Worker) prepared pt dc packet and placed with shadow chart. CSW arranged non-emergent ambulance transport. Pt, pt nurse, and facility informed. CSW signing off.  Marlaine Arey, LCSWA [phone]  

## 2013-10-04 NOTE — Discharge Summary (Signed)
Patient ID: Edwin Shaffer MRN: [id number] DOB/AGE: May 31, 1946 68 y.o.  Admit date: 10/01/2013 Discharge date: 10/04/2013  Admission Diagnoses:  Principal Problem:   Joint arthrodesis status   Discharge Diagnoses:  Same  Past Medical History  Diagnosis Date  . Hypertension   . Cancer     vocal cord  . Arthritis   . Constipation   . Heart murmur   . Shortness of breath     with exertion  . Hypothyroidism   . Diabetes mellitus without complication     type 2 IDDM x29 years  . Enlarged prostate   . Osteomyelitis of knee region   . Complication of anesthesia   . PONV (postoperative nausea and vomiting)     Surgeries: Procedure(s): ARTHRODESIS KNEE on 10/01/2013   Consultants:    Discharged Condition: Improved  Hospital Course: Edwin Shaffer is an 68 y.o. male who was admitted 10/01/2013 for operative treatment ofJoint arthrodesis status. Patient has severe unremitting pain that affects sleep, daily activities, and work/hobbies. After pre-op clearance the patient was taken to the operating room on 10/01/2013 and underwent  Procedure(s): ARTHRODESIS KNEE.    Patient was given perioperative antibiotics: Anti-infectives   Start     Dose/Rate Route Frequency Ordered Stop   10/01/13 2200  doxycycline (VIBRA-TABS) tablet 100 mg     100 mg Oral 2 times daily 10/01/13 1604     10/01/13 1700  levofloxacin (LEVAQUIN) tablet 750 mg     750 mg Oral Daily 10/01/13 1604     10/01/13 0600  ceFAZolin (ANCEF) 3 g in dextrose 5 % 50 mL IVPB     3 g 160 mL/hr over 30 Minutes Intravenous On call to O.R. 09/30/13 1316 10/01/13 0745       Patient was given sequential compression devices, early ambulation, and chemoprophylaxis to prevent DVT.  Patient benefited maximally from hospital stay and there were no complications.  He will be weightbearing as tolerated and remain on doxycycline and Levaquin as part of his treatment for osteomyelitis that preexisted this admission.  Recent vital  signs: Patient Vitals for the past 24 hrs:  BP Temp Temp src Pulse Resp SpO2 Height Weight  10/04/13 0449 121/63 mmHg 98.8 F (37.1 C) Oral 99 18 98 % - -  10/03/13 2229 119/50 mmHg 98.2 F (36.8 C) Oral 64 19 97 % - -  10/03/13 2000 - - - - 16 96 % - -  10/03/13 1500 - - - - - - 6\' 2"  (1.88 m) 125.193 kg (276 lb)  10/03/13 1455 133/58 mmHg - - - - - - -  10/03/13 1444 79/30 mmHg 99 F (37.2 C) - 104 16 95 % - -  10/03/13 1400 155/79 mmHg 99.7 F (37.6 C) Oral 105 16 99 % - -  10/03/13 1305 133/70 mmHg 99.1 F (37.3 C) Oral 105 18 100 % - -  10/03/13 1205 136/72 mmHg 98.6 F (37 C) Oral 105 18 100 % - -  10/03/13 1105 143/73 mmHg 99.1 F (37.3 C) Oral 105 18 100 % - -  10/03/13 1030 117/97 mmHg 98.8 F (37.1 C) Oral 97 16 99 % - -     Recent laboratory studies:  Recent Labs  10/03/13 0555 10/04/13 0547  WBC 11.6* 12.8*  HGB 7.0* 7.6*  HCT 20.5* 21.9*  PLT 186 186  NA 131*  --   K 4.8  --   CL 99  --   CO2 18*  --   BUN  42*  --   CREATININE 1.49*  --   GLUCOSE 224*  --   CALCIUM 8.2*  --      Discharge Medications:     Medication List    STOP taking these medications       rifampin 300 MG capsule  Commonly known as:  RIFADIN      TAKE these medications       aspirin EC 325 MG tablet  Take 1 tablet (325 mg total) by mouth 2 (two) times daily.     cloNIDine 0.1 MG tablet  Commonly known as:  CATAPRES  Take 0.1 mg by mouth every 6 (six) hours as needed. For systolic b/p >627     diltiazem 360 MG 24 hr capsule  Commonly known as:  TIAZAC  Take 1 capsule (360 mg total) by mouth daily.     doxycycline 100 MG tablet  Commonly known as:  VIBRA-TABS  Take 1 tablet (100 mg total) by mouth 2 (two) times daily.     FARXIGA 10 MG Tabs  Generic drug:  Dapagliflozin Propanediol  Take 5 mg by mouth daily.     hydrochlorothiazide 25 MG tablet  Commonly known as:  HYDRODIURIL  Take 0.5 tablets (12.5 mg total) by mouth daily.     insulin glargine 100 UNIT/ML  injection  Commonly known as:  LANTUS  Inject 20 Units into the skin at bedtime.     insulin lispro protamine-lispro (75-25) 100 UNIT/ML Susp injection  Commonly known as:  HUMALOG 75/25 MIX  Inject 45 Units into the skin daily with breakfast.     levofloxacin 750 MG tablet  Commonly known as:  LEVAQUIN  Take 1 tablet (750 mg total) by mouth daily.     lisinopril 20 MG tablet  Commonly known as:  PRINIVIL,ZESTRIL  Take 1 tablet (20 mg total) by mouth 2 (two) times daily.     methocarbamol 500 MG tablet  Commonly known as:  ROBAXIN  Take 1 tablet (500 mg total) by mouth 2 (two) times daily with a meal.     nystatin cream  Commonly known as:  MYCOSTATIN  Apply 1 application topically 2 (two) times daily. Apply to groin after bathing     oxyCODONE-acetaminophen 5-325 MG per tablet  Commonly known as:  ROXICET  Take one tablet by mouth every 4 hours as needed for pain     polyethylene glycol packet  Commonly known as:  MIRALAX / GLYCOLAX  Take 17 g by mouth daily.     sennosides-docusate sodium 8.6-50 MG tablet  Commonly known as:  SENOKOT-S  Take 2 tablets by mouth daily.     tamsulosin 0.4 MG Caps capsule  Commonly known as:  FLOMAX  Take 0.4 mg by mouth daily.        Diagnostic Studies: Dg Knee Left Port  10/01/2013   CLINICAL DATA:  Left knee IM nail placement  EXAM: PORTABLE LEFT KNEE - 1-2 VIEW  COMPARISON:  None.  FINDINGS: There has been interval arthrodesis of the left knee with placement of an intra medullary nail in satisfactory position. There are postsurgical changes in the surrounding soft tissues and a surgical drain present. There are osseous fragments medially and laterally. There is no acute fracture or dislocation.  IMPRESSION: Interval left knee arthrodesis.   Electronically Signed   By: Kathreen Devoid   On: 10/01/2013 15:11    Disposition: 03-Skilled Nursing Facility      Discharge Orders   Future Orders Complete By  Expires   Call MD / Call 911  As  directed    Comments:     If you experience chest pain or shortness of breath, CALL 911 and be transported to the hospital emergency room.  If you develope a fever above 101 F, pus (white drainage) or increased drainage or redness at the wound, or calf pain, call your surgeon's office.   Constipation Prevention  As directed    Comments:     Drink plenty of fluids.  Prune juice may be helpful.  You may use a stool softener, such as Colace (over the counter) 100 mg twice a day.  Use MiraLax (over the counter) for constipation as needed.   Diet - low sodium heart healthy  As directed    Driving restrictions  As directed    Comments:     No driving for 2 weeks   Increase activity slowly as tolerated  As directed    Patient may shower  As directed    Comments:     You may shower without a dressing once there is no drainage.  Do not wash over the wound.  If drainage remains, cover wound with plastic wrap and then shower.   Weight bearing as tolerated  As directed    Comments:     Status post left knee arthrodesis, weightbearing as tolerated.   Questions:     Laterality:     Extremity:        Follow-up Information   Follow up with Kerin Salen, MD In 2 weeks.   Specialty:  Orthopedic Surgery   Contact information:   [place] [address] [phone]       Follow up with Kerin Salen, MD In 1 week.   Specialty:  Orthopedic Surgery   Contact information:   [place] [address] [phone]        Signed: Kerin Salen 10/04/2013, 9:39 AM

## 2013-10-04 NOTE — Progress Notes (Signed)
Patient ID: Edwin Shaffer, male   DOB: [birth year], 68 y.o.   MRN: [id number] PATIENT ID: Edwin Shaffer  MRN: [id number]  DOB/AGE:  Apr 03, 1946 / 68 y.o.  3 Days Post-Op Procedure(s) (LRB): ARTHRODESIS KNEE (Left)    PROGRESS NOTE Subjective: Patient is alert, oriented, no Nausea, no Vomiting, yes passing gas, yes Bowel Movement. Taking PO well. Denies SOB, Chest or Calf Pain. Using Incentive Spirometer, PAS in place. Ambulate 25 ft, Patient reports pain as 3 on 0-10 scale  .    Objective: Vital signs in last 24 hours: Filed Vitals:   10/03/13 1500 10/03/13 2000 10/03/13 2229 10/04/13 0449  BP:   119/50 121/63  Pulse:   64 99  Temp:   98.2 F (36.8 C) 98.8 F (37.1 C)  TempSrc:   Oral Oral  Resp:  16 19 18   Height: 6\' 2"  (1.88 m)     Weight: 125.193 kg (276 lb)     SpO2:  96% 97% 98%      Intake/Output from previous day: I/O last 3 completed shifts: In: 1460 [P.O.:360; I.V.:1050; IV Piggyback:50] Out: 2100 [Urine:2100]   Intake/Output this shift: Total I/O In: 240 [P.O.:240] Out: -    LABORATORY DATA:  Recent Labs  10/03/13 0555  10/03/13 1700 10/03/13 2224 10/04/13 0547 10/04/13 0628  WBC 11.6*  --   --   --  12.8*  --   HGB 7.0*  --   --   --  7.6*  --   HCT 20.5*  --   --   --  21.9*  --   PLT 186  --   --   --  186  --   NA 131*  --   --   --   --   --   K 4.8  --   --   --   --   --   CL 99  --   --   --   --   --   CO2 18*  --   --   --   --   --   BUN 42*  --   --   --   --   --   CREATININE 1.49*  --   --   --   --   --   GLUCOSE 224*  --   --   --   --   --   GLUCAP  --   < > 189* 169*  --  155*  CALCIUM 8.2*  --   --   --   --   --   < > = values in this interval not displayed.  Examination: Neurologically intact ABD soft Neurovascular intact Sensation intact distally Intact pulses distally Dorsiflexion/Plantar flexion intact Incision: no drainage No cellulitis present Compartment soft}  Assessment:   3 Days Post-Op Procedure(s)  (LRB): ARTHRODESIS KNEE (Left) ADDITIONAL DIAGNOSIS:  Diabetes and Hypertension  Plan: PT/OT WBAT, CPM 5/hrs day until ROM 0-90 degrees, then D/C CPM DVT Prophylaxis:  SCDx72hrs, ASA 325 mg BID x 2 weeks DISCHARGE PLAN: Skilled Nursing Facility/Rehab, patient desires -- in place where it stayed before. Is now essentially ready for discharge. DISCHARGE NEEDS: HHPT, HHRN, Walker and 3-in-1 comode seat     Zadaya Cuadra J 10/04/2013, 9:26 AM

## 2013-10-05 ENCOUNTER — Other Ambulatory Visit: Payer: Self-pay | Admitting: *Deleted

## 2013-10-05 ENCOUNTER — Encounter: Payer: Self-pay | Admitting: Adult Health

## 2013-10-05 ENCOUNTER — Non-Acute Institutional Stay: Payer: BC Managed Care – PPO | Admitting: Adult Health

## 2013-10-05 DIAGNOSIS — D649 Anemia, unspecified: Secondary | ICD-10-CM | POA: Insufficient documentation

## 2013-10-05 DIAGNOSIS — T84099A Other mechanical complication of unspecified internal joint prosthesis, initial encounter: Secondary | ICD-10-CM

## 2013-10-05 DIAGNOSIS — Z96659 Presence of unspecified artificial knee joint: Secondary | ICD-10-CM

## 2013-10-05 DIAGNOSIS — M869 Osteomyelitis, unspecified: Secondary | ICD-10-CM

## 2013-10-05 DIAGNOSIS — E1159 Type 2 diabetes mellitus with other circulatory complications: Secondary | ICD-10-CM

## 2013-10-05 DIAGNOSIS — M1712 Unilateral primary osteoarthritis, left knee: Secondary | ICD-10-CM

## 2013-10-05 DIAGNOSIS — K5900 Constipation, unspecified: Secondary | ICD-10-CM | POA: Insufficient documentation

## 2013-10-05 DIAGNOSIS — M1710 Unilateral primary osteoarthritis, unspecified knee: Secondary | ICD-10-CM

## 2013-10-05 DIAGNOSIS — IMO0002 Reserved for concepts with insufficient information to code with codable children: Secondary | ICD-10-CM

## 2013-10-05 DIAGNOSIS — T84018A Broken internal joint prosthesis, other site, initial encounter: Secondary | ICD-10-CM

## 2013-10-05 DIAGNOSIS — Z981 Arthrodesis status: Secondary | ICD-10-CM

## 2013-10-05 DIAGNOSIS — I10 Essential (primary) hypertension: Secondary | ICD-10-CM

## 2013-10-05 DIAGNOSIS — N400 Enlarged prostate without lower urinary tract symptoms: Secondary | ICD-10-CM

## 2013-10-05 DIAGNOSIS — N289 Disorder of kidney and ureter, unspecified: Secondary | ICD-10-CM | POA: Insufficient documentation

## 2013-10-05 MED ORDER — OXYCODONE-ACETAMINOPHEN 5-325 MG PO TABS: ORAL_TABLET | ORAL | Status: DC

## 2013-10-05 NOTE — Telephone Encounter (Signed)
[facility] 
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
## 2013-10-06 LAB — ANAEROBIC CULTURE

## 2013-10-08 ENCOUNTER — Non-Acute Institutional Stay: Payer: BC Managed Care – PPO | Admitting: Internal Medicine

## 2013-10-08 ENCOUNTER — Encounter: Payer: Self-pay | Admitting: Internal Medicine

## 2013-10-08 DIAGNOSIS — M1712 Unilateral primary osteoarthritis, left knee: Secondary | ICD-10-CM

## 2013-10-08 DIAGNOSIS — I10 Essential (primary) hypertension: Secondary | ICD-10-CM

## 2013-10-08 DIAGNOSIS — E1159 Type 2 diabetes mellitus with other circulatory complications: Secondary | ICD-10-CM

## 2013-10-08 DIAGNOSIS — IMO0002 Reserved for concepts with insufficient information to code with codable children: Secondary | ICD-10-CM

## 2013-10-08 DIAGNOSIS — D649 Anemia, unspecified: Secondary | ICD-10-CM

## 2013-10-08 DIAGNOSIS — M1710 Unilateral primary osteoarthritis, unspecified knee: Secondary | ICD-10-CM

## 2013-10-08 DIAGNOSIS — Z981 Arthrodesis status: Secondary | ICD-10-CM

## 2013-10-08 DIAGNOSIS — M869 Osteomyelitis, unspecified: Secondary | ICD-10-CM
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
## 2013-10-09 ENCOUNTER — Non-Acute Institutional Stay: Payer: BC Managed Care – PPO | Admitting: Adult Health

## 2013-10-09 ENCOUNTER — Encounter: Payer: Self-pay | Admitting: Adult Health

## 2013-10-09 DIAGNOSIS — N289 Disorder of kidney and ureter, unspecified: Secondary | ICD-10-CM

## 2013-10-09 DIAGNOSIS — D649 Anemia, unspecified: Secondary | ICD-10-CM

## 2013-10-09 MED ORDER — POLYSACCHARIDE IRON COMPLEX 150 MG PO CAPS: 150.0000 mg | ORAL_CAPSULE | Freq: Two times a day (BID) | ORAL | Status: AC
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
## 2013-10-11 ENCOUNTER — Encounter (HOSPITAL_COMMUNITY): Payer: Self-pay | Admitting: Orthopedic Surgery

## 2013-10-29 ENCOUNTER — Non-Acute Institutional Stay: Payer: BC Managed Care – PPO | Admitting: Adult Health

## 2013-10-29 DIAGNOSIS — I10 Essential (primary) hypertension: Secondary | ICD-10-CM

## 2013-10-29 DIAGNOSIS — K5900 Constipation, unspecified: Secondary | ICD-10-CM

## 2013-10-29 DIAGNOSIS — D649 Anemia, unspecified: Secondary | ICD-10-CM

## 2013-10-29 DIAGNOSIS — N400 Enlarged prostate without lower urinary tract symptoms: Secondary | ICD-10-CM

## 2013-10-29 DIAGNOSIS — E1159 Type 2 diabetes mellitus with other circulatory complications: Secondary | ICD-10-CM

## 2013-10-29 DIAGNOSIS — M1712 Unilateral primary osteoarthritis, left knee: Secondary | ICD-10-CM

## 2013-10-29 DIAGNOSIS — T8454XA Infection and inflammatory reaction due to internal left knee prosthesis, initial encounter: Secondary | ICD-10-CM

## 2013-10-29 DIAGNOSIS — M1710 Unilateral primary osteoarthritis, unspecified knee: Secondary | ICD-10-CM

## 2013-10-29 DIAGNOSIS — Z96659 Presence of unspecified artificial knee joint: Secondary | ICD-10-CM

## 2013-10-29 DIAGNOSIS — IMO0002 Reserved for concepts with insufficient information to code with codable children: Secondary | ICD-10-CM

## 2013-10-29 DIAGNOSIS — T84018A Broken internal joint prosthesis, other site, initial encounter: Secondary | ICD-10-CM

## 2013-10-29 DIAGNOSIS — T84099A Other mechanical complication of unspecified internal joint prosthesis, initial encounter: Secondary | ICD-10-CM

## 2013-10-29 DIAGNOSIS — T8450XA Infection and inflammatory reaction due to unspecified internal joint prosthesis, initial encounter: Secondary | ICD-10-CM

## 2013-11-02 ENCOUNTER — Other Ambulatory Visit: Payer: Self-pay | Admitting: *Deleted

## 2013-11-02 MED ORDER — OXYCODONE-ACETAMINOPHEN 5-325 MG PO TABS: ORAL_TABLET | ORAL | Status: DC

## 2013-11-02 NOTE — Telephone Encounter (Signed)
[facility] 

## 2013-11-05 ENCOUNTER — Encounter: Payer: Self-pay | Admitting: Adult Health

## 2013-11-05 ENCOUNTER — Other Ambulatory Visit: Payer: Self-pay | Admitting: *Deleted

## 2013-11-05 MED ORDER — OXYCODONE-ACETAMINOPHEN 5-325 MG PO TABS: ORAL_TABLET | ORAL | Status: AC

## 2013-11-05 NOTE — Telephone Encounter (Signed)
[facility] 
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
## 2013-11-07 ENCOUNTER — Encounter: Payer: Self-pay | Admitting: Infectious Disease

## 2013-11-07 ENCOUNTER — Ambulatory Visit (INDEPENDENT_AMBULATORY_CARE_PROVIDER_SITE_OTHER): Payer: BC Managed Care – PPO | Admitting: Infectious Disease

## 2013-11-07 VITALS — BP 102/63 | HR 81 | Temp 97.6°F | Wt 255.0 lb

## 2013-11-07 DIAGNOSIS — M869 Osteomyelitis, unspecified: Secondary | ICD-10-CM

## 2013-11-07 DIAGNOSIS — T8454XA Infection and inflammatory reaction due to internal left knee prosthesis, initial encounter: Secondary | ICD-10-CM

## 2013-11-07 DIAGNOSIS — Z96659 Presence of unspecified artificial knee joint: Secondary | ICD-10-CM

## 2013-11-07 DIAGNOSIS — T8450XA Infection and inflammatory reaction due to unspecified internal joint prosthesis, initial encounter: Secondary | ICD-10-CM

## 2013-11-07 LAB — C-REACTIVE PROTEIN: CRP: 1.4 mg/dL — ABNORMAL HIGH (ref <0.60)

## 2013-11-07 LAB — SEDIMENTATION RATE: Sed Rate: 85 mm/hr — ABNORMAL HIGH (ref 0–16)

## 2013-11-07 MED ORDER — DOXYCYCLINE HYCLATE 100 MG PO TABS: 100.0000 mg | ORAL_TABLET | Freq: Two times a day (BID) | ORAL | Status: DC

## 2013-11-07 NOTE — Progress Notes (Signed)
Subjective:    Patient ID: Edwin Shaffer, male    DOB: 26-Jul-1946, 67 y.o.   MRN: [id number]  HPI   WILLIOM Shaffer is a 68 y.o. male with PMHXof DM2 ('55), L TKR 04-23-13. He states he was home for 2 hours, then developed fever, wound drainage, wound swelling. He returned to the hospital, had his TKR resected and anbx impregnated spacer placed 05-23-13. His Cx were (-) at that time. He was d/c home on IV anbx (cefepime/vanco/rifampin) until 1 week ago. He was then changed to po doxy. He returns 11-18 with worsening swelling of his knee. He had fallen ~ 1.5 weeks pta. He was found on plain films to have a displaced spacer. On 11-19 he underwent removal of his spacer as well as necrotic bone/tissue. Repeat cultures were once again negatative.  He was broadened to vancomycin and merrem and rifamipin. He finished 8 weeks of postop IV antibiotics followed by po doxycycline.   He was recently admittted to [facility] and underwent: Left knee radical irrigation and debridement, patellectomy many, arthrodesis using the [employer] [place] nail on February 2nd, 2015. He has been on doxycycline and levofloxacin postoperatively. His pain in his knee is diminished greatly and he is learning how to give it about on his right leg. He is rehabilitating in skilled nursing facility he is without fevers chills or malaise.     Review of Systems  Constitutional: Negative for fever, chills, diaphoresis, activity change, appetite change, fatigue and unexpected weight change.  HENT: Negative for congestion, rhinorrhea, sinus pressure, sneezing, sore throat and trouble swallowing.   Eyes: Negative for photophobia and visual disturbance.  Respiratory: Negative for cough, chest tightness, shortness of breath, wheezing and stridor.   Cardiovascular: Negative for chest pain, palpitations and leg swelling.  Gastrointestinal: Negative for nausea, vomiting, abdominal pain, diarrhea, constipation, blood in stool, abdominal  distention and anal bleeding.  Genitourinary: Negative for dysuria, hematuria, flank pain and difficulty urinating.  Musculoskeletal: Positive for joint swelling. Negative for arthralgias, back pain and gait problem.  Skin: Positive for wound. Negative for color change, pallor and rash.  Neurological: Negative for dizziness, tremors, weakness and light-headedness.  Hematological: Negative for adenopathy. Does not bruise/bleed easily.  Psychiatric/Behavioral: Negative for behavioral problems, confusion, sleep disturbance, dysphoric mood, decreased concentration and agitation.       Objective:   Physical Exam  Constitutional: He is oriented to person, place, and time. He appears well-developed and well-nourished. No distress.  HENT:  Head: Normocephalic and atraumatic.  Mouth/Throat: Oropharynx is clear and moist. No oropharyngeal exudate.  Eyes: Conjunctivae and EOM are normal. Pupils are equal, round, and reactive to light. No scleral icterus.  Neck: Normal range of motion. Neck supple. No JVD present.  Cardiovascular: Normal rate, regular rhythm and normal heart sounds.  Exam reveals no gallop and no friction rub.   No murmur heard. Pulmonary/Chest: Effort normal and breath sounds normal. No respiratory distress. He has no wheezes. He has no rales. He exhibits no tenderness.  Abdominal: He exhibits no distension and no mass. There is no tenderness. There is no rebound and no guarding.  Musculoskeletal: He exhibits no edema and no tenderness.       Legs: Lymphadenopathy:    He has no cervical adenopathy.  Neurological: He is alert and oriented to person, place, and time. He exhibits normal muscle tone. Coordination normal.  Skin: Skin is warm and dry. He is not diaphoretic. No erythema. No pallor.     Psychiatric: He  has a normal mood and affect. His behavior is normal. Judgment and thought content normal.            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  --Discontinue levofloxacin  --Check sedimentation rate and C-reactive protein  --Return to clinic in 2 months time to see me.  I spent greater than 25 minutes with the patient including greater than 50% of time in face to face counsel of the patient and in coordination of their care.

## 2013-11-12 ENCOUNTER — Encounter: Payer: Self-pay | Admitting: Adult Health

## 2013-11-12 ENCOUNTER — Non-Acute Institutional Stay: Payer: BC Managed Care – PPO | Admitting: Adult Health

## 2013-11-12 DIAGNOSIS — I10 Essential (primary) hypertension: Secondary | ICD-10-CM

## 2013-11-12 DIAGNOSIS — IMO0002 Reserved for concepts with insufficient information to code with codable children: Secondary | ICD-10-CM

## 2013-11-12 DIAGNOSIS — T84018A Broken internal joint prosthesis, other site, initial encounter: Secondary | ICD-10-CM

## 2013-11-12 DIAGNOSIS — M1710 Unilateral primary osteoarthritis, unspecified knee: Secondary | ICD-10-CM

## 2013-11-12 DIAGNOSIS — T84099A Other mechanical complication of unspecified internal joint prosthesis, initial encounter: Secondary | ICD-10-CM

## 2013-11-12 DIAGNOSIS — M1712 Unilateral primary osteoarthritis, left knee: Secondary | ICD-10-CM

## 2013-11-12 DIAGNOSIS — S82143A Displaced bicondylar fracture of unspecified tibia, initial encounter for closed fracture: Secondary | ICD-10-CM

## 2013-11-12 DIAGNOSIS — Z96659 Presence of unspecified artificial knee joint: Secondary | ICD-10-CM

## 2013-11-12 DIAGNOSIS — S82109A Unspecified fracture of upper end of unspecified tibia, initial encounter for closed fracture: Secondary | ICD-10-CM

## 2013-11-12 DIAGNOSIS — Z981 Arthrodesis status: Secondary | ICD-10-CM

## 2013-11-12 DIAGNOSIS — E1159 Type 2 diabetes mellitus with other circulatory complications: Secondary | ICD-10-CM
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
## 2014-01-07 ENCOUNTER — Ambulatory Visit: Payer: BC Managed Care – PPO | Admitting: Infectious Disease

## 2014-01-23 ENCOUNTER — Encounter: Payer: Self-pay | Admitting: Infectious Disease

## 2014-01-23 ENCOUNTER — Other Ambulatory Visit: Payer: Self-pay | Admitting: Licensed Clinical Social Worker

## 2014-01-23 ENCOUNTER — Ambulatory Visit (INDEPENDENT_AMBULATORY_CARE_PROVIDER_SITE_OTHER): Payer: BC Managed Care – PPO | Admitting: Infectious Disease

## 2014-01-23 VITALS — BP 119/77 | HR 90 | Temp 97.9°F | Wt 276.0 lb

## 2014-01-23 DIAGNOSIS — Z96659 Presence of unspecified artificial knee joint: Secondary | ICD-10-CM

## 2014-01-23 DIAGNOSIS — T8454XA Infection and inflammatory reaction due to internal left knee prosthesis, initial encounter: Secondary | ICD-10-CM

## 2014-01-23 DIAGNOSIS — T8450XA Infection and inflammatory reaction due to unspecified internal joint prosthesis, initial encounter: Secondary | ICD-10-CM

## 2014-01-23 DIAGNOSIS — M109 Gout, unspecified: Secondary | ICD-10-CM

## 2014-01-23 LAB — SEDIMENTATION RATE: Sed Rate: 49 mm/hr — ABNORMAL HIGH (ref 0–16)

## 2014-01-23 MED ORDER — NYSTATIN 100000 UNIT/GM EX CREA: 1.0000 "application " | TOPICAL_CREAM | Freq: Two times a day (BID) | CUTANEOUS | Status: AC

## 2014-01-23 MED ORDER — SULFAMETHOXAZOLE-TMP DS 800-160 MG PO TABS: 1.0000 | ORAL_TABLET | Freq: Two times a day (BID) | ORAL | Status: DC

## 2014-01-23 NOTE — Progress Notes (Signed)
Subjective:    Patient ID: Edwin Shaffer, male    DOB: 06/25/1946, 68 y.o.   MRN: [id number]  HPI   Edwin Shaffer is a 68 y.o. male with PMHXof DM2 ('77), L TKR 04-23-13. He states he was home for 2 hours, then developed fever, wound drainage, wound swelling. He returned to the hospital, had his TKR resected and anbx impregnated spacer placed 05-23-13. His Cx were (-) at that time. He was d/c home on IV anbx (cefepime/vanco/rifampin) until 1 week ago. He was then changed to po doxy. He returns 11-18 with worsening swelling of his knee. He had fallen ~ 1.5 weeks pta. He was found on plain films to have a displaced spacer. On 11-19 he underwent removal of his spacer as well as necrotic bone/tissue. Repeat cultures were once again negatative.  He was broadened to vancomycin and merrem and rifamipin. He finished 8 weeks of postop IV antibiotics followed by po doxycycline.   He was recently admittted to [facility] and underwent: Left knee radical irrigation and debridement, patellectomy many, arthrodesis using the [employer] [place] nail on February 2nd, 2015. He had been on doxycycline and levofloxacin postoperatively. His pain in his knee had diminished greatly and he has been learning how to give it about on his right leg.  I narrowed him to doxycycline and he has done fine on this but the meds are costing him $99 a month.  I will therefore change him to Bactrim DS bid.     Review of Systems  Constitutional: Negative for fever, chills, diaphoresis, activity change, appetite change, fatigue and unexpected weight change.  HENT: Negative for congestion, rhinorrhea, sinus pressure, sneezing, sore throat and trouble swallowing.   Eyes: Negative for photophobia and visual disturbance.  Respiratory: Negative for cough, chest tightness, shortness of breath, wheezing and stridor.   Cardiovascular: Negative for chest pain, palpitations and leg swelling.  Gastrointestinal: Negative for nausea, vomiting,  abdominal pain, diarrhea, constipation, blood in stool, abdominal distention and anal bleeding.  Genitourinary: Negative for dysuria, hematuria, flank pain and difficulty urinating.  Musculoskeletal: Positive for joint swelling. Negative for arthralgias, back pain and gait problem.  Skin: Positive for wound. Negative for color change, pallor and rash.  Neurological: Negative for dizziness, tremors, weakness and light-headedness.  Hematological: Negative for adenopathy. Does not bruise/bleed easily.  Psychiatric/Behavioral: Negative for behavioral problems, confusion, sleep disturbance, dysphoric mood, decreased concentration and agitation.       Objective:   Physical Exam  Constitutional: He is oriented to person, place, and time. He appears well-developed and well-nourished. No distress.  HENT:  Head: Normocephalic and atraumatic.  Mouth/Throat: Oropharynx is clear and moist. No oropharyngeal exudate.  Eyes: Conjunctivae and EOM are normal. Pupils are equal, round, and reactive to light. No scleral icterus.  Neck: Normal range of motion. Neck supple. No JVD present.  Cardiovascular: Normal rate and regular rhythm.   Pulmonary/Chest: Effort normal. No respiratory distress. He has no wheezes.  Abdominal: He exhibits no distension.  Musculoskeletal: He exhibits no edema and no tenderness.       Legs: Lymphadenopathy:    He has no cervical adenopathy.  Neurological: He is alert and oriented to person, place, and time. He exhibits normal muscle tone. Coordination normal.  Skin: Skin is warm and dry. He is not diaphoretic. No erythema. No pallor.  Psychiatric: He has a normal mood and affect. His behavior is normal. Judgment and thought content normal.  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

## 2014-01-24 LAB — BASIC METABOLIC PANEL WITH GFR
BUN: 26 mg/dL — AB (ref 6–23)
CO2: 20 mEq/L (ref 19–32)
CREATININE: 1.25 mg/dL (ref 0.50–1.35)
Calcium: 9.1 mg/dL (ref 8.4–10.5)
Chloride: 105 mEq/L (ref 96–112)
GFR, EST AFRICAN AMERICAN: 69 mL/min
GFR, EST NON AFRICAN AMERICAN: 60 mL/min
Glucose, Bld: 250 mg/dL — ABNORMAL HIGH (ref 70–99)
Potassium: 4.4 mEq/L (ref 3.5–5.3)
Sodium: 135 mEq/L (ref 135–145)

## 2014-01-24 LAB — C-REACTIVE PROTEIN: CRP: 0.6 mg/dL — AB (ref <0.60)

## 2014-03-25 ENCOUNTER — Ambulatory Visit: Payer: BC Managed Care – PPO | Admitting: Infectious Disease

## 2014-04-03 ENCOUNTER — Ambulatory Visit: Payer: BC Managed Care – PPO | Admitting: Infectious Disease

## 2014-06-14 ENCOUNTER — Other Ambulatory Visit: Payer: Self-pay

## 2014-07-15 ENCOUNTER — Encounter: Payer: Self-pay | Admitting: Infectious Disease

## 2014-07-15 ENCOUNTER — Ambulatory Visit (INDEPENDENT_AMBULATORY_CARE_PROVIDER_SITE_OTHER): Payer: Medicare Other | Admitting: Infectious Disease

## 2014-07-15 VITALS — BP 113/65 | HR 87 | Temp 98.0°F | Wt 280.0 lb

## 2014-07-15 DIAGNOSIS — T8450XD Infection and inflammatory reaction due to unspecified internal joint prosthesis, subsequent encounter: Secondary | ICD-10-CM

## 2014-07-15 DIAGNOSIS — M869 Osteomyelitis, unspecified: Secondary | ICD-10-CM

## 2014-07-15 LAB — CBC WITH DIFFERENTIAL/PLATELET
Basophils Absolute: 0.1 10*3/uL (ref 0.0–0.1)
Basophils Relative: 1 % (ref 0–1)
Eosinophils Absolute: 0.2 10*3/uL (ref 0.0–0.7)
Eosinophils Relative: 2 % (ref 0–5)
HCT: 34.3 % — ABNORMAL LOW (ref 39.0–52.0)
Hemoglobin: 11.4 g/dL — ABNORMAL LOW (ref 13.0–17.0)
Lymphocytes Relative: 35 % (ref 12–46)
Lymphs Abs: 2.7 10*3/uL (ref 0.7–4.0)
MCH: 29.9 pg (ref 26.0–34.0)
MCHC: 33.2 g/dL (ref 30.0–36.0)
MCV: 90 fL (ref 78.0–100.0)
MONOS PCT: 11 % (ref 3–12)
MPV: 9.4 fL (ref 9.4–12.4)
Monocytes Absolute: 0.8 10*3/uL (ref 0.1–1.0)
NEUTROS PCT: 51 % (ref 43–77)
Neutro Abs: 3.9 10*3/uL (ref 1.7–7.7)
PLATELETS: 369 10*3/uL (ref 150–400)
RBC: 3.81 MIL/uL — ABNORMAL LOW (ref 4.22–5.81)
RDW: 14.8 % (ref 11.5–15.5)
WBC: 7.6 10*3/uL (ref 4.0–10.5)

## 2014-07-15 NOTE — Progress Notes (Signed)
Subjective:    Patient ID: Edwin Shaffer, male    DOB: 06-18-1946, 68 y.o.   MRN: [id number]  HPI   Edwin Shaffer is a 68 y.o. male with PMHXof DM2 ('37), L TKR 04-23-13. He states he was home for 2 hours, then developed fever, wound drainage, wound swelling. He returned to the hospital, had his TKR resected and anbx impregnated spacer placed 05-23-13. His Cx were (-) at that time. He was d/c home on IV anbx (cefepime/vanco/rifampin) until 1 week ago. He was then changed to po doxy. He returns 11-18 with worsening swelling of his knee. He had fallen ~ 1.5 weeks pta. He was found on plain films to have a displaced spacer. On 11-19 he underwent removal of his spacer as well as necrotic bone/tissue. Repeat cultures were once again negatative.  He was broadened to vancomycin and merrem and rifamipin. He finished 8 weeks of postop IV antibiotics followed by po doxycycline.   He was recently admittted to [facility] and underwent: Left knee radical irrigation and debridement, patellectomy many, arthrodesis using the [employer] [place] nail on February 2nd, 2015. He had been on doxycycline and levofloxacin postoperatively. His pain in his knee had diminished greatly and he has been learning how to give it about on his right leg.  I narrowed him to doxycycline and he has done fine on this but the meds are costing him $99 a month.  I will therefore change him to Bactrim DS bid and he has remained on this since. He has pain with weight bearing and prolonged standing but otherwise doing well. He has no fevers, chills or malaise.     Review of Systems  Constitutional: Negative for fever, chills, diaphoresis, activity change, appetite change, fatigue and unexpected weight change.  HENT: Negative for congestion, rhinorrhea, sinus pressure, sneezing, sore throat and trouble swallowing.   Eyes: Negative for photophobia and visual disturbance.  Respiratory: Negative for cough, chest tightness, shortness of breath,  wheezing and stridor.   Cardiovascular: Negative for chest pain, palpitations and leg swelling.  Gastrointestinal: Negative for nausea, vomiting, abdominal pain, diarrhea, constipation, blood in stool, abdominal distention and anal bleeding.  Genitourinary: Negative for dysuria, hematuria, flank pain and difficulty urinating.  Musculoskeletal: Positive for joint swelling. Negative for back pain, arthralgias and gait problem.  Skin: Positive for wound. Negative for color change, pallor and rash.  Neurological: Negative for dizziness, tremors, weakness and light-headedness.  Hematological: Negative for adenopathy. Does not bruise/bleed easily.  Psychiatric/Behavioral: Negative for behavioral problems, confusion, sleep disturbance, dysphoric mood, decreased concentration and agitation.       Objective:   Physical Exam  Constitutional: He is oriented to person, place, and time. He appears well-developed and well-nourished. No distress.  HENT:  Head: Normocephalic and atraumatic.  Mouth/Throat: No oropharyngeal exudate.  Eyes: Conjunctivae and EOM are normal.  Neck: Normal range of motion. Neck supple.  Cardiovascular: Normal rate, regular rhythm and normal heart sounds.  Exam reveals no gallop and no friction rub.   No murmur heard. Pulmonary/Chest: Effort normal. No respiratory distress. He has no wheezes.  Abdominal: He exhibits no distension.  Musculoskeletal: He exhibits no edema or tenderness.  Neurological: He is alert and oriented to person, place, and time. He exhibits normal muscle tone. Coordination abnormal.  Skin: Skin is warm and dry. He is not diaphoretic.  Psychiatric: He has a normal mood and affect. His behavior is normal. Judgment and thought content normal.     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

## 2014-07-16 LAB — BASIC METABOLIC PANEL WITH GFR
BUN: 27 mg/dL — AB (ref 6–23)
CALCIUM: 9.1 mg/dL (ref 8.4–10.5)
CO2: 22 mEq/L (ref 19–32)
CREATININE: 1.85 mg/dL — AB (ref 0.50–1.35)
Chloride: 105 mEq/L (ref 96–112)
GFR, EST NON AFRICAN AMERICAN: 37 mL/min — AB
GFR, Est African American: 43 mL/min — ABNORMAL LOW
GLUCOSE: 312 mg/dL — AB (ref 70–99)
Potassium: 5 mEq/L (ref 3.5–5.3)
Sodium: 136 mEq/L (ref 135–145)

## 2014-07-16 LAB — SEDIMENTATION RATE: Sed Rate: 45 mm/hr — ABNORMAL HIGH (ref 0–16)

## 2014-07-16 LAB — C-REACTIVE PROTEIN: CRP: <0.5 mg/dL (ref <0.60)

## 2014-08-16 ENCOUNTER — Observation Stay: Payer: Self-pay | Admitting: Internal Medicine

## 2014-08-16 LAB — URINALYSIS, COMPLETE
BACTERIA: NONE SEEN
BILIRUBIN, UR: NEGATIVE
Bilirubin,UR: NEGATIVE
Glucose,UR: >=500 mg/dL (ref 0–75)
Glucose,UR: >=500 mg/dL (ref 0–75)
Ketone: NEGATIVE
Ketone: NEGATIVE
Nitrite: NEGATIVE
Nitrite: NEGATIVE
Ph: 6 (ref 4.5–8.0)
Ph: 5 (ref 4.5–8.0)
Protein: 30
SPECIFIC GRAVITY: 1.023 (ref 1.003–1.030)
SPECIFIC GRAVITY: 1.02 (ref 1.003–1.030)
SQUAMOUS EPITHELIAL: NONE SEEN
Squamous Epithelial: NONE SEEN
WBC UR: 714 /HPF (ref 0–5)

## 2014-08-16 LAB — COMPREHENSIVE METABOLIC PANEL
ALT: 22 U/L
AST: 16 U/L (ref 15–37)
Albumin: 3.4 g/dL (ref 3.4–5.0)
Alkaline Phosphatase: 175 U/L — ABNORMAL HIGH
Anion Gap: 9 (ref 7–16)
BUN: 33 mg/dL — AB (ref 7–18)
Bilirubin,Total: 0.2 mg/dL (ref 0.2–1.0)
CHLORIDE: 106 mmol/L (ref 98–107)
Calcium, Total: 8.8 mg/dL (ref 8.5–10.1)
Co2: 19 mmol/L — ABNORMAL LOW (ref 21–32)
Creatinine: 2.09 mg/dL — ABNORMAL HIGH (ref 0.60–1.30)
EGFR (African American): 41 — ABNORMAL LOW
EGFR (Non-African Amer.): 34 — ABNORMAL LOW
GLUCOSE: 279 mg/dL — AB (ref 65–99)
Osmolality: 286 (ref 275–301)
Potassium: 5.1 mmol/L (ref 3.5–5.1)
Sodium: 134 mmol/L — ABNORMAL LOW (ref 136–145)
Total Protein: 7.6 g/dL (ref 6.4–8.2)

## 2014-08-16 LAB — CBC
HCT: 35.7 % — ABNORMAL LOW (ref 40.0–52.0)
HGB: 11.6 g/dL — AB (ref 13.0–18.0)
MCH: 30.2 pg (ref 26.0–34.0)
MCHC: 32.4 g/dL (ref 32.0–36.0)
MCV: 93 fL (ref 80–100)
Platelet: 307 10*3/uL (ref 150–440)
RBC: 3.84 10*6/uL — AB (ref 4.40–5.90)
RDW: 14.6 % — ABNORMAL HIGH (ref 11.5–14.5)
WBC: 8.1 10*3/uL (ref 3.8–10.6)

## 2014-08-17 LAB — BASIC METABOLIC PANEL
Anion Gap: 8 (ref 7–16)
BUN: 33 mg/dL — ABNORMAL HIGH (ref 7–18)
CHLORIDE: 110 mmol/L — AB (ref 98–107)
CREATININE: 1.93 mg/dL — AB (ref 0.60–1.30)
Calcium, Total: 8.5 mg/dL (ref 8.5–10.1)
Co2: 19 mmol/L — ABNORMAL LOW (ref 21–32)
EGFR (African American): 45 — ABNORMAL LOW
GFR CALC NON AF AMER: 37 — AB
Glucose: 263 mg/dL — ABNORMAL HIGH (ref 65–99)
Osmolality: 290 (ref 275–301)
Potassium: 4.9 mmol/L (ref 3.5–5.1)
SODIUM: 137 mmol/L (ref 136–145)

## 2014-08-17 LAB — CBC WITH DIFFERENTIAL/PLATELET
BASOS ABS: 0.1 10*3/uL (ref 0.0–0.1)
Basophil %: 0.8 %
Eosinophil #: 0.2 10*3/uL (ref 0.0–0.7)
Eosinophil %: 3.1 %
HCT: 34.1 % — ABNORMAL LOW (ref 40.0–52.0)
HGB: 10.8 g/dL — ABNORMAL LOW (ref 13.0–18.0)
Lymphocyte #: 2.5 10*3/uL (ref 1.0–3.6)
Lymphocyte %: 37 %
MCH: 29.9 pg (ref 26.0–34.0)
MCHC: 31.7 g/dL — AB (ref 32.0–36.0)
MCV: 94 fL (ref 80–100)
Monocyte #: 1 x10 3/mm (ref 0.2–1.0)
Monocyte %: 14.8 %
NEUTROS ABS: 3 10*3/uL (ref 1.4–6.5)
NEUTROS PCT: 44.3 %
Platelet: 268 10*3/uL (ref 150–440)
RBC: 3.62 10*6/uL — ABNORMAL LOW (ref 4.40–5.90)
RDW: 15.2 % — ABNORMAL HIGH (ref 11.5–14.5)
WBC: 6.8 10*3/uL (ref 3.8–10.6)

## 2014-08-19 LAB — URINE CULTURE

## 2014-08-26 ENCOUNTER — Emergency Department: Payer: Self-pay | Admitting: Emergency Medicine

## 2014-08-26 LAB — URINALYSIS, COMPLETE
Bilirubin,UR: NEGATIVE
Ketone: NEGATIVE
Nitrite: NEGATIVE
Ph: 6 (ref 4.5–8.0)
RBC,UR: 3860 /HPF (ref 0–5)
SPECIFIC GRAVITY: 1.022 (ref 1.003–1.030)
SQUAMOUS EPITHELIAL: NONE SEEN
WBC UR: 464 /HPF (ref 0–5)

## 2014-08-26 LAB — CBC
HCT: 35.2 % — ABNORMAL LOW (ref 40.0–52.0)
HGB: 11.7 g/dL — AB (ref 13.0–18.0)
MCH: 30.6 pg (ref 26.0–34.0)
MCHC: 33.2 g/dL (ref 32.0–36.0)
MCV: 92 fL (ref 80–100)
Platelet: 291 10*3/uL (ref 150–440)
RBC: 3.81 10*6/uL — ABNORMAL LOW (ref 4.40–5.90)
RDW: 14.7 % — AB (ref 11.5–14.5)
WBC: 8.1 10*3/uL (ref 3.8–10.6)

## 2014-08-26 LAB — COMPREHENSIVE METABOLIC PANEL
ALK PHOS: 178 U/L — AB
ALT: 29 U/L
ANION GAP: 8 (ref 7–16)
Albumin: 3.4 g/dL (ref 3.4–5.0)
BUN: 24 mg/dL — AB (ref 7–18)
Bilirubin,Total: 0.2 mg/dL (ref 0.2–1.0)
Calcium, Total: 8.6 mg/dL (ref 8.5–10.1)
Chloride: 108 mmol/L — ABNORMAL HIGH (ref 98–107)
Co2: 22 mmol/L (ref 21–32)
Creatinine: 1.6 mg/dL — ABNORMAL HIGH (ref 0.60–1.30)
GFR CALC AF AMER: 56 — AB
GFR CALC NON AF AMER: 46 — AB
GLUCOSE: 389 mg/dL — AB (ref 65–99)
OSMOLALITY: 296 (ref 275–301)
POTASSIUM: 4.5 mmol/L (ref 3.5–5.1)
SGOT(AST): 29 U/L (ref 15–37)
SODIUM: 138 mmol/L (ref 136–145)
Total Protein: 7.9 g/dL (ref 6.4–8.2)

## 2014-08-27 ENCOUNTER — Telehealth: Payer: Self-pay | Admitting: *Deleted

## 2014-08-27 NOTE — Telephone Encounter (Signed)
Patient called to advise that he has an appt with a Urologist for blood in his urine. He has had this send him to the ED twice since his last visit in November and wants to know if the antibiotic he is on could be the cause. Advised him it could be a number of reasons but that he will have a better view after his Urology appt. Advised him will let the doctor know what is going on with him and to call us back once he sees his Urologist.

## 2014-08-31 LAB — URINE CULTURE

## 2014-10-16 ENCOUNTER — Ambulatory Visit (INDEPENDENT_AMBULATORY_CARE_PROVIDER_SITE_OTHER): Payer: Medicare Other | Admitting: Infectious Disease

## 2014-10-16 ENCOUNTER — Encounter: Payer: Self-pay | Admitting: Infectious Disease

## 2014-10-16 DIAGNOSIS — M869 Osteomyelitis, unspecified: Secondary | ICD-10-CM

## 2014-10-16 LAB — BASIC METABOLIC PANEL WITH GFR
BUN: 19 mg/dL (ref 6–23)
CALCIUM: 9 mg/dL (ref 8.4–10.5)
CO2: 26 mEq/L (ref 19–32)
CREATININE: 1.28 mg/dL (ref 0.50–1.35)
Chloride: 101 mEq/L (ref 96–112)
GFR, EST NON AFRICAN AMERICAN: 58 mL/min — AB
GFR, Est African American: 67 mL/min
Glucose, Bld: 307 mg/dL — ABNORMAL HIGH (ref 70–99)
Potassium: 4.2 mEq/L (ref 3.5–5.3)
Sodium: 138 mEq/L (ref 135–145)

## 2014-10-16 LAB — C-REACTIVE PROTEIN: CRP: 0.9 mg/dL — AB (ref <0.60)

## 2014-10-16 NOTE — Progress Notes (Signed)
Subjective:    Patient ID: Edwin Shaffer, male    DOB: [birth year], 69 y.o.   MRN: [id number]  HPI   Edwin Shaffer is a 69 y.o. male with PMHXof DM2 ('35), L TKR 04-23-13. He states he was home for 2 hours, then developed fever, wound drainage, wound swelling. He returned to the hospital, had his TKR resected and anbx impregnated spacer placed 05-23-13. His Cx were (-) at that time. He was d/c home on IV anbx (cefepime/vanco/rifampin) until 1 week ago. He was then changed to po doxy. He returns 11-18 with worsening swelling of his knee. He had fallen ~ 1.5 weeks pta. He was found on plain films to have a displaced spacer. On 11-19 he underwent removal of his spacer as well as necrotic bone/tissue. Repeat cultures were once again negatative.  He was broadened to vancomycin and merrem and rifamipin. He finished 8 weeks of postop IV antibiotics followed by po doxycycline.   He was recently admittted to [facility] and underwent: Left knee radical irrigation and debridement, patellectomy many, arthrodesis using the [employer] [place] nail on February 2nd, 2015. He had been on doxycycline and levofloxacin postoperatively. His pain in his knee had diminished greatly and he has been learning how to give it about on his right leg.  I narrowed him to doxycycline and he has done fine on this but the meds are costing him $99 a month.  I herefore changed him to Bactrim DS bid and he has remained until this December 2015 and had hematuria. He went to [facility] ED who apparently thought the bleeding was related to his TMP/SMX so it was stopped.   He has had NO increase in his pain. He mainly has pain with changes in the weather or with h weight bearing and prolonged standing but otherwise doing well. He has no fevers, chills or malaise.     Review of Systems  Constitutional: Negative for fever, chills, diaphoresis, activity change, appetite change, fatigue and unexpected weight change.  HENT: Negative for congestion,  rhinorrhea, sinus pressure, sneezing, sore throat and trouble swallowing.   Eyes: Negative for photophobia and visual disturbance.  Respiratory: Negative for cough, chest tightness, shortness of breath, wheezing and stridor.   Cardiovascular: Negative for chest pain, palpitations and leg swelling.  Gastrointestinal: Negative for nausea, vomiting, abdominal pain, diarrhea, constipation, blood in stool, abdominal distention and anal bleeding.  Genitourinary: Negative for dysuria, hematuria, flank pain and difficulty urinating.  Musculoskeletal: Negative for back pain, joint swelling, arthralgias and gait problem.  Skin: Negative for color change, pallor, rash and wound.  Neurological: Negative for dizziness, tremors, weakness and light-headedness.  Hematological: Negative for adenopathy. Does not bruise/bleed easily.  Psychiatric/Behavioral: Negative for behavioral problems, confusion, sleep disturbance, dysphoric mood, decreased concentration and agitation.       Objective:   Physical Exam  Constitutional: He is oriented to person, place, and time. He appears well-developed and well-nourished. No distress.  HENT:  Head: Normocephalic and atraumatic.  Mouth/Throat: No oropharyngeal exudate.  Eyes: Conjunctivae and EOM are normal.  Neck: Normal range of motion. Neck supple.  Cardiovascular: Normal rate, regular rhythm and normal heart sounds.  Exam reveals no gallop and no friction rub.   No murmur heard. Pulmonary/Chest: Effort normal. No respiratory distress. He has no wheezes.  Abdominal: He exhibits no distension.  Musculoskeletal: He exhibits no edema or tenderness.  Neurological: He is alert and oriented to person, place, and time. He exhibits normal muscle tone. Coordination abnormal.  Skin: Skin is warm and dry. He is not diaphoretic.  Psychiatric: He has a normal mood and affect. His behavior is normal. Judgment and thought content normal.     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

## 2014-10-17 LAB — SEDIMENTATION RATE: Sed Rate: 71 mm/hr — ABNORMAL HIGH (ref 0–20)

## 2014-10-18 ENCOUNTER — Telehealth: Payer: Self-pay | Admitting: Licensed Clinical Social Worker

## 2014-10-18 NOTE — Telephone Encounter (Signed)
Dr. Tommy Medal wants the patient to return in 1 month instead of 3 months as originally scheduled due to elevated labs. I left a message on the patient's voicemail to call and schedule.

## 2014-11-29 DEATH — deceased

## 2014-12-21 NOTE — Discharge Summary (Signed)
PATIENT NAME:  Edwin Shaffer, Edwin Shaffer MR#:  [id number] DATE OF BIRTH:  1946/01/05  DATE OF ADMISSION:  08/16/2014 DATE OF DISCHARGE:  08/17/2014  DISCHARGE DIAGNOSES:  1.  Acute renal failure/chronic kidney disease stage 3 due to urinary tract infection or possibly Bactrim.  2.  Urinary tract infection.  3.  Hematuria.  4.  Hypertension.  5.  Diabetes mellitus.  6.  Left knee fusion, on chronic antibiotics   DISCHARGE MEDICATIONS:  1.  Cardizem 300 mg daily.  2.  Tamsulosin 0.4 mg oral daily.  3.  Humalog Mix 75/25 forty-five units in the morning.  4.  Lisinopril 20 mg daily.  5.  Acetaminophen/oxycodone 325/5 one tablet oral 2 times a day as needed for pain.  6.  Lantus 20 units subcutaneous at bedtime.  7.  Doxycycline 100 mg 2 times a day, 30 day supply given.  8.  Ciprofloxacin 250 mg oral 2 times a day for 3 days.   DISCHARGE INSTRUCTIONS: Carbohydrate-controlled diet. Activity as tolerated. Follow up with primary care physician in 1-2 weeks and nephrology and urology in 1-2 weeks for hematuria and CKD stage 3. The patient has been instructed to stop the Bactrim. Also, follow up with his infection disease, Dr. Roderic Scarce, in 2-4 weeks.   IMAGING STUDIES: Include an ultrasound of the bilateral kidneys showed no definite hydronephrosis, nonspecific circumferential bladder wall thickening, likely cystitis.   ADMITTING HISTORY AND PHYSICAL: Please see detailed H and P dictated previously, but in brief, a 69 year old [place] American male patient with history of chronic left knee fusion, on chronic Bactrim therapy, presented to the hospital complaining of hematuria. The patient was found to have acute renal failure/CKD with creatinine greater than 2 and admitted to hospitalist service.   HOSPITAL COURSE:  1.  Acute renal failure with CKD stage 3 with hematuria on Bactrim. The patient's Bactrim was stopped. Infectious disease was consulted who suggested that we change his Bactrim to doxycycline  for another month. The patient was started on IV fluids and his creatinine is slowly improving back to baseline. The patient has not had any further hematuria. Ultrasound of the kidneys showed no hydronephrosis, mild cystitis for which he is being started on Cipro for a total course of 5 days. The patient's cultures are negative. He is afebrile, normal white count, hematuria resolved. He will follow up with urology and nephrology for his hematuria and CKD stage 3 as an outpatient, not emergently, 1 or 2 weeks. 2.  Hypertension and diabetes. Fairly controlled during the hospital stay.   DISCHARGE PHYSICAL EXAMINATION: The patient's lungs sound clear. S1, S2 heard. No abdominal pain, no hematuria.   TIME SPENT ON DAY OF DISCHARGE IN DISCHARGE ACTIVITY: Forty minutes.    ____________________________ Leia Alf Henchy Mccauley, MD srs:TT D: 08/18/2014 15:14:04 ET T: 08/18/2014 21:31:28 ET JOB#: [id number]  cc: Alveta Heimlich R. Adrielle Polakowski, MD, <Dictator> Mikeal Hawthorne. Brynda Greathouse, MD Alcide Evener, MD in [place] [place] Lilian Kapur, MD Neita Carp MD ELECTRONICALLY SIGNED 08/19/2014 18:26

## 2014-12-21 NOTE — H&P (Signed)
PATIENT NAME:  Edwin Shaffer, Edwin Shaffer MR#:  [id number] DATE OF BIRTH:  [birth year]  DATE OF ADMISSION:  08/16/2014  PRIMARY CARE PHYSICIAN: Jaquelyn Bitter B. Brynda Greathouse, MD    PRIMARY INFECTIOUS DISEASE: Alcide Evener, MD, in [place].   CHIEF COMPLAINT: Hematuria.   HISTORY OF PRESENT ILLNESS: A 69 year old African American male patient with history of hypertension, diabetes, left knee surgery, with chronic infection on Bactrim for the past 4 months, presents to the Emergency Room with acute hematuria. The patient has been doing well, went to his gym where he did his regular exercise of walking. Nothing exertional. No trauma, no fall. At the end of his urination in the gym, he noticed that he had frank blood come out. The patient has had 2 bouts of urination here in the Emergency Room without any blood. On his blood work, his creatinine is elevated at 2.1, up from 1, and is being admitted to the hospitalist service for further management and work-up and treatment of his acute renal failure. No recent change in medications. He has not had any dysuria. No urinary retention. No blood clots. No diarrhea. Has had good oral intake. No recent change in medications. Has been on Bactrim for the last 4 months. He was on vancomycin, but this was stopped about 6 months prior.   He has been using the antibiotics for a chronic left knee infection.   PAST MEDICAL HISTORY: 1. Former smoker.  2. Vocal cord polyps.  3. Diabetes mellitus.  4. Hypertension.  5. Obesity.  6. Left knee surgery and chronic infection.   REVIEW OF SYSTEMS:  CONSTITUTIONAL: No fever, fatigue.  EYES: No blurry vision, pain, or redness. EARS, NOSE, AND THROAT: No tinnitus, ear pain, or hearing loss.  RESPIRATORY: No cough, wheezing, or hemoptysis. CARDIOVASCULAR: No chest pain, orthopnea, or edema.  GASTROINTESTINAL: No nausea, vomiting, diarrhea.  GENITOURINARY:  He has hematuria.  MUSCULOSKELETAL: No back pain, arthritis. Has  left knee pain  on and off.  NEUROLOGIC: No focal numbness, weakness, or seizure.  PSYCHIATRIC: No anxiety or depression.   FAMILY HISTORY: Mother had diabetes, hypertension, and obesity.   SOCIAL HISTORY: The patient does not smoke. No alcohol or illicit drug use. Ambulates with a cane.   CODE STATUS: FULL CODE.   HOME MEDICATIONS: 1. Flomax 0.4 mg daily.  2. Bactrim DS 800/160 one tablet 2 times a day.  3. Acetaminophen/oxycodone 325/5 one tablet 2 times a day as needed.  4. Diltiazem 300 mg extended-release daily. 5. Humalog 75/25 45 units in the morning and Lantus 20 units at bedtime.  6. Lisinopril 20 mg daily.   ALLERGIES: No known drug allergies,   PHYSICAL EXAMINATION: VITAL SIGNS: Temperature 97.7, pulse of 83, blood pressure 108/96, saturating 97% on room air.  GENERAL: Morbidly obese African American male patient lying in bed, seems comfortable, conversational, cooperative with exam.  PSYCHIATRIC: Alert and oriented x 3. Mood and affect are appropriate. Judgment intact.  HEENT: Head atraumatic, normocephalic. Oral mucosa moist and pink. External ears and nose normal. No pallor. No icterus. Pupils bilaterally equal and reactive to light.  NECK: Supple. No thyromegaly. No palpable lymph nodes. Trachea midline. No carotid bruits or JVD.  CARDIOVASCULAR: S1, S2, without any murmurs. Peripheral pulses 2+. No edema.  RESPIRATORY: Normal work of breathing. Clear to auscultation on both sides.  GASTROINTESTINAL: Soft abdomen, nontender. Bowel sounds present. No organomegaly palpable.  GENITOURINARY: No CVA tenderness or bladder distention. GENITAL: Examination normal. No ulceration, lesion, or mass found.  SKIN:  Warm and dry. No petechiae, rash, or ulcers.  MUSCULOSKELETAL: No joint swelling, redness, effusion of the large joints. Has scar from prior surgery of the left knee with foreign body, left knee unable to move.  NEUROLOGICAL: Motor strength 5/5 in upper and lower extremities. Sensation is  intact all over.  LYMPHATIC: No cervical lymphadenopathy.   LABORATORY STUDIES: Show glucose of 279, BUN 33, creatinine 2.09, sodium 134, potassium 5.1, chloride 106 bicarbonate 19. AST, ALT, bilirubin: Normal. WBC 8.1, hemoglobin 11.6, platelets of 307. Urinalysis shows 2+ leukocytes, 230 RBCs, 714 WBCs. No bacteria, nitrite negative. WBC clumps present.   IMAGING STUDIES: Ultrasound of the kidneys pending.   ASSESSMENT AND PLAN:  1. Hematuria with acute renal failure. The patient does not have any bacteria or nitrites in his urine, no dysuria, no fever, normal white count, unlikely to be urinary tract infection, considering he has been on Bactrim. At this point, there is a higher suspicion for possible cancer or prostate issues. Could also be renal lesions causing his hematuria. We will await his ultrasound of the kidney report. Start him on IV fluids, monitor creatinine closely, hold the Bactrim. I have discussed this with Dr. Ola Spurr of infectious disease for his recommendations regarding changing antibiotics from Bactrim to another antibiotic. He will get in touch with Dr. Roderic Scarce of infection in [place] for culture results. Also, hold lisinopril the patient is on. Repeat labs in the morning. Monitor for any further hematuria. Further progress depending on the ultrasound reports.  2. Chronic left knee infection. See above.  3. Hypertension. Hold lisinopril. Blood pressure is well controlled. Use IV p.r.n. medications as needed.  4. Insulin-dependent diabetes mellitus. Continue home dose of insulin and sliding scale insulin.  5. Deep vein thrombosis prophylaxis with SCDs.   CODE STATUS: FULL CODE.   Time spent on this case was 40 minutes.   ____________________________ Leia Alf Toree Edling, MD srs:mw D: 08/16/2014 16:30:00 ET T: 08/16/2014 17:10:02 ET JOB#: [id number]  cc: Alveta Heimlich R. Joleigh Mineau, MD, <Dictator> Mikeal Hawthorne. Brynda Greathouse, MD Alcide Evener, MD          [place]  MD ELECTRONICALLY SIGNED 08/18/2014 17:06

## 2015-01-14 ENCOUNTER — Ambulatory Visit: Payer: Medicare Other | Admitting: Infectious Disease

## 2015-02-24 ENCOUNTER — Other Ambulatory Visit: Payer: Self-pay

## 2015-08-15 IMAGING — US US RENAL KIDNEY
1 series · 14 of 25 positions shown · non-contrast
Comparison: CT 11/08/2010

CLINICAL DATA: Patient with acute renal injury.

EXAM:
RENAL/URINARY TRACT ULTRASOUND COMPLETE

[Series 1: us renal kidney · 0.27mm/px · 14 of 38 slices shown]
[im 1/38]
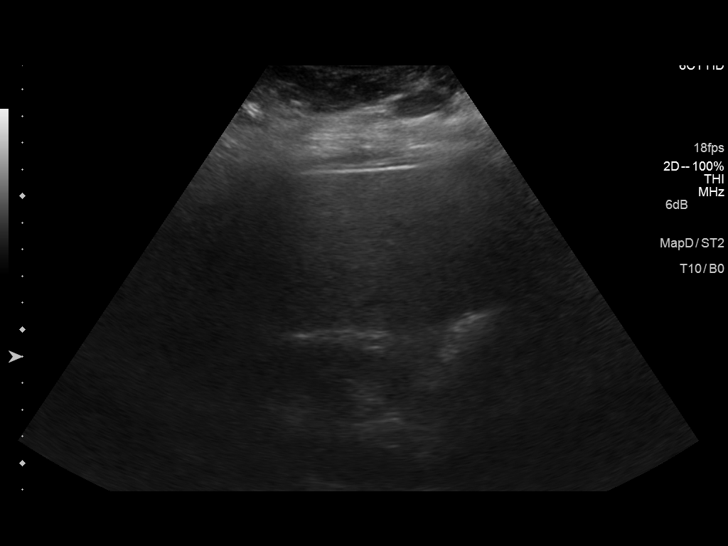
[im 4/38]
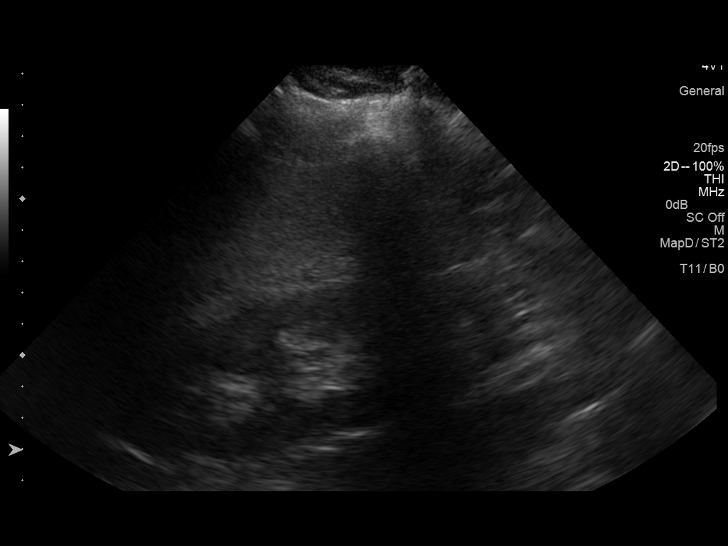
[im 7/38]
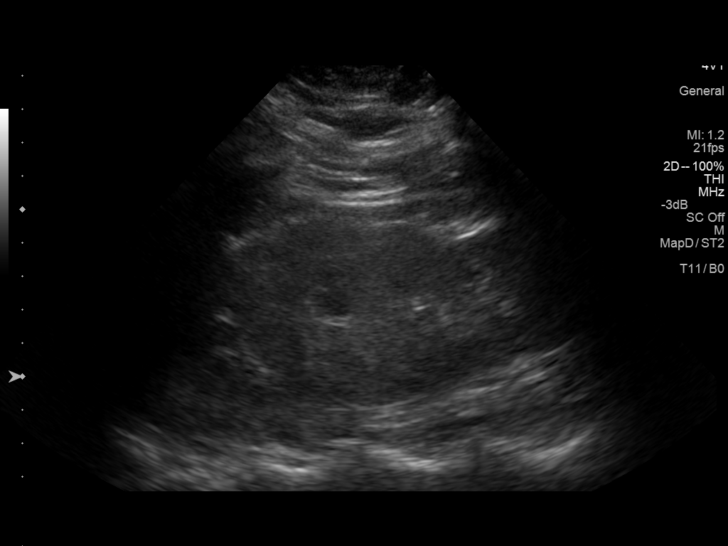
[im 10/38]
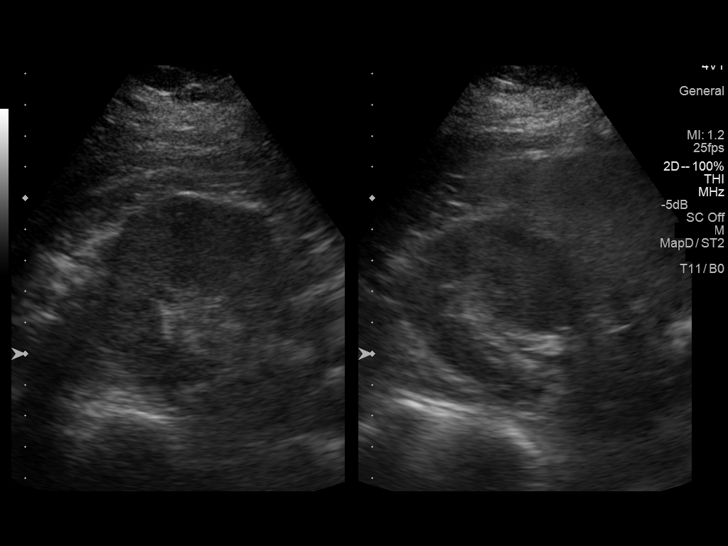
[im 13/38]
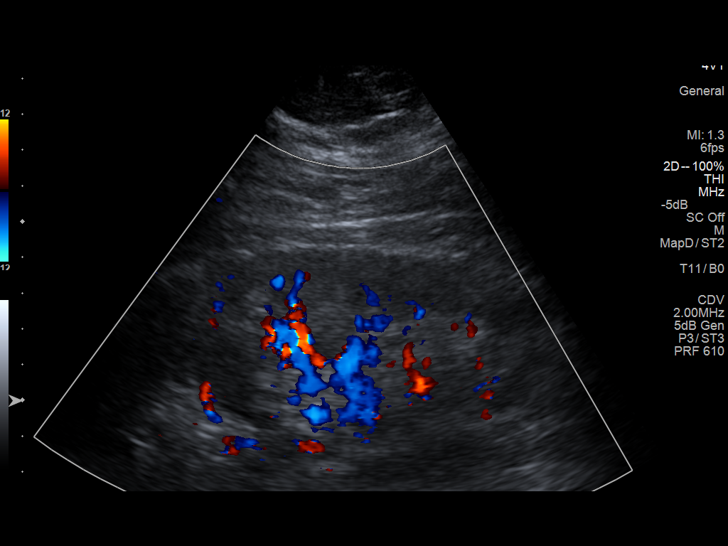
[im 14/38]
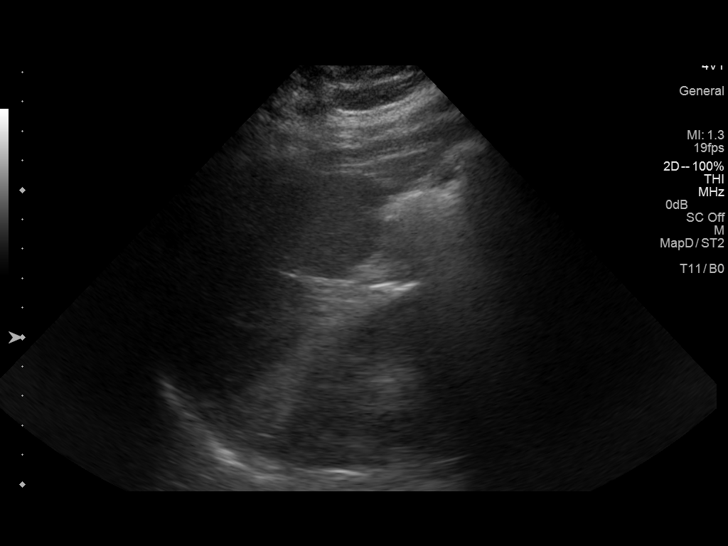
[im 17/38]
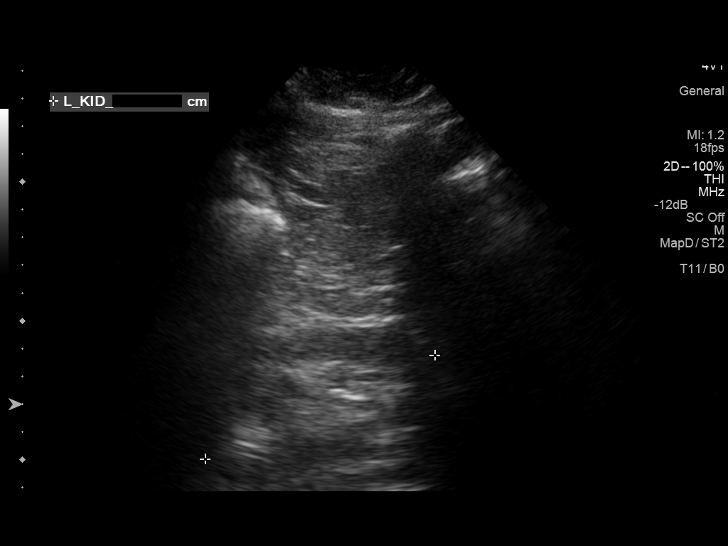
[im 21/38]
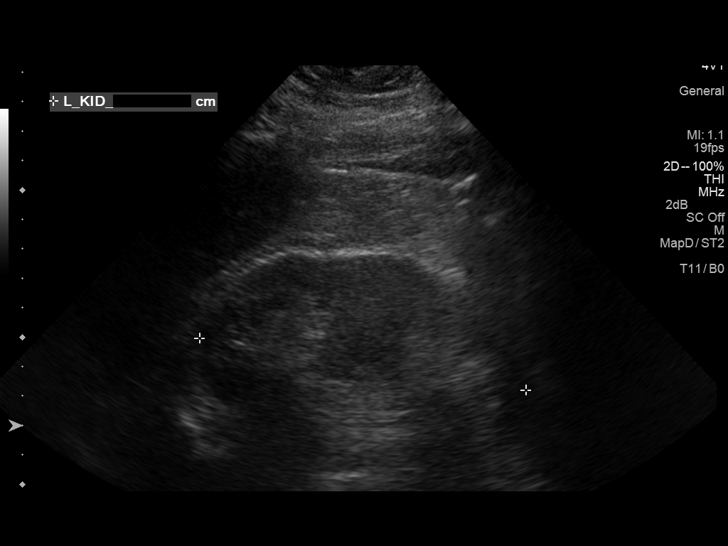
[im 24/38]
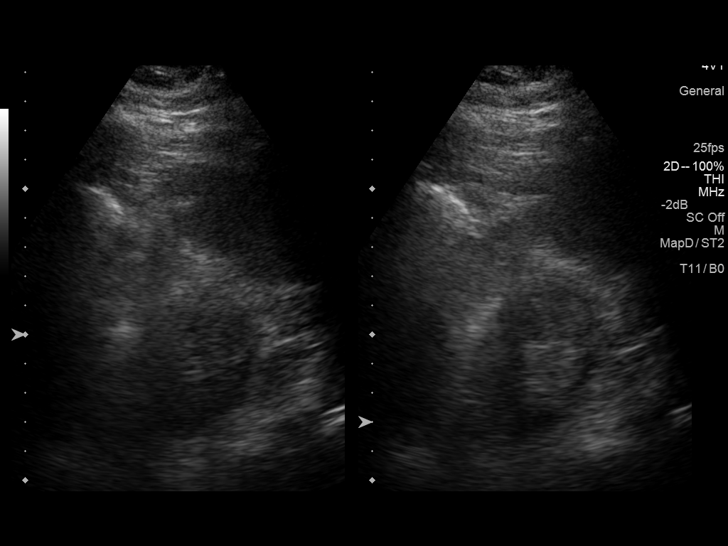
[im 25/38]
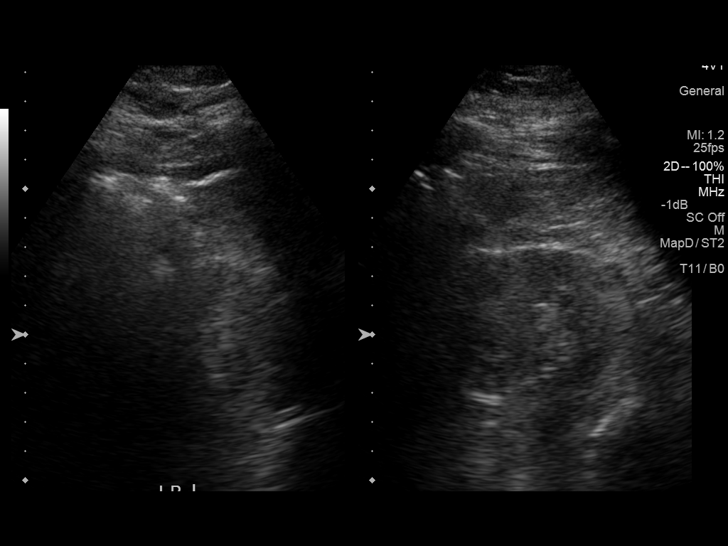
[im 28/38]
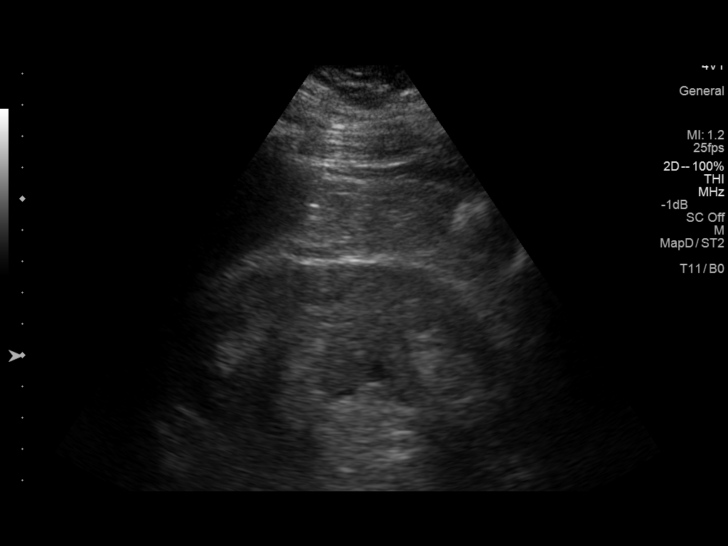
[im 31/38]
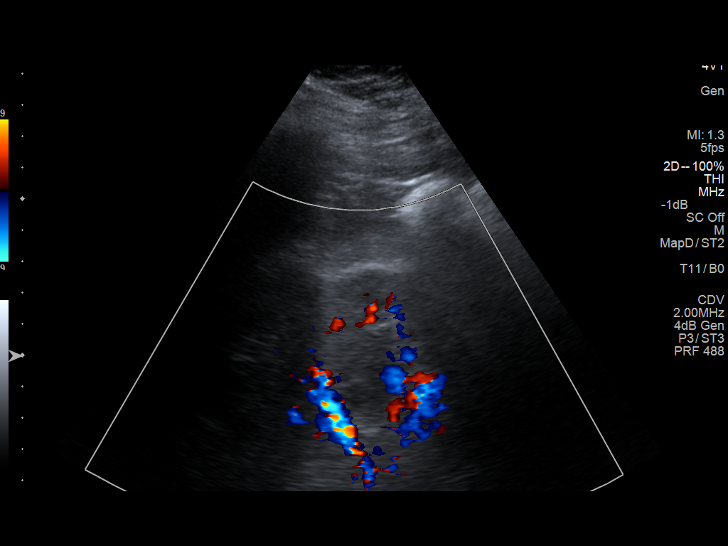
[im 34/38]
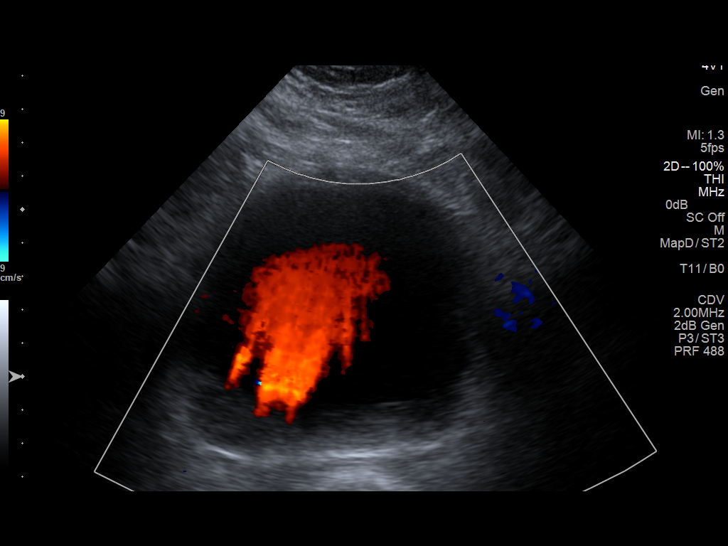
[im 38/38]
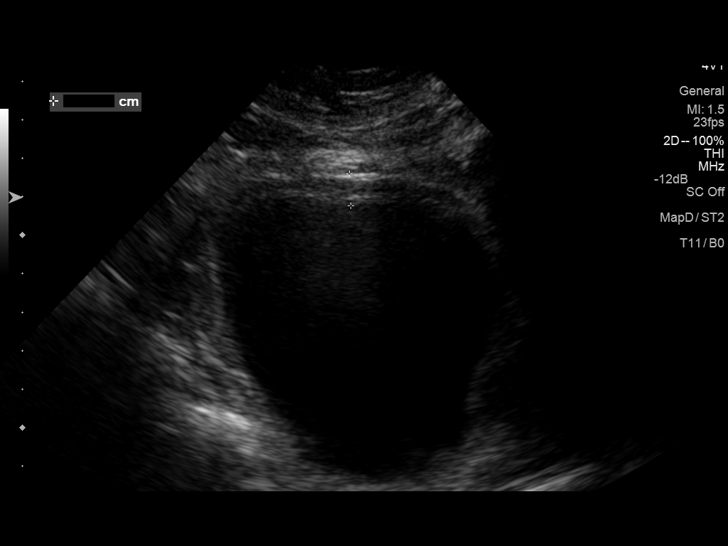

[14 of 25 positions shown; findings below may reference images not displayed]

FINDINGS: Limited evaluation secondary to body habitus.

Right Kidney:

Length: 12.8 cm.  No definite hydronephrosis.

Left Kidney:

Length: 11.2 cm.  No definite hydronephrosis.

Bladder:

Mild circumferential bladder wall thickening.
IMPRESSION: No definite hydronephrosis.

Nonspecific circumferential bladder wall thickening. Recommend
correlation with urinalysis to exclude the possibility of underlying
infection.

Limited evaluation secondary to body habitus.
